# Patient Record
Sex: Female | Born: 1950 | Race: White | Hispanic: No | Marital: Married | Smoking: Never smoker
Health system: Southern US, Community
[De-identification: ages and names within clinical notes are randomized; demographics above are authoritative.]

## PROBLEM LIST (undated history)

## (undated) DIAGNOSIS — E785 Hyperlipidemia, unspecified: Secondary | ICD-10-CM

## (undated) DIAGNOSIS — H269 Unspecified cataract: Secondary | ICD-10-CM

## (undated) DIAGNOSIS — I10 Essential (primary) hypertension: Secondary | ICD-10-CM

## (undated) DIAGNOSIS — M1990 Unspecified osteoarthritis, unspecified site: Secondary | ICD-10-CM

## (undated) DIAGNOSIS — R3915 Urgency of urination: Secondary | ICD-10-CM

## (undated) DIAGNOSIS — E0500 Thyrotoxicosis with diffuse goiter without thyrotoxic crisis or storm: Secondary | ICD-10-CM

## (undated) DIAGNOSIS — K5792 Diverticulitis of intestine, part unspecified, without perforation or abscess without bleeding: Secondary | ICD-10-CM

## (undated) DIAGNOSIS — J302 Other seasonal allergic rhinitis: Secondary | ICD-10-CM

## (undated) DIAGNOSIS — C801 Malignant (primary) neoplasm, unspecified: Secondary | ICD-10-CM

## (undated) HISTORY — DX: Thyrotoxicosis with diffuse goiter without thyrotoxic crisis or storm: E05.00

## (undated) HISTORY — DX: Unspecified osteoarthritis, unspecified site: M19.90

## (undated) HISTORY — DX: Unspecified cataract: H26.9

## (undated) HISTORY — DX: Hyperlipidemia, unspecified: E78.5

## (undated) HISTORY — DX: Essential (primary) hypertension: I10

## (undated) HISTORY — DX: Urgency of urination: R39.15

## (undated) HISTORY — DX: Other seasonal allergic rhinitis: J30.2

## (undated) HISTORY — PX: CATARACT EXTRACTION, BILATERAL: SHX1313

---
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
## 2000-06-20 ENCOUNTER — Encounter: Admission: RE | Admit: 2000-06-20 | Discharge: 2000-06-20 | Payer: Self-pay | Admitting: Obstetrics and Gynecology

## 2000-06-20 ENCOUNTER — Encounter: Payer: Self-pay | Admitting: Obstetrics and Gynecology

## 2001-07-05 ENCOUNTER — Encounter: Admission: RE | Admit: 2001-07-05 | Discharge: 2001-07-05 | Payer: Self-pay | Admitting: Obstetrics and Gynecology

## 2001-07-05 ENCOUNTER — Encounter: Payer: Self-pay | Admitting: Obstetrics and Gynecology

## 2002-07-09 ENCOUNTER — Encounter: Admission: RE | Admit: 2002-07-09 | Discharge: 2002-07-09 | Payer: Self-pay | Admitting: Obstetrics and Gynecology

## 2002-07-09 ENCOUNTER — Encounter: Payer: Self-pay | Admitting: Obstetrics and Gynecology

## 2003-07-22 ENCOUNTER — Encounter: Admission: RE | Admit: 2003-07-22 | Discharge: 2003-07-22 | Payer: Self-pay | Admitting: Obstetrics and Gynecology

## 2003-07-22 ENCOUNTER — Encounter: Payer: Self-pay | Admitting: Obstetrics and Gynecology

## 2003-09-23 ENCOUNTER — Ambulatory Visit (HOSPITAL_COMMUNITY): Admission: RE | Admit: 2003-09-23 | Discharge: 2003-09-23 | Payer: Self-pay | Admitting: Gastroenterology

## 2009-12-04 HISTORY — PX: CERVICAL BIOPSY  W/ LOOP ELECTRODE EXCISION: SUR135

## 2015-12-05 HISTORY — PX: COLONOSCOPY: SHX174

## 2015-12-05 HISTORY — PX: POLYPECTOMY: SHX149

## 2018-09-09 DIAGNOSIS — Z8619 Personal history of other infectious and parasitic diseases: Secondary | ICD-10-CM | POA: Insufficient documentation

## 2019-11-05 ENCOUNTER — Encounter: Payer: Self-pay | Admitting: Family Medicine

## 2019-11-05 ENCOUNTER — Other Ambulatory Visit: Payer: Self-pay

## 2019-11-05 ENCOUNTER — Ambulatory Visit (INDEPENDENT_AMBULATORY_CARE_PROVIDER_SITE_OTHER): Payer: Medicare Other | Admitting: Family Medicine

## 2019-11-05 VITALS — BP 130/74 | HR 74 | Temp 98.1°F | Ht 69.0 in | Wt 159.1 lb

## 2019-11-05 DIAGNOSIS — R319 Hematuria, unspecified: Secondary | ICD-10-CM | POA: Diagnosis not present

## 2019-11-05 DIAGNOSIS — E785 Hyperlipidemia, unspecified: Secondary | ICD-10-CM

## 2019-11-05 DIAGNOSIS — Z8639 Personal history of other endocrine, nutritional and metabolic disease: Secondary | ICD-10-CM

## 2019-11-05 DIAGNOSIS — R300 Dysuria: Secondary | ICD-10-CM

## 2019-11-05 DIAGNOSIS — I10 Essential (primary) hypertension: Secondary | ICD-10-CM

## 2019-11-05 DIAGNOSIS — R109 Unspecified abdominal pain: Secondary | ICD-10-CM

## 2019-11-05 DIAGNOSIS — R3915 Urgency of urination: Secondary | ICD-10-CM

## 2019-11-05 LAB — POCT URINALYSIS DIPSTICK
Bilirubin, UA: NEGATIVE
Blood, UA: POSITIVE
Glucose, UA: NEGATIVE
Ketones, UA: NEGATIVE
Nitrite, UA: NEGATIVE
Protein, UA: NEGATIVE
Spec Grav, UA: 1.01 (ref 1.010–1.025)
Urobilinogen, UA: 0.2 E.U./dL
pH, UA: 6.5 (ref 5.0–8.0)

## 2019-11-05 MED ORDER — MYRBETRIQ 50 MG PO TB24: 50.0000 mg | ORAL_TABLET | Freq: Every day | ORAL | 11 refills | Status: DC

## 2019-11-05 NOTE — Progress Notes (Signed)
Subjective:     Patient ID: Caitlin Bowers, female   DOB: [birth year], 68 y.o.   MRN: [id number]  HPI   Ms. Macaraeg is seen with acute issue of left flank/lower lumbar pain.  This started sometime over the weekend.  There was no injury.  She has a achy pain left flank which is worse with lying and changing positions and has been waking her some at night.  7 out of 10 in severity at its worse.  She denies any past history of back difficulties.  Is not aware of any recent injury.  No radiculitis symptoms.  Pain starts in the left flank does not radiate anteriorly.  She denies any urinary symptoms.  No cough.  No fevers or chills.  No lower extremity weakness.  No gross hematuria.  No burning with urination.  No hx of kidney stones.   She took some Tylenol with mild relief.  Has not tried any heat or ice.  She just moved here recently from [facility].  They also had another home in [place].  She had planned to establish care here in March when she is due for her yearly physical.  She has history of hypertension, urinary urgency, hyperlipidemia, and past history of Graves' disease.  Her Graves' disease was treated medically several years ago.  Past Medical History:  Diagnosis Date  . Graves disease    Past Surgical History:  Procedure Laterality Date  . CESAREAN SECTION     1979  . TONSILLECTOMY  1957    reports that she has never smoked. She has never used smokeless tobacco. She reports current alcohol use of about 3.0 standard drinks of alcohol per week. She reports that she does not use drugs. family history is not on file. Allergies  Allergen Reactions  . Penicillins Hives  . Sulfa Antibiotics Hives     Review of Systems  Constitutional: Negative for appetite change, chills, fever and unexpected weight change.  Respiratory: Negative for cough.   Cardiovascular: Negative for chest pain.  Gastrointestinal: Negative for abdominal pain.  Genitourinary: Positive for flank pain.  Negative for difficulty urinating, dysuria and hematuria.  Musculoskeletal: Positive for back pain.  Neurological: Negative for weakness and numbness.       Objective:   Physical Exam Vitals signs reviewed.  Constitutional:      Appearance: Normal appearance.  Cardiovascular:     Rate and Rhythm: Normal rate and regular rhythm.  Pulmonary:     Effort: Pulmonary effort is normal.     Breath sounds: Normal breath sounds.  Abdominal:     General: Bowel sounds are normal.     Palpations: Abdomen is soft. There is no mass.     Tenderness: There is no abdominal tenderness. There is no guarding or rebound.  Musculoskeletal:     Right lower leg: No edema.     Left lower leg: No edema.     Comments: She has some mild tenderness left lower lumbar region.  Pain is exacerbated with lying to supine change in position  Neurological:     General: No focal deficit present.     Mental Status: She is alert.     Motor: No weakness.        Assessment:     #1 Acute left lumbar back pain.  Worse with position change.  Suspect musculoskeletal origin.  #2 urine dip does show small leukocytes and positive blood of uncertain significance.  Doubt infection or kidney stone clinically.  Plan:     -Check urine dipstick (as above) -urine sent for culture and microscopy -consider OTC Advil of Aleve for back pain -try topical heat -consider cautious use of low dose muscle relaxer if symptoms persist. -she plans to establish with her new primary in a few months.   -refilled Myrbetriq until she sees new primary.  Eulas Post MD [facility] at [facility]

## 2019-11-05 NOTE — Patient Instructions (Signed)
Acute Back Pain, Adult Acute back pain is sudden and usually short-lived. It is often caused by an injury to the muscles and tissues in the back. The injury may result from:  A muscle or ligament getting overstretched or torn (strained). Ligaments are tissues that connect bones to each other. Lifting something improperly can cause a back strain.  Wear and tear (degeneration) of the spinal disks. Spinal disks are circular tissue that provides cushioning between the bones of the spine (vertebrae).  Twisting motions, such as while playing sports or doing yard work.  A hit to the back.  Arthritis. You may have a physical exam, lab tests, and imaging tests to find the cause of your pain. Acute back pain usually goes away with rest and home care. Follow these instructions at home: Managing pain, stiffness, and swelling  Take over-the-counter and prescription medicines only as told by your health care provider.  Your health care provider may recommend applying ice during the first 24-48 hours after your pain starts. To do this: ? Put ice in a plastic bag. ? Place a towel between your skin and the bag. ? Leave the ice on for 20 minutes, 2-3 times a day.  If directed, apply heat to the affected area as often as told by your health care provider. Use the heat source that your health care provider recommends, such as a moist heat pack or a heating pad. ? Place a towel between your skin and the heat source. ? Leave the heat on for 20-30 minutes. ? Remove the heat if your skin turns bright red. This is especially important if you are unable to feel pain, heat, or cold. You have a greater risk of getting burned. Activity   Do not stay in bed. Staying in bed for more than 1-2 days can delay your recovery.  Sit up and stand up straight. Avoid leaning forward when you sit, or hunching over when you stand. ? If you work at a desk, sit close to it so you do not need to lean over. Keep your chin tucked  in. Keep your neck drawn back, and keep your elbows bent at a right angle. Your arms should look like the letter "L." ? Sit high and close to the steering wheel when you drive. Add lower back (lumbar) support to your car seat, if needed.  Take short walks on even surfaces as soon as you are able. Try to increase the length of time you walk each day.  Do not sit, drive, or stand in one place for more than 30 minutes at a time. Sitting or standing for long periods of time can put stress on your back.  Do not drive or use heavy machinery while taking prescription pain medicine.  Use proper lifting techniques. When you bend and lift, use positions that put less stress on your back: ? Bend your knees. ? Keep the load close to your body. ? Avoid twisting.  Exercise regularly as told by your health care provider. Exercising helps your back heal faster and helps prevent back injuries by keeping muscles strong and flexible.  Work with a physical therapist to make a safe exercise program, as recommended by your health care provider. Do any exercises as told by your physical therapist. Lifestyle  Maintain a healthy weight. Extra weight puts stress on your back and makes it difficult to have good posture.  Avoid activities or situations that make you feel anxious or stressed. Stress and anxiety increase muscle   tension and can make back pain worse. Learn ways to manage anxiety and stress, such as through exercise. General instructions  Sleep on a firm mattress in a comfortable position. Try lying on your side with your knees slightly bent. If you lie on your back, put a pillow under your knees.  Follow your treatment plan as told by your health care provider. This may include: ? Cognitive or behavioral therapy. ? Acupuncture or massage therapy. ? Meditation or yoga. Contact a health care provider if:  You have pain that is not relieved with rest or medicine.  You have increasing pain going down  into your legs or buttocks.  Your pain does not improve after 2 weeks.  You have pain at night.  You lose weight without trying.  You have a fever or chills. Get help right away if:  You develop new bowel or bladder control problems.  You have unusual weakness or numbness in your arms or legs.  You develop nausea or vomiting.  You develop abdominal pain.  You feel faint. Summary  Acute back pain is sudden and usually short-lived.  Use proper lifting techniques. When you bend and lift, use positions that put less stress on your back.  Take over-the-counter and prescription medicines and apply heat or ice as directed by your health care provider. This information is not intended to replace advice given to you by your health care provider. Make sure you discuss any questions you have with your health care provider. Document Released: 11/20/2005 Document Revised: 03/11/2019 Document Reviewed: 07/04/2017 Elsevier Patient Education  [employer].  Try some Advil or Aleve and heat  We are sending urine for microscopy and culture  Let me know if you have any fever, gross hematuria, or burning with urination.

## 2019-11-06 LAB — URINALYSIS, ROUTINE W REFLEX MICROSCOPIC
Bilirubin Urine: NEGATIVE
Ketones, ur: NEGATIVE
Nitrite: NEGATIVE
Specific Gravity, Urine: 1.01 (ref 1.000–1.030)
Total Protein, Urine: NEGATIVE
Urine Glucose: NEGATIVE
Urobilinogen, UA: 0.2 (ref 0.0–1.0)
pH: 7 (ref 5.0–8.0)

## 2019-11-07 LAB — URINE CULTURE
MICRO NUMBER:: 1155661
SPECIMEN QUALITY:: ADEQUATE

## 2019-11-11 ENCOUNTER — Other Ambulatory Visit: Payer: Self-pay

## 2019-11-11 MED ORDER — METHOCARBAMOL 500 MG PO TABS: 500.0000 mg | ORAL_TABLET | Freq: Four times a day (QID) | ORAL | 0 refills | Status: DC | PRN

## 2019-11-18 ENCOUNTER — Other Ambulatory Visit: Payer: Self-pay

## 2019-11-18 ENCOUNTER — Telehealth (INDEPENDENT_AMBULATORY_CARE_PROVIDER_SITE_OTHER): Payer: Medicare Other | Admitting: Family Medicine

## 2019-11-18 ENCOUNTER — Telehealth: Payer: Self-pay

## 2019-11-18 ENCOUNTER — Encounter: Payer: Self-pay | Admitting: Family Medicine

## 2019-11-18 DIAGNOSIS — R109 Unspecified abdominal pain: Secondary | ICD-10-CM

## 2019-11-18 NOTE — Telephone Encounter (Signed)
Spoke to Caitlin Bowers and she stated she has a lot of back pain. Nausea, abdominal pain and nasal congestion. Sx started 2 weeks ago. Just noticed that pt do not have a PCP listed. Pt stated she was suppose to have had a CPE and establish care in March but pt stated she got sick before then. Pt stated she saw Dr.Burchette but never seen Dr.Koberlien. Pt advised that I will call back to see if appt. Is appropriate.

## 2019-11-18 NOTE — Telephone Encounter (Signed)
Spoke to Glass blower/designer and advised that pt can do virtual with Dr.Kim. Pt notified of update.

## 2019-11-18 NOTE — Telephone Encounter (Signed)
Copied from [place] [id number]. Topic: Appointment Scheduling - Prior Auth Required for Appointment >> Nov 18, 2019  9:00 AM Robina Ade, Helene Kelp D wrote: No appointment has been scheduled. Patient is requesting work-in appointment for back pain. Per scheduling protocol, this appointment requires a prior authorization prior to scheduling.  Route to department's PEC pool.

## 2019-11-18 NOTE — Telephone Encounter (Signed)
Pt has been scheduled.  °

## 2019-11-18 NOTE — Telephone Encounter (Signed)
Copied from [place] [phone]. Topic: Appointment Scheduling - Prior Auth Required for Appointment >> Nov 18, 2019  9:00 AM Robina Ade, Helene Kelp D wrote: No appointment has been scheduled. Patient is requesting work-in appointment for back pain. Per scheduling protocol, this appointment requires a prior authorization prior to scheduling.  Route to department's PEC pool.

## 2019-11-18 NOTE — Telephone Encounter (Signed)
Copied from [place] [id number]. Topic: Appointment Scheduling - Prior Auth Required for Appointment >> Nov 18, 2019  9:00 AM Robina Ade, Helene Kelp D wrote: No appointment has been scheduled. Patient is requesting work-in appointment for back pain. Per scheduling protocol, this appointment requires a prior authorization prior to scheduling.  Route to department's PEC pool.

## 2019-11-18 NOTE — Progress Notes (Signed)
Virtual Visit via Video Note  I connected with Caitlin Bowers  on 11/18/19 at 12:00 PM EST by a video enabled telemedicine application and verified that I am speaking with the correct person using two identifiers.  Location patient: home Location provider:work or home office Persons participating in the virtual visit: patient, provider  I discussed the limitations of evaluation and management by telemedicine and the availability of in person appointments. The patient expressed understanding and agreed to proceed.   HPI:  Acute visit for L flank pain: -started 2.5 weeks ago -she has a PCP appt coming up -this is intermittent, severe at times - so bad at times that she can't eat, sometimes pain is so bad it is accompanied by nausea -worse with sitting, better with standing -denies pain with urination, gross hematuria, fevers, bowel changes, diarrhea, vomiting, constipation, history of kidney stones, resp symptoms, cough -was seen by another provider and has tried nsaids and muscle relaxer which she reports have not touched the pain -she is worried she needs to be seen again and get labs and maybe imaging  ROS: See pertinent positives and negatives per HPI.  Past Medical History:  Diagnosis Date  . Graves disease   . Hyperlipidemia   . Hypertension   . Urgency of urination     Past Surgical History:  Procedure Laterality Date  . CESAREAN SECTION     1979  . TONSILLECTOMY  1957    Family History  Adopted: Yes  Family history unknown: Yes    SOCIAL HX: see hpi   Current Outpatient Medications:  .  atenolol (TENORMIN) 25 MG tablet, , Disp: , Rfl:  .  atorvastatin (LIPITOR) 40 MG tablet, Take 40 mg by mouth at bedtime., Disp: , Rfl:  .  Calcium Carb-Cholecalciferol (CALCIUM 1000 + D PO), Take 2 tablets by mouth daily., Disp: , Rfl:  .  estradiol (ESTRACE) 0.1 MG/GM vaginal cream, Place 1 Applicatorful vaginally 3 (three) times a week., Disp: , Rfl:  .  GLUCOSAMINE-CHONDROITIN  PO, Take 2 capsules by mouth daily. 750-100//125-1.65, Disp: , Rfl:  .  methocarbamol (ROBAXIN) 500 MG tablet, Take 1 tablet (500 mg total) by mouth every 6 (six) hours as needed for muscle spasms (and back pain)., Disp: 30 tablet, Rfl: 0 .  Methylcellulose, Laxative, (CITRUCEL) 500 MG TABS, Take 2,000 mg by mouth daily., Disp: , Rfl:  .  MYRBETRIQ 50 MG TB24 tablet, Take 1 tablet (50 mg total) by mouth daily., Disp: 30 tablet, Rfl: 11 .  triamterene-hydrochlorothiazide (MAXZIDE-25) 37.5-25 MG tablet, Take 1 tablet by mouth daily., Disp: , Rfl:   EXAM:  VITALS per patient if applicable:denies fever  GENERAL: alert, oriented, appears well and in no acute distress  HEENT: atraumatic, conjunttiva clear, no obvious abnormalities on inspection of external nose and ears  NECK: normal movements of the head and neck  LUNGS: on inspection no signs of respiratory distress, breathing rate appears normal, no obvious gross SOB, gasping or wheezing  CV: no obvious cyanosis  ABD: points to focal area L lateral flank as area of concern and denies pain with self palpitations here  MS: moves all visible extremities without noticeable abnormality  PSYCH/NEURO: pleasant and cooperative, no obvious depression or anxiety, speech and thought processing grossly intact  ASSESSMENT AND PLAN:  Discussed the following assessment and plan:  Flank pain  -we discussed possible serious and likely etiologies, options for evaluation and workup, limitations of telemedicine visit vs in person visit, treatment, treatment risks and precautions. I feel  that this patient needs an in person visit for an exam, labs and possible CT abd/pelvis for evaluation for  possible urological vs other intra-abd pathology given ongoing and sometimes severe focal pain. She prefers that. I reached out to our practice admin to facilitate and in office visit.    I discussed the assessment and treatment plan with the patient. The patient was  provided an opportunity to ask questions and all were answered. The patient agreed with the plan and demonstrated an understanding of the instructions.   The patient was advised to call back or seek an in-person evaluation if the symptoms worsen or if the condition fails to improve as anticipated.   Lucretia Kern, DO

## 2019-11-19 ENCOUNTER — Other Ambulatory Visit: Payer: Self-pay

## 2019-11-19 ENCOUNTER — Ambulatory Visit (INDEPENDENT_AMBULATORY_CARE_PROVIDER_SITE_OTHER): Payer: Medicare Other | Admitting: Family Medicine

## 2019-11-19 ENCOUNTER — Encounter: Payer: Self-pay | Admitting: Family Medicine

## 2019-11-19 VITALS — BP 122/78 | HR 77 | Temp 97.7°F | Ht 69.0 in | Wt 154.6 lb

## 2019-11-19 DIAGNOSIS — R109 Unspecified abdominal pain: Secondary | ICD-10-CM | POA: Diagnosis not present

## 2019-11-19 DIAGNOSIS — R634 Abnormal weight loss: Secondary | ICD-10-CM

## 2019-11-19 LAB — COMPREHENSIVE METABOLIC PANEL
ALT: 17 U/L (ref 0–35)
AST: 18 U/L (ref 0–37)
Albumin: 4.6 g/dL (ref 3.5–5.2)
Alkaline Phosphatase: 106 U/L (ref 39–117)
BUN: 27 mg/dL — ABNORMAL HIGH (ref 6–23)
CO2: 28 mEq/L (ref 19–32)
Calcium: 10.3 mg/dL (ref 8.4–10.5)
Chloride: 95 mEq/L — ABNORMAL LOW (ref 96–112)
Creatinine, Ser: 1.36 mg/dL — ABNORMAL HIGH (ref 0.40–1.20)
GFR: 38.39 mL/min — ABNORMAL LOW (ref >60.00)
Glucose, Bld: 110 mg/dL — ABNORMAL HIGH (ref 70–99)
Potassium: 3.7 mEq/L (ref 3.5–5.1)
Sodium: 132 mEq/L — ABNORMAL LOW (ref 135–145)
Total Bilirubin: 1 mg/dL (ref 0.2–1.2)
Total Protein: 7.1 g/dL (ref 6.0–8.3)

## 2019-11-19 LAB — CBC WITH DIFFERENTIAL/PLATELET
Basophils Absolute: 0 10*3/uL (ref 0.0–0.1)
Basophils Relative: 0.6 % (ref 0.0–3.0)
Eosinophils Absolute: 0.1 10*3/uL (ref 0.0–0.7)
Eosinophils Relative: 1.7 % (ref 0.0–5.0)
HCT: 38.3 % (ref 36.0–46.0)
Hemoglobin: 13.1 g/dL (ref 12.0–15.0)
Lymphocytes Relative: 33.9 % (ref 12.0–46.0)
Lymphs Abs: 2.4 10*3/uL (ref 0.7–4.0)
MCHC: 34.2 g/dL (ref 30.0–36.0)
MCV: 93.2 fl (ref 78.0–100.0)
Monocytes Absolute: 0.6 10*3/uL (ref 0.1–1.0)
Monocytes Relative: 8.1 % (ref 3.0–12.0)
Neutro Abs: 4 10*3/uL (ref 1.4–7.7)
Neutrophils Relative %: 55.7 % (ref 43.0–77.0)
Platelets: 247 10*3/uL (ref 150.0–400.0)
RBC: 4.11 Mil/uL (ref 3.87–5.11)
RDW: 12.7 % (ref 11.5–15.5)
WBC: 7.1 10*3/uL (ref 4.0–10.5)

## 2019-11-19 LAB — SEDIMENTATION RATE: Sed Rate: 11 mm/h (ref 0–30)

## 2019-11-19 NOTE — Patient Instructions (Signed)
We will set up CT abdomen and pelvis to further assess.

## 2019-11-19 NOTE — Progress Notes (Signed)
Subjective:     Patient ID: Caitlin Bowers, female   DOB: 05/09/1951, 68 y.o.   MRN: [id number]  HPI Caitlin Bowers is seen with persistent left flank pain.  Refer to note from 11/05/2019 for details.  She has had about 2-1/2 weeks now of achy discomfort left flank which is worse with position change.  We suspect musculoskeletal.  We perform urinalysis and urine microscopy did not show any significant red cells.  She had strep agalactiae from urine culture with low colony count and suspected contaminant.  She has no burning with urination at this time.  Her pain is essentially unchanged.  She had no fever.  No rashes.  No radiculitis symptoms.  Muscle relaxer did not help.  Heat helps slightly.  No weight loss over the past year but she does state her appetite is somewhat diminished-and she has lost 5 pounds since last visit.  Last colonoscopy 2017 with recommended 5-year follow-up.  No history of kidney stones.  No gross hematuria.  She had virtual follow-up yesterday and suggestion for further evaluation.  Past Medical History:  Diagnosis Date  . Graves disease   . Hyperlipidemia   . Hypertension   . Urgency of urination    Past Surgical History:  Procedure Laterality Date  . CESAREAN SECTION     1979  . TONSILLECTOMY  1957    reports that she has never smoked. She has never used smokeless tobacco. She reports current alcohol use of about 3.0 standard drinks of alcohol per week. She reports that she does not use drugs. She was adopted. Family history is unknown by patient. Allergies  Allergen Reactions  . Penicillins Hives  . Sulfa Antibiotics Hives   Wt Readings from Last 3 Encounters:  11/19/19 154 lb 9.6 oz (70.1 kg)  11/05/19 159 lb 1.6 oz (72.2 kg)     Review of Systems  Constitutional: Positive for appetite change. Negative for chills and fever.  Respiratory: Negative for cough and shortness of breath.   Cardiovascular: Negative for chest pain.  Gastrointestinal:  Positive for abdominal pain. Negative for abdominal distention, blood in stool, diarrhea, nausea and vomiting.  Genitourinary: Positive for flank pain. Negative for dysuria, hematuria, vaginal bleeding and vaginal pain.  Musculoskeletal: Positive for back pain.  Neurological: Negative for weakness and numbness.       Objective:   Physical Exam Vitals reviewed.  Constitutional:      Appearance: Normal appearance.  Cardiovascular:     Rate and Rhythm: Normal rate and regular rhythm.  Pulmonary:     Effort: Pulmonary effort is normal.     Breath sounds: Normal breath sounds.  Abdominal:     General: There is no distension.     Palpations: Abdomen is soft. There is no mass.     Tenderness: There is no guarding or rebound.     Hernia: No hernia is present.  Musculoskeletal:     Comments: She does have some left flank tenderness.  No rash.  No visible swelling.  Skin:    Findings: No rash.  Neurological:     Mental Status: She is alert.        Assessment:     Patient presents with almost 3-week history of persistent left flank pain.  This is a new pain that she has not had previously.  Recent urinalysis mostly unremarkable.  She has not gotten relief with conservative factors such as over-the-counter medications or muscle relaxer    Plan:     -  Recommend further evaluation with CBC, comprehensive metabolic panel, sed rate -Set up CT abdomen pelvis to further assess -Her colonoscopy is up-to-date as above -If all the above normal consider trial of physical therapy  Eulas Post MD [facility] at [facility]

## 2019-11-24 ENCOUNTER — Telehealth: Payer: Self-pay

## 2019-11-24 NOTE — Telephone Encounter (Signed)
Copied from [place] [id number]. Topic: General - Other >> Nov 24, 2019  9:22 AM Greggory Keen D wrote: Reason for CRM: pt called saying she has an appt Dec 30 for a CT of her back left flank.  She wants to know if there is anyway she can get this appt moved up to an earlier date  CB#  [phone]

## 2019-11-24 NOTE — Telephone Encounter (Signed)
Spoke to pt and advised that we have no control over scheduling in their office. Pt wanted to know if we could send the CT referral to another office that can see her sooner. Spoke to referral coordinator and she stated that due to pt insurance she can only go to [facility]. Pt verbalized understanding.

## 2019-11-26 ENCOUNTER — Telehealth: Payer: Self-pay

## 2019-11-26 NOTE — Telephone Encounter (Signed)
Please see message. °

## 2019-11-26 NOTE — Telephone Encounter (Signed)
If having constipation consider over-the-counter MiraLAX (if she has not already tried this).  If she is having any recurrent vomiting, bloody stools, fever, or severe progressive abdominal pain needs to go to the ER and not wait for CT scan next week.  Would prefer to try to avoid opioids for pain especially in view of her constipation.

## 2019-11-26 NOTE — Telephone Encounter (Signed)
Called patient and gave her the message from Dr. Elease Hashimoto. Patient verbalized an understanding. Patient stated her CT scan is next Wednesday.

## 2019-11-26 NOTE — Telephone Encounter (Signed)
Spoke to pt and she stated she saw Dr.Burchette for hip pain recently and was scheduled for CT. Pt stated she will get CT done in a week. Pt claimed that she is in pain and think she may be constipated as well. Pt today c/o left hip pain now that radiates to belly button.    Please advise if pt needs an appt.

## 2019-12-03 ENCOUNTER — Ambulatory Visit
Admission: RE | Admit: 2019-12-03 | Discharge: 2019-12-03 | Disposition: A | Discharge status: Home / Self Care | Payer: Medicare Other | Source: Ambulatory Visit | Attending: Family Medicine | Admitting: Family Medicine

## 2019-12-03 DIAGNOSIS — R109 Unspecified abdominal pain: Secondary | ICD-10-CM

## 2019-12-03 MED ORDER — IOPAMIDOL (ISOVUE-300) INJECTION 61%
100.0000 mL | Freq: Once | INTRAVENOUS | Status: AC | PRN
  Administered 2019-12-03: 100 mL via INTRAVENOUS

## 2019-12-04 MED ORDER — GABAPENTIN 100 MG PO CAPS: 100.0000 mg | ORAL_CAPSULE | Freq: Three times a day (TID) | ORAL | 3 refills | Status: DC

## 2019-12-04 NOTE — Addendum Note (Signed)
Addended by: Eulas Post on: 12/04/2019 11:10 AM   Modules accepted: Orders

## 2019-12-07 ENCOUNTER — Encounter: Payer: Self-pay | Admitting: Family Medicine

## 2019-12-07 DIAGNOSIS — M545 Low back pain, unspecified: Secondary | ICD-10-CM

## 2019-12-10 ENCOUNTER — Ambulatory Visit: Payer: Self-pay | Admitting: *Deleted

## 2019-12-10 MED ORDER — PREDNISONE 10 MG PO TABS: ORAL_TABLET | ORAL | 0 refills | Status: DC

## 2019-12-10 NOTE — Addendum Note (Signed)
Addended by: Eulas Post on: 12/10/2019 06:37 PM   Modules accepted: Orders

## 2019-12-10 NOTE — Telephone Encounter (Signed)
She called in c/o the gabapentin making her very dizzy.  She has titrated the dose up like Dr. Elease Hashimoto instructed.   She is taking 3 tablets 3 times a day for the last 4 days. When she got up to go to the bathroom last night is when she noticed the dizziness.   She needs assistance to ambulate because she is so dizzy.  She is wanting to know if Dr. Elease Hashimoto wants her to decrease the dose or stop it or what?  She was agreeable to me sending a high priority note and having someone call her back.  I sent my triage notes high priority to Dr. Erick Blinks office.  I attempted to call however did not get an answer.     Reason for Disposition . [1] Caller has URGENT medication question about med that PCP or specialist prescribed AND [2] triager unable to answer question    Gabapentin making her very dizzy  Answer Assessment - Initial Assessment Questions 1.   NAME of MEDICATION: "What medicine are you calling about?"     Gabapentin is making me dizzy.    I started it a week ago.   The dizziness started last night. 2.   QUESTION: "What is your question?"     The gabapentin is making me dizzy. 3.   PRESCRIBING HCP: "Who prescribed it?" Reason: if prescribed by specialist, call should be referred to that group.     Dr. Elease Hashimoto 4. SYMPTOMS: "Do you have any symptoms?"     I can't walk without help due to the dizziness.   5. SEVERITY: If symptoms are present, ask "Are they mild, moderate or severe?"     I noticed it when I got up to go to the bathroom in the middle of the night last night. 6.  PREGNANCY:  "Is there any chance that you are pregnant?" "When was your last menstrual period?"     N/A  Protocols used: MEDICATION QUESTION CALL-A-AH

## 2019-12-10 NOTE — Telephone Encounter (Addendum)
Please see message. Please advise.  Spoke with patient.  She had dizziness with higher dose gabapentin.  She reduce this to 200 mg 3 times a day and if necessary back to 100 mg 3 times a day gradually as instructed.  Will start prednisone taper to see if this helps with her pain.  She does have MRI set up but this is not till the 23rd of this month.  She knows to follow-up immediately for any urine or stool incontinence or any progressive weakness or other concerns

## 2019-12-16 ENCOUNTER — Other Ambulatory Visit: Payer: Self-pay | Admitting: Family Medicine

## 2019-12-16 ENCOUNTER — Other Ambulatory Visit: Payer: Self-pay

## 2019-12-16 ENCOUNTER — Ambulatory Visit
Admission: RE | Admit: 2019-12-16 | Discharge: 2019-12-16 | Disposition: A | Discharge status: Home / Self Care | Payer: Medicare Other | Source: Ambulatory Visit | Attending: Family Medicine | Admitting: Family Medicine

## 2019-12-16 DIAGNOSIS — M545 Low back pain, unspecified: Secondary | ICD-10-CM

## 2019-12-16 DIAGNOSIS — M5126 Other intervertebral disc displacement, lumbar region: Secondary | ICD-10-CM

## 2019-12-23 ENCOUNTER — Ambulatory Visit: Payer: Medicare Other | Attending: Internal Medicine

## 2019-12-23 DIAGNOSIS — Z23 Encounter for immunization: Secondary | ICD-10-CM

## 2019-12-23 NOTE — Progress Notes (Signed)
   Covid-19 Vaccination Clinic  Name:  Caitlin Bowers    MRN: [id number] DOB: November 28, 1951  12/23/2019  [name] was observed post Covid-19 immunization for 30 minutes based on pre-vaccination screening without incidence. She was provided with Vaccine Information Sheet and instruction to access the V-Safe system.   [name] was instructed to call 911 with any severe reactions post vaccine: Marland Kitchen Difficulty breathing  . Swelling of your face and throat  . A fast heartbeat  . A bad rash all over your body  . Dizziness and weakness    Immunizations Administered    Name Date Dose VIS Date Route   Pfizer COVID-19 Vaccine 12/23/2019 12:49 PM 0.3 mL 11/14/2019 Intramuscular   Manufacturer: [place]   Lot: [id number]   [place]: [id number]

## 2019-12-27 ENCOUNTER — Other Ambulatory Visit: Payer: Medicare Other

## 2020-01-12 ENCOUNTER — Ambulatory Visit: Payer: Medicare Other | Attending: Internal Medicine

## 2020-01-12 DIAGNOSIS — Z23 Encounter for immunization: Secondary | ICD-10-CM | POA: Insufficient documentation

## 2020-01-12 NOTE — Progress Notes (Signed)
   Covid-19 Vaccination Clinic  Name:  Venetta Perkett    MRN: [id number] DOB: October 31, 1951  01/12/2020  Ms. Mitcham was observed post Covid-19 immunization for 30 minutes based on pre-vaccination screening without incidence. She was provided with Vaccine Information Sheet and instruction to access the V-Safe system.   Ms. Spell was instructed to call 911 with any severe reactions post vaccine: Marland Kitchen Difficulty breathing  . Swelling of your face and throat  . A fast heartbeat  . A bad rash all over your body  . Dizziness and weakness    Immunizations Administered    Name Date Dose VIS Date Route   Pfizer COVID-19 Vaccine 01/12/2020 12:48 PM 0.3 mL 11/14/2019 Intramuscular   Manufacturer: [place]   Lot: [id number]   [place]: [id number]

## 2020-02-02 HISTORY — PX: CATARACT EXTRACTION, BILATERAL: SHX1313

## 2020-02-13 ENCOUNTER — Other Ambulatory Visit: Payer: Self-pay

## 2020-02-15 NOTE — Progress Notes (Signed)
Caitlin Bowers DOB: [birth year] Encounter date: 02/16/2020  This isa 69 y.o. female who presents to establish care. Chief Complaint  Patient presents with  . Establish Care    History of present illness:  No specific concerns today. Just getting over bulging disc - has had 2 spinal injections and spent whole month of Dec,Jan,Feb flat on back. Last week is first time she has been up since Thanksgiving. Pain is ok right now. Following with [facility] - has seen a couple of providers there. Althia Forts. If injections don't work she might need surgery. She hasn't been in therapy during this time. She will be in [place] all of next month so has interest in doing therapy there.   HTN: Not checking regularly at home. Takes atenolol because in 2015 she had Graves flare and it affected heart rate.   Graves Disease: has been stable with this. Was seeing endocrinology.   HL: lipitor 40mg  daily. No pains.   Moved from [place]. Had urologist there. Myrbetriq is working well for her to control symptoms. Has done well with this for 2 years $400/mo but worth it. Was waking up 7 times/night; now able to sleep through night.   Was seeing dermatologist. Was following for regular skin checks. Some precancerous but no cancer.   Needs mammogram. Last mammogram was 10/2018. birads 1  Has had bone density:  07/2017: Frax 10 year major: 8.3% and 10 year hip frx 1%. Osteopenia. Improved from previous.   Colonoscopy last completed 07/2016 and is due for repeat in 5 years. Multiple polyps.   Last pap smear was last year and it was normal. Has had abnormal in past; doesn't remember what it was but had in office ?cone biopsy and was normal after. 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 Last pap negative, some atrophy. hpv negative this time.  BUN 19 creat 1.11 on bmp from 02/2019  Has had shingles, Tdap, pneumonia.  Tdap done 02/14/17   Past Medical History:  Diagnosis Date  . Graves disease   .  Hyperlipidemia   . Hypertension   . Urgency of urination    Past Surgical History:  Procedure Laterality Date  . CESAREAN SECTION     1979  . TONSILLECTOMY  1957   Allergies  Allergen Reactions  . Penicillins Hives  . Sulfa Antibiotics Hives   Current Meds  Medication Sig  . atenolol (TENORMIN) 25 MG tablet Take 1 tablet (25 mg total) by mouth daily.  Marland Kitchen atorvastatin (LIPITOR) 40 MG tablet Take 1 tablet (40 mg total) by mouth at bedtime.  . Calcium Carb-Cholecalciferol (CALCIUM 1000 + D PO) Take 2 tablets by mouth daily.  Marland Kitchen estradiol (ESTRACE) 0.1 MG/GM vaginal cream Place 1 Applicatorful vaginally 3 (three) times a week.  Marland Kitchen GLUCOSAMINE-CHONDROITIN PO Take 2 capsules by mouth daily. 750-100//125-1.65  . Methylcellulose, Laxative, (CITRUCEL) 500 MG TABS Take 2,000 mg by mouth daily.  Marland Kitchen MYRBETRIQ 50 MG TB24 tablet Take 1 tablet (50 mg total) by mouth daily.  Marland Kitchen triamterene-hydrochlorothiazide (MAXZIDE-25) 37.5-25 MG tablet Take 1 tablet by mouth daily.  . [DISCONTINUED] atenolol (TENORMIN) 25 MG tablet   . [DISCONTINUED] atorvastatin (LIPITOR) 40 MG tablet Take 40 mg by mouth at bedtime.  . [DISCONTINUED] estradiol (ESTRACE) 0.1 MG/GM vaginal cream Place 1 Applicatorful vaginally 3 (three) times a week.  . [DISCONTINUED] triamterene-hydrochlorothiazide (MAXZIDE-25) 37.5-25 MG tablet Take 1 tablet by mouth daily.   Social History   Tobacco Use  . Smoking status: Never Smoker  . Smokeless tobacco: Never  Used  Substance Use Topics  . Alcohol use: Yes    Alcohol/week: 3.0 standard drinks    Types: 3 Shots of liquor per week    Comment: per week   Family History  Adopted: Yes  Family history unknown: Yes     Review of Systems  Constitutional: Negative for chills, fatigue and fever.  Respiratory: Negative for cough, chest tightness, shortness of breath and wheezing.   Cardiovascular: Negative for chest pain, palpitations and leg swelling.  Musculoskeletal: Positive for back  pain (improved today).  Neurological: Weakness: feels like last few months have caused weakness.    Objective:  BP 100/68 (BP Location: Left Arm, Patient Position: Sitting, Cuff Size: Normal)   Pulse 79   Temp 97.6 F (36.4 C) (Temporal)   Ht 5\' 9"  (1.753 m)   Wt 161 lb 11.2 oz (73.3 kg)   BMI 23.88 kg/m   Weight: 161 lb 11.2 oz (73.3 kg)   BP Readings from Last 3 Encounters:  02/16/20 100/68  11/19/19 122/78  11/05/19 130/74   Wt Readings from Last 3 Encounters:  02/16/20 161 lb 11.2 oz (73.3 kg)  11/19/19 154 lb 9.6 oz (70.1 kg)  11/05/19 159 lb 1.6 oz (72.2 kg)    Physical Exam Constitutional:      General: She is not in acute distress.    Appearance: She is well-developed.  Cardiovascular:     Rate and Rhythm: Normal rate and regular rhythm.     Heart sounds: Normal heart sounds. No murmur. No friction rub.  Pulmonary:     Effort: Pulmonary effort is normal. No respiratory distress.     Breath sounds: Normal breath sounds. No wheezing or rales.  Musculoskeletal:     Right lower leg: No edema.     Left lower leg: No edema.  Neurological:     Mental Status: She is alert and oriented to person, place, and time.  Psychiatric:        Behavior: Behavior normal.     Assessment/Plan: 1. Thoracic disc herniation Following with specialist. I have encouraged her to call and ask if ok to start PT since she is feeling better.  I have put in referral for this.  She would like to complete in [place] where she will be staying for the month of April. - Ambulatory referral to Physical Therapy  2. Hypertension, unspecified type Well-controlled.  Continue current medications.  We will recheck blood work secondary to some electrolyte abnormalities on previous blood work. - Comprehensive metabolic panel; Future - Comprehensive metabolic panel - atenolol (TENORMIN) 25 MG tablet; Take 1 tablet (25 mg total) by mouth daily.  Dispense: 90 tablet; Refill: 1 -  triamterene-hydrochlorothiazide (MAXZIDE-25) 37.5-25 MG tablet; Take 1 tablet by mouth daily.  Dispense: 90 tablet; Refill: 1  3. Hyperlipidemia, unspecified hyperlipidemia type Continue with Lipitor. - Lipid panel; Future - Lipid panel - atorvastatin (LIPITOR) 40 MG tablet; Take 1 tablet (40 mg total) by mouth at bedtime.  Dispense: 90 tablet; Refill: 1  4. History of Graves' disease - TSH; Future - T4, free; Future - T3, free; Future - T3, free - T4, free - TSH  5. Encounter for screening mammogram for malignant neoplasm of breast - MM DIGITAL SCREENING BILATERAL; Future  6. Vitamin D deficiency - VITAMIN D 25 Hydroxy (Vit-D Deficiency, Fractures); Future - VITAMIN D 25 Hydroxy (Vit-D Deficiency, Fractures)  7. Encounter for hepatitis C screening test for low risk patient - Hepatitis C antibody; Future - Hepatitis C antibody  8. Urinary urgency Myrbetriq is very expensive, and she does not recall trying other medications.  I have sent in Ditropan for her to try.  We may need to work to adjust dose pending symptom level, so we will keep in touch regarding this. - oxybutynin (DITROPAN-XL) 10 MG 24 hr tablet; Take 1 tablet (10 mg total) by mouth at bedtime.  Dispense: 90 tablet; Refill: 1  9. Estrogen deficiency  - estradiol (ESTRACE) 0.1 MG/GM vaginal cream; Place 1 Applicatorful vaginally 3 (three) times a week.  Dispense: 42.5 g; Refill: 1  Return for pending bloodwork.  Micheline Rough, MD

## 2020-02-16 ENCOUNTER — Encounter: Payer: Self-pay | Admitting: Family Medicine

## 2020-02-16 ENCOUNTER — Ambulatory Visit (INDEPENDENT_AMBULATORY_CARE_PROVIDER_SITE_OTHER): Payer: Medicare Other | Admitting: Family Medicine

## 2020-02-16 ENCOUNTER — Other Ambulatory Visit: Payer: Self-pay

## 2020-02-16 VITALS — BP 100/68 | HR 79 | Temp 97.6°F | Ht 69.0 in | Wt 161.7 lb

## 2020-02-16 DIAGNOSIS — E559 Vitamin D deficiency, unspecified: Secondary | ICD-10-CM | POA: Diagnosis not present

## 2020-02-16 DIAGNOSIS — Z8639 Personal history of other endocrine, nutritional and metabolic disease: Secondary | ICD-10-CM

## 2020-02-16 DIAGNOSIS — M5124 Other intervertebral disc displacement, thoracic region: Secondary | ICD-10-CM

## 2020-02-16 DIAGNOSIS — Z1159 Encounter for screening for other viral diseases: Secondary | ICD-10-CM

## 2020-02-16 DIAGNOSIS — E785 Hyperlipidemia, unspecified: Secondary | ICD-10-CM | POA: Diagnosis not present

## 2020-02-16 DIAGNOSIS — E2839 Other primary ovarian failure: Secondary | ICD-10-CM

## 2020-02-16 DIAGNOSIS — R3915 Urgency of urination: Secondary | ICD-10-CM

## 2020-02-16 DIAGNOSIS — I10 Essential (primary) hypertension: Secondary | ICD-10-CM

## 2020-02-16 DIAGNOSIS — Z1231 Encounter for screening mammogram for malignant neoplasm of breast: Secondary | ICD-10-CM

## 2020-02-16 LAB — TSH: TSH: 1.06 u[IU]/mL (ref 0.35–4.50)

## 2020-02-16 LAB — COMPREHENSIVE METABOLIC PANEL
ALT: 20 U/L (ref 0–35)
AST: 19 U/L (ref 0–37)
Albumin: 4.1 g/dL (ref 3.5–5.2)
Alkaline Phosphatase: 98 U/L (ref 39–117)
BUN: 21 mg/dL (ref 6–23)
CO2: 28 mEq/L (ref 19–32)
Calcium: 9.8 mg/dL (ref 8.4–10.5)
Chloride: 99 mEq/L (ref 96–112)
Creatinine, Ser: 1.16 mg/dL (ref 0.40–1.20)
GFR: 46.1 mL/min — ABNORMAL LOW (ref >60.00)
Glucose, Bld: 97 mg/dL (ref 70–99)
Potassium: 4.4 mEq/L (ref 3.5–5.1)
Sodium: 135 mEq/L (ref 135–145)
Total Bilirubin: 0.9 mg/dL (ref 0.2–1.2)
Total Protein: 6.6 g/dL (ref 6.0–8.3)

## 2020-02-16 LAB — LIPID PANEL
Cholesterol: 193 mg/dL (ref 0–200)
HDL: 51.2 mg/dL (ref >39.00)
NonHDL: 141.52
Total CHOL/HDL Ratio: 4
Triglycerides: 212 mg/dL — ABNORMAL HIGH (ref 0.0–149.0)
VLDL: 42.4 mg/dL — ABNORMAL HIGH (ref 0.0–40.0)

## 2020-02-16 LAB — T4, FREE: Free T4: 0.87 ng/dL (ref 0.60–1.60)

## 2020-02-16 LAB — LDL CHOLESTEROL, DIRECT: Direct LDL: 108 mg/dL

## 2020-02-16 LAB — VITAMIN D 25 HYDROXY (VIT D DEFICIENCY, FRACTURES): VITD: 42.96 ng/mL (ref 30.00–100.00)

## 2020-02-16 LAB — T3, FREE: T3, Free: 3.1 pg/mL (ref 2.3–4.2)

## 2020-02-16 MED ORDER — ATORVASTATIN CALCIUM 40 MG PO TABS: 40.0000 mg | ORAL_TABLET | Freq: Every day | ORAL | 1 refills | Status: DC

## 2020-02-16 MED ORDER — ATENOLOL 25 MG PO TABS: 25.0000 mg | ORAL_TABLET | Freq: Every day | ORAL | 1 refills | Status: DC

## 2020-02-16 MED ORDER — ESTRADIOL 0.1 MG/GM VA CREA: 1.0000 | TOPICAL_CREAM | VAGINAL | 1 refills | Status: DC

## 2020-02-16 MED ORDER — OXYBUTYNIN CHLORIDE ER 10 MG PO TB24: 10.0000 mg | ORAL_TABLET | Freq: Every day | ORAL | 1 refills | Status: DC

## 2020-02-16 MED ORDER — TRIAMTERENE-HCTZ 37.5-25 MG PO TABS: 1.0000 | ORAL_TABLET | Freq: Every day | ORAL | 1 refills | Status: DC

## 2020-02-17 LAB — HEPATITIS C ANTIBODY
Hepatitis C Ab: NONREACTIVE
SIGNAL TO CUT-OFF: 0.01 (ref <1.00)

## 2020-02-23 ENCOUNTER — Encounter: Payer: Self-pay | Admitting: Family Medicine

## 2020-02-25 ENCOUNTER — Telehealth: Payer: Self-pay | Admitting: Family Medicine

## 2020-02-25 DIAGNOSIS — M5124 Other intervertebral disc displacement, thoracic region: Secondary | ICD-10-CM

## 2020-02-25 NOTE — Telephone Encounter (Signed)
Hina from [facility] stated that the pt wants to be seen by them however in order to do so her PCP will have to send a referral. She is scheduled for April 12th   [facility] Phone: [phone]  FAX: [phone]  [place]   [place]

## 2020-02-25 NOTE — Telephone Encounter (Signed)
Order re-entered with info as below.

## 2020-02-25 NOTE — Telephone Encounter (Signed)
I have already placed referral for this. Seems like we are having difficulty with processing this? Please OK the PT and let me know if more is needed so that she can complete this.

## 2020-02-27 NOTE — Telephone Encounter (Signed)
I  Called [facility] 3 times always get a voice message   [place]. [address] [phone]  I called the pt  To verify the  number  Pt  said she has been scheduled for  For 03/16/2020

## 2020-05-04 ENCOUNTER — Telehealth: Payer: Self-pay | Admitting: Family Medicine

## 2020-05-04 NOTE — Telephone Encounter (Signed)
Left a message for the pt to return my call.  

## 2020-05-04 NOTE — Telephone Encounter (Signed)
[place] for referral (although sounds like she wants to look them up so we can't put in referral until she decides who she wants to see). Can give her some of our most common referral names:  [facility] ([place]), [facility] ([place], [place]), [facility] ([place]).

## 2020-05-04 NOTE — Telephone Encounter (Signed)
Pt is returning your call. Thanks

## 2020-05-04 NOTE — Telephone Encounter (Signed)
I called the pt and gave her names and phone numbers for the offices below and the [facility].

## 2020-05-04 NOTE — Telephone Encounter (Signed)
Patient is requesting a referral for a dermatologist.  Patient is requesting a call back with phone numbers for several dermatologists that she can call herself.

## 2020-10-16 ENCOUNTER — Other Ambulatory Visit: Payer: Self-pay | Admitting: Family Medicine

## 2020-10-16 DIAGNOSIS — E2839 Other primary ovarian failure: Secondary | ICD-10-CM

## 2020-10-16 DIAGNOSIS — E785 Hyperlipidemia, unspecified: Secondary | ICD-10-CM

## 2020-10-16 DIAGNOSIS — I10 Essential (primary) hypertension: Secondary | ICD-10-CM

## 2020-10-18 NOTE — Telephone Encounter (Signed)
Ok for refill of myrbetriq if she is using this, but shouldn't be on oxybutynin and the myrbetriq. Please confirm that med list is up to date.

## 2020-10-20 NOTE — Telephone Encounter (Signed)
I verified the patients medication list. She is only taking  atenolol (TENORMIN) 25 MG tablet  atorvastatin (LIPITOR) 40 MG tablet  MYRBETRIQ 50 MG TB24 tablet  triamterene-hydrochlorothiazide (MAXZIDE-25) 37.5-25 MG tablet  She is no longer taking this medication and needs it removed from her medication list oxybutynin (DITROPAN-XL) 10 MG 24 hr tablet   She is also needing a new Rx sent in for MYRBETRIQ 50 MG TB24 tablet  To start in December

## 2020-10-20 NOTE — Telephone Encounter (Signed)
Left message on machine for patient to return our call 

## 2020-10-21 MED ORDER — ESTRADIOL 0.1 MG/GM VA CREA: 1.0000 | TOPICAL_CREAM | VAGINAL | 1 refills | Status: DC

## 2020-10-21 NOTE — Telephone Encounter (Signed)
Refills sent and med list updated

## 2020-11-15 ENCOUNTER — Other Ambulatory Visit: Payer: Self-pay | Admitting: Family Medicine

## 2020-12-09 ENCOUNTER — Encounter: Payer: Self-pay | Admitting: Family Medicine

## 2020-12-09 ENCOUNTER — Other Ambulatory Visit: Payer: Self-pay | Admitting: Family Medicine

## 2020-12-09 DIAGNOSIS — Z1231 Encounter for screening mammogram for malignant neoplasm of breast: Secondary | ICD-10-CM

## 2020-12-15 ENCOUNTER — Ambulatory Visit (INDEPENDENT_AMBULATORY_CARE_PROVIDER_SITE_OTHER): Payer: Medicare Other

## 2020-12-15 DIAGNOSIS — Z0000 Encounter for general adult medical examination without abnormal findings: Secondary | ICD-10-CM

## 2020-12-15 NOTE — Patient Instructions (Addendum)
Caitlin Bowers , Thank you for taking time to come for your Medicare Wellness Visit. I appreciate your ongoing commitment to your health goals. Please review the following plan we discussed and let me know if I can assist you in the future.   Screening recommendations/referrals: Colonoscopy: Up to date, next due 07/28/2021 Mammogram: Please keep upcoming scheduled appointment in March  Bone Density: Currently due, orders placed this visit  Recommended yearly ophthalmology/optometry visit for glaucoma screening and checkup Recommended yearly dental visit for hygiene and checkup  Vaccinations: Influenza vaccine: up to date, next due fall 2022  Pneumococcal vaccine: Completed series  Tdap vaccine: up to date, next due 02/15/2027 Shingles vaccine: completed series     Advanced directives: Advance directive discussed with you today. Even though you declined this today please call our office should you change your mind and we can give you the proper paperwork for you to fill out.   Conditions/risks identified: None   Next appointment: 02/18/2021 @ 8:00 am with Dr. Ethlyn Gallery   Preventive Care 65 Years and Older, Female Preventive care refers to lifestyle choices and visits with your health care provider that can promote health and wellness. What does preventive care include?  A yearly physical exam. This is also called an annual well check.  Dental exams once or twice a year.  Routine eye exams. Ask your health care provider how often you should have your eyes checked.  Personal lifestyle choices, including:  Daily care of your teeth and gums.  Regular physical activity.  Eating a healthy diet.  Avoiding tobacco and drug use.  Limiting alcohol use.  Practicing safe sex.  Taking low-dose aspirin every day.  Taking vitamin and mineral supplements as recommended by your health care provider. What happens during an annual well check? The services and screenings done by your  health care provider during your annual well check will depend on your age, overall health, lifestyle risk factors, and family history of disease. Counseling  Your health care provider may ask you questions about your:  Alcohol use.  Tobacco use.  Drug use.  Emotional well-being.  Home and relationship well-being.  Sexual activity.  Eating habits.  History of falls.  Memory and ability to understand (cognition).  Work and work Statistician.  Reproductive health. Screening  You may have the following tests or measurements:  Height, weight, and BMI.  Blood pressure.  Lipid and cholesterol levels. These may be checked every 5 years, or more frequently if you are over 47 years old.  Skin check.  Lung cancer screening. You may have this screening every year starting at age 44 if you have a 30-pack-year history of smoking and currently smoke or have quit within the past 15 years.  Fecal occult blood test (FOBT) of the stool. You may have this test every year starting at age 44.  Flexible sigmoidoscopy or colonoscopy. You may have a sigmoidoscopy every 5 years or a colonoscopy every 10 years starting at age 65.  Hepatitis C blood test.  Hepatitis B blood test.  Sexually transmitted disease (STD) testing.  Diabetes screening. This is done by checking your blood sugar (glucose) after you have not eaten for a while (fasting). You may have this done every 1-3 years.  Bone density scan. This is done to screen for osteoporosis. You may have this done starting at age 49.  Mammogram. This may be done every 1-2 years. Talk to your health care provider about how often you should have regular mammograms.  Talk with your health care provider about your test results, treatment options, and if necessary, the need for more tests. Vaccines  Your health care provider may recommend certain vaccines, such as:  Influenza vaccine. This is recommended every year.  Tetanus, diphtheria, and  acellular pertussis (Tdap, Td) vaccine. You may need a Td booster every 10 years.  Zoster vaccine. You may need this after age 73.  Pneumococcal 13-valent conjugate (PCV13) vaccine. One dose is recommended after age 100.  Pneumococcal polysaccharide (PPSV23) vaccine. One dose is recommended after age 66. Talk to your health care provider about which screenings and vaccines you need and how often you need them. This information is not intended to replace advice given to you by your health care provider. Make sure you discuss any questions you have with your health care provider. Document Released: 12/17/2015 Document Revised: 08/09/2016 Document Reviewed: 09/21/2015 Elsevier Interactive Patient Education  2017 [place] Prevention in the Home Falls can cause injuries. They can happen to people of all ages. There are many things you can do to make your home safe and to help prevent falls. What can I do on the outside of my home?  Regularly fix the edges of walkways and driveways and fix any cracks.  Remove anything that might make you trip as you walk through a door, such as a raised step or threshold.  Trim any bushes or trees on the path to your home.  Use bright outdoor lighting.  Clear any walking paths of anything that might make someone trip, such as rocks or tools.  Regularly check to see if handrails are loose or broken. Make sure that both sides of any steps have handrails.  Any raised decks and porches should have guardrails on the edges.  Have any leaves, snow, or ice cleared regularly.  Use sand or salt on walking paths during winter.  Clean up any spills in your garage right away. This includes oil or grease spills. What can I do in the bathroom?  Use night lights.  Install grab bars by the toilet and in the tub and shower. Do not use towel bars as grab bars.  Use non-skid mats or decals in the tub or shower.  If you need to sit down in the shower, use  a plastic, non-slip stool.  Keep the floor dry. Clean up any water that spills on the floor as soon as it happens.  Remove soap buildup in the tub or shower regularly.  Attach bath mats securely with double-sided non-slip rug tape.  Do not have throw rugs and other things on the floor that can make you trip. What can I do in the bedroom?  Use night lights.  Make sure that you have a light by your bed that is easy to reach.  Do not use any sheets or blankets that are too big for your bed. They should not hang down onto the floor.  Have a firm chair that has side arms. You can use this for support while you get dressed.  Do not have throw rugs and other things on the floor that can make you trip. What can I do in the kitchen?  Clean up any spills right away.  Avoid walking on wet floors.  Keep items that you use a lot in easy-to-reach places.  If you need to reach something above you, use a strong step stool that has a grab bar.  Keep electrical cords out of the way.  Do not use floor polish or wax that makes floors slippery. If you must use wax, use non-skid floor wax.  Do not have throw rugs and other things on the floor that can make you trip. What can I do with my stairs?  Do not leave any items on the stairs.  Make sure that there are handrails on both sides of the stairs and use them. Fix handrails that are broken or loose. Make sure that handrails are as long as the stairways.  Check any carpeting to make sure that it is firmly attached to the stairs. Fix any carpet that is loose or worn.  Avoid having throw rugs at the top or bottom of the stairs. If you do have throw rugs, attach them to the floor with carpet tape.  Make sure that you have a light switch at the top of the stairs and the bottom of the stairs. If you do not have them, ask someone to add them for you. What else can I do to help prevent falls?  Wear shoes that:  Do not have high heels.  Have  rubber bottoms.  Are comfortable and fit you well.  Are closed at the toe. Do not wear sandals.  If you use a stepladder:  Make sure that it is fully opened. Do not climb a closed stepladder.  Make sure that both sides of the stepladder are locked into place.  Ask someone to hold it for you, if possible.  Clearly mark and make sure that you can see:  Any grab bars or handrails.  First and last steps.  Where the edge of each step is.  Use tools that help you move around (mobility aids) if they are needed. These include:  Canes.  Walkers.  Scooters.  Crutches.  Turn on the lights when you go into a dark area. Replace any light bulbs as soon as they burn out.  Set up your furniture so you have a clear path. Avoid moving your furniture around.  If any of your floors are uneven, fix them.  If there are any pets around you, be aware of where they are.  Review your medicines with your doctor. Some medicines can make you feel dizzy. This can increase your chance of falling. Ask your doctor what other things that you can do to help prevent falls. This information is not intended to replace advice given to you by your health care provider. Make sure you discuss any questions you have with your health care provider. Document Released: 09/16/2009 Document Revised: 04/27/2016 Document Reviewed: 12/25/2014 Elsevier Interactive Patient Education  2017 [employer].

## 2020-12-15 NOTE — Progress Notes (Signed)
Subjective:   Caitlin Bowers is a 70 y.o. female who presents for an Initial Medicare Annual Wellness Visit.  Attempted to connect to virtual video visit multiple times and was unable to establish a connection. Visit changed to a telephone visit   I connected with Caitlin Bowers today by telephone and verified that I am speaking with the correct person using two identifiers. Location patient: home Location provider: work Persons participating in the virtual visit: patient, provider.   I discussed the limitations, risks, security and privacy concerns of performing an evaluation and management service by telephone and the availability of in person appointments. I also discussed with the patient that there may be a patient responsible charge related to this service. The patient expressed understanding and verbally consented to this telephonic visit.    Interactive audio and video telecommunications were attempted between this provider and patient, however failed, due to patient having technical difficulties OR patient did not have access to video capability.  We continued and completed visit with audio only.      Review of Systems    N/A  Cardiac Risk Factors include: advanced age (>64men, >69 women);hypertension;dyslipidemia     Objective:    There were no vitals filed for this visit. There is no height or weight on file to calculate BMI.  Advanced Directives 12/15/2020  Does Patient Have a Medical Advance Directive? No  Would patient like information on creating a medical advance directive? No - Patient declined    Current Medications (verified) Outpatient Encounter Medications as of 12/15/2020  Medication Sig  . atenolol (TENORMIN) 25 MG tablet TAKE 1 TABLET BY MOUTH EVERY DAY  . atorvastatin (LIPITOR) 40 MG tablet TAKE 1 TABLET BY MOUTH EVERYDAY AT BEDTIME  . Calcium Carb-Cholecalciferol (CALCIUM 1000 + D PO) Take 2 tablets by mouth daily.  Marland Kitchen estradiol (ESTRACE) 0.1  MG/GM vaginal cream Place 1 Applicatorful vaginally 3 (three) times a week.  Marland Kitchen GLUCOSAMINE-CHONDROITIN PO Take 2 capsules by mouth daily. 750-100//125-1.65  . Methylcellulose, Laxative, (CITRUCEL) 500 MG TABS Take 2,000 mg by mouth daily.  Marland Kitchen MYRBETRIQ 50 MG TB24 tablet TAKE 1 TABLET BY MOUTH EVERY DAY  . triamterene-hydrochlorothiazide (MAXZIDE-25) 37.5-25 MG tablet TAKE 1 TABLET BY MOUTH EVERY DAY   No facility-administered encounter medications on file as of 12/15/2020.    Allergies (verified) Penicillins and Sulfa antibiotics   History: Past Medical History:  Diagnosis Date  . Graves disease   . Hyperlipidemia   . Hypertension   . Urgency of urination    Past Surgical History:  Procedure Laterality Date  . CATARACT EXTRACTION, BILATERAL Bilateral   . CESAREAN SECTION     1979  . TONSILLECTOMY  1957   Family History  Adopted: Yes  Family history unknown: Yes   Social History   Socioeconomic History  . Marital status: Married    Spouse name: Not on file  . Number of children: Not on file  . Years of education: Not on file  . Highest education level: Not on file  Occupational History  . Not on file  Tobacco Use  . Smoking status: Never Smoker  . Smokeless tobacco: Never Used  Vaping Use  . Vaping Use: Never used  Substance and Sexual Activity  . Alcohol use: Yes    Alcohol/week: 3.0 standard drinks    Types: 3 Shots of liquor per week    Comment: per week  . Drug use: Never  . Sexual activity: Yes  Other Topics Concern  .  Not on file  Social History Narrative  . Not on file   Social Determinants of Health   Financial Resource Strain: Low Risk   . Difficulty of Paying Living Expenses: Not hard at all  Food Insecurity: No Food Insecurity  . Worried About Programme researcher, broadcasting/film/video in the Last Year: Never true  . Ran Out of Food in the Last Year: Never true  Transportation Needs: No Transportation Needs  . Lack of Transportation (Medical): No  . Lack of  Transportation (Non-Medical): No  Physical Activity: Sufficiently Active  . Days of Exercise per Week: 5 days  . Minutes of Exercise per Session: 40 min  Stress: No Stress Concern Present  . Feeling of Stress : Not at all  Social Connections: Moderately Isolated  . Frequency of Communication with Friends and Family: More than three times a week  . Frequency of Social Gatherings with Friends and Family: Once a week  . Attends Religious Services: Never  . Active Member of Clubs or Organizations: No  . Attends Banker Meetings: Never  . Marital Status: Married    Tobacco Counseling Counseling given: Not Answered   Clinical Intake:  Pre-visit preparation completed: Yes  Pain : No/denies pain     Nutritional Risks: None Diabetes: No  How often do you need to have someone help you when you read instructions, pamphlets, or other written materials from your doctor or pharmacy?: 1 - Never  Diabetic?No          Activities of Daily Living In your present state of health, do you have any difficulty performing the following activities: 12/15/2020  Hearing? N  Vision? N  Difficulty concentrating or making decisions? N  Walking or climbing stairs? N  Dressing or bathing? N  Doing errands, shopping? N  Preparing Food and eating ? N  Using the Toilet? N  In the past six months, have you accidently leaked urine? N  Do you have problems with loss of bowel control? N  Managing your Medications? N  Managing your Finances? N  Housekeeping or managing your Housekeeping? N  Some recent data might be hidden    Patient Care Team: Wynn Banker, MD as PCP - General (Family Medicine)  Indicate any recent Medical Services you may have received from other than [facility] providers in the past year (date may be approximate).     Assessment:   This is a routine wellness examination for Caitlin Bowers.  Hearing/Vision screen  Hearing Screening   125Hz  250Hz  500Hz  1000Hz  2000Hz   3000Hz  4000Hz  6000Hz  8000Hz   Right ear:           Left ear:           Vision Screening Comments: Patient states gets eyes examined once per year   Dietary issues and exercise activities discussed: Current Exercise Habits: Home exercise routine, Type of exercise: walking, Time (Minutes): 35, Frequency (Times/Week): 6, Weekly Exercise (Minutes/Week): 210, Intensity: Mild  Goals    . Exercise 150 min/wk Moderate Activity      Depression Screen PHQ 2/9 Scores 12/15/2020  PHQ - 2 Score 0  PHQ- 9 Score 0    Fall Risk Fall Risk  12/15/2020  Falls in the past year? 0  Number falls in past yr: 0  Injury with Fall? 0  Risk for fall due to : No Fall Risks  Follow up Falls evaluation completed;Falls prevention discussed    FALL RISK PREVENTION PERTAINING TO THE HOME:  Any stairs in or  around the home? Yes  If so, are there any without handrails? No  Home free of loose throw rugs in walkways, pet beds, electrical cords, etc? Yes  Adequate lighting in your home to reduce risk of falls? Yes   ASSISTIVE DEVICES UTILIZED TO PREVENT FALLS:  Life alert? No  Use of a cane, walker or w/c? No  Grab bars in the bathroom? No  Shower chair or bench in shower? No  Elevated toilet seat or a handicapped toilet? No    Cognitive Function:   Normal cognitive status assessed by direct observation by this Nurse Health Advisor. No abnormalities found.        Immunizations Immunization History  Administered Date(s) Administered  . Influenza, High Dose Seasonal PF 08/17/2019  . Influenza-Unspecified 10/23/2006, 10/08/2009, 09/13/2010, 08/30/2011, 09/03/2014, 08/07/2015, 03/04/2016, 08/04/2016, 09/03/2016, 07/30/2017, 08/10/2018, 08/11/2019, 08/06/2020  . PFIZER SARS-COV-2 Vaccination 12/23/2019, 01/12/2020, 08/11/2020  . Pneumococcal Conjugate-13 10/01/2014  . Pneumococcal Polysaccharide-23 02/04/2016  . Tdap 02/14/2017  . Zoster 08/31/2011  . Zoster Recombinat (Shingrix) 02/13/2018, 06/23/2018     TDAP status: Up to date  Flu Vaccine status: Up to date  Pneumococcal vaccine status: Up to date  Covid-19 vaccine status: Completed vaccines  Qualifies for Shingles Vaccine? Yes   Zostavax completed Yes   Shingrix Completed?: Yes  Screening Tests Health Maintenance  Topic Date Due  . MAMMOGRAM  01/04/2021  . COLONOSCOPY (Pts 45-107yrs Insurance coverage will need to be confirmed)  07/28/2021  . TETANUS/TDAP  02/15/2027  . INFLUENZA VACCINE  Completed  . DEXA SCAN  Completed  . COVID-19 Vaccine  Completed  . Hepatitis C Screening  Completed  . PNA vac Low Risk Adult  Completed    Health Maintenance  There are no preventive care reminders to display for this patient.  Colorectal cancer screening: Type of screening: Colonoscopy. Completed 07/28/2016. Repeat every 5 years  Mammogram status: Ordered 12/09/2020. Pt provided with contact info and advised to call to schedule appt.   Bone Density status: Ordered 12/15/2020. Pt provided with contact info and advised to call to schedule appt.  Lung Cancer Screening: (Low Dose CT Chest recommended if Age 8-80 years, 30 pack-year currently smoking OR have quit w/in 15years.) does not qualify.   Lung Cancer Screening Referral: N/A   Additional Screening:  Hepatitis C Screening: does qualify; Completed 02/16/2020  Vision Screening: Recommended annual ophthalmology exams for early detection of glaucoma and other disorders of the eye. Is the patient up to date with their annual eye exam?  Yes  Who is the provider or what is the name of the office in which the patient attends annual eye exams? [facility]  If pt is not established with a provider, would they like to be referred to a provider to establish care? No .   Dental Screening: Recommended annual dental exams for proper oral hygiene  Community Resource Referral / Chronic Care Management: CRR required this visit?  No   CCM required this visit?  No       Plan:     I have personally reviewed and noted the following in the patient's chart:   . Medical and social history . Use of alcohol, tobacco or illicit drugs  . Current medications and supplements . Functional ability and status . Nutritional status . Physical activity . Advanced directives . List of other physicians . Hospitalizations, surgeries, and ER visits in previous 12 months . Vitals . Screenings to include cognitive, depression, and falls . Referrals and appointments  In addition, I have reviewed and discussed with patient certain preventive protocols, quality metrics, and best practice recommendations. A written personalized care plan for preventive services as well as general preventive health recommendations were provided to patient.     Ofilia Neas, LPN   [id number]   Nurse Notes: None

## 2020-12-16 ENCOUNTER — Telehealth (INDEPENDENT_AMBULATORY_CARE_PROVIDER_SITE_OTHER): Payer: Medicare Other | Admitting: Internal Medicine

## 2020-12-16 ENCOUNTER — Encounter: Payer: Self-pay | Admitting: Internal Medicine

## 2020-12-16 DIAGNOSIS — J22 Unspecified acute lower respiratory infection: Secondary | ICD-10-CM

## 2020-12-16 MED ORDER — DOXYCYCLINE HYCLATE 100 MG PO TABS: 100.0000 mg | ORAL_TABLET | Freq: Two times a day (BID) | ORAL | 0 refills | Status: DC

## 2020-12-16 MED ORDER — BENZONATATE 100 MG PO CAPS
100.0000 mg | ORAL_CAPSULE | Freq: Three times a day (TID) | ORAL | 0 refills | Status: DC | PRN
Start: 2020-12-16 — End: 2020-12-23

## 2020-12-16 NOTE — Progress Notes (Signed)
Virtual Visit via Video Note  I connected with@ on 12/16/20 at 10:30 AM EST by a video enabled telemedicine application and verified that I am speaking with the correct person using two identifiers. Location patient: home Location provider: home office Persons participating in the virtual visit: patient, provider  [place] national recommendations  regarding COVID 19 pandemic   video visit is advised over in office visit for this patient.  Patient aware  of the limitations of evaluation and management by telemedicine and  availability of in person appointments. and agreed to proceed.   HPI: Caitlin Bowers presents for video visit  PCP appt NA  Onset last week.growth cough and after about 3 days developed fever chills 100.4 for the last 3 to 4 days.  Using OTC cough Robitussin no shortness of breath has had some phlegm that is clearing up no hemoptysis chest pain pleurisy symptoms.  She had a COVID test that was done and came back negative yesterday has been immunized booster in September.  Husband is not ill with the same thing Has no history of underlying illness known exposures to specific illness such as this. She was able to Bowers last night so may be getting a little better today.  Asked for advice.     ROS: See pertinent positives and negatives per HPI.  Past Medical History:  Diagnosis Date  . Graves disease   . Hyperlipidemia   . Hypertension   . Urgency of urination     Past Surgical History:  Procedure Laterality Date  . CATARACT EXTRACTION, BILATERAL Bilateral   . CESAREAN SECTION     1979  . TONSILLECTOMY  1957    Family History  Adopted: Yes  Family history unknown: Yes    Social History   Tobacco Use  . Smoking status: Never Smoker  . Smokeless tobacco: Never Used  Vaping Use  . Vaping Use: Never used  Substance Use Topics  . Alcohol use: Yes    Alcohol/week: 3.0 standard drinks    Types: 3 Shots of liquor per week    Comment: per week  .  Drug use: Never      Current Outpatient Medications:  .  atenolol (TENORMIN) 25 MG tablet, TAKE 1 TABLET BY MOUTH EVERY DAY, Disp: 90 tablet, Rfl: 0 .  atorvastatin (LIPITOR) 40 MG tablet, TAKE 1 TABLET BY MOUTH EVERYDAY AT BEDTIME, Disp: 90 tablet, Rfl: 0 .  benzonatate (TESSALON PERLES) 100 MG capsule, Take 1-2 capsules (100-200 mg total) by mouth 3 (three) times daily as needed for cough., Disp: 24 capsule, Rfl: 0 .  Calcium Carb-Cholecalciferol (CALCIUM 1000 + D PO), Take 2 tablets by mouth daily., Disp: , Rfl:  .  doxycycline (VIBRA-TABS) 100 MG tablet, Take 1 tablet (100 mg total) by mouth 2 (two) times daily., Disp: 14 tablet, Rfl: 0 .  estradiol (ESTRACE) 0.1 MG/GM vaginal cream, Place 1 Applicatorful vaginally 3 (three) times a week., Disp: 42.5 g, Rfl: 1 .  GLUCOSAMINE-CHONDROITIN PO, Take 2 capsules by mouth daily. 750-100//125-1.65, Disp: , Rfl:  .  Methylcellulose, Laxative, (CITRUCEL) 500 MG TABS, Take 2,000 mg by mouth daily., Disp: , Rfl:  .  MYRBETRIQ 50 MG TB24 tablet, TAKE 1 TABLET BY MOUTH EVERY DAY, Disp: 30 tablet, Rfl: 3 .  triamterene-hydrochlorothiazide (MAXZIDE-25) 37.5-25 MG tablet, TAKE 1 TABLET BY MOUTH EVERY DAY, Disp: 90 tablet, Rfl: 0  EXAM: BP Readings from Last 3 Encounters:  02/16/20 100/68  11/19/19 122/78  11/05/19 130/74    VITALS per  patient if applicable:  GENERAL: alert, oriented, appears well and in no acute distress nontoxic but somewhat congested and occasional deep bronchial cough.  Fits  HEENT: atraumatic, conjunttiva clear, no obvious abnormalities on inspection of external nose and ears  NECK: normal movements of the head and neck  LUNGS: on inspection no signs of respiratory distress, breathing rate appears normal, no obvious gross SOB, gasping or wheezing  CV: no obvious cyanosis  MS: moves all visible extremities without noticeable abnormality  PSYCH/NEURO: pleasant and cooperative, no obvious depression or anxiety, speech and  thought processing grossly intact Lab Results  Component Value Date   WBC 7.1 11/19/2019   HGB 13.1 11/19/2019   HCT 38.3 11/19/2019   PLT 247.0 11/19/2019   GLUCOSE 97 02/16/2020   CHOL 193 02/16/2020   TRIG 212.0 (H) 02/16/2020   HDL 51.20 02/16/2020   LDLDIRECT 108.0 02/16/2020   ALT 20 02/16/2020   AST 19 02/16/2020   NA 135 02/16/2020   K 4.4 02/16/2020   CL 99 02/16/2020   CREATININE 1.16 02/16/2020   BUN 21 02/16/2020   CO2 28 02/16/2020   TSH 1.06 02/16/2020    ASSESSMENT AND PLAN:  Discussed the following assessment and plan:    ICD-10-CM   1. Acute respiratory infection  J22    covid neg  poss flu other  prolonged fever    Acute respiratory illness presumed viral with bronchitis-like symptoms but persistent fever over the last 4 days.  Flu is a possibility expect improvement over the next 48 hours if not since coming into weekend and status of medical resources prescription for doxycycline sent in to hold on if not turning the corner in 648 hours or if worse contact for more advice or emergent care. Otherwise Mucinex DM and Tessalon Perles sent in to pharmacy. Counseled.    Shanon Ace, MD   Expectant management and discussion of plan and treatment with opportunity to ask questions and all were answered. The patient agreed with the plan and demonstrated an understanding of the instructions.   Advised to call back or seek an in-person evaluation if worsening  or having  further concerns . Return if symptoms worsen or fail to improve as expected.

## 2020-12-22 ENCOUNTER — Telehealth: Payer: Self-pay | Admitting: Family Medicine

## 2020-12-22 NOTE — Telephone Encounter (Signed)
Patient informed of the message below.  Patient denies a fever, states the cough is non-productive, denies shortness of breath and she feels the same.  Message sent to Dr Regis Bill.

## 2020-12-22 NOTE — Telephone Encounter (Signed)
Patient is calling and stated that she had a video visit with Dr. Regis Bill for cough and is still not feeling any better with the medication. Patient wanted to see if something else be called into the pharmacy, please advise. CB [phone]

## 2020-12-22 NOTE — Telephone Encounter (Signed)
Need update what her sx are such as  Fever  Cough   Dyspnea etc.   If she has fever or is worse may need to be seen in resp clinic  On Friday   Or tonight  If available  Otherwise   Need more info  ? Cough med or other  May need another video visit  ,

## 2020-12-23 MED ORDER — BENZONATATE 100 MG PO CAPS
100.0000 mg | ORAL_CAPSULE | Freq: Three times a day (TID) | ORAL | 0 refills | Status: DC | PRN
Start: 2020-12-23 — End: 2021-02-18

## 2020-12-23 NOTE — Telephone Encounter (Signed)
Patient informed of the message below.  Patient stated she still has a cough and is feeling better.  Per patients request, a refill was sent to the pharmacy and she agreed to call back next week if needed.

## 2020-12-23 NOTE — Telephone Encounter (Signed)
Left a message for the pt to return my call.  

## 2020-12-23 NOTE — Telephone Encounter (Signed)
So if fever is gone and no sob  Would continue  Supportive therapy (ok if you took antibiotic also ) Ok to refill the tessalon perles  If she wishes   If not improved after the weekend  Plan in person visit  Next week. With PCP  Or me  ( since Covid negative )

## 2020-12-28 ENCOUNTER — Encounter: Payer: Self-pay | Admitting: Family Medicine

## 2020-12-28 ENCOUNTER — Telehealth (INDEPENDENT_AMBULATORY_CARE_PROVIDER_SITE_OTHER): Payer: Medicare Other | Admitting: Family Medicine

## 2020-12-28 DIAGNOSIS — J019 Acute sinusitis, unspecified: Secondary | ICD-10-CM | POA: Diagnosis not present

## 2020-12-28 MED ORDER — LEVOFLOXACIN 750 MG PO TABS: 750.0000 mg | ORAL_TABLET | Freq: Every day | ORAL | 0 refills | Status: AC

## 2020-12-28 NOTE — Progress Notes (Signed)
Virtual Visit via Video Note  I connected with Caitlin Bowers   on 12/28/20 at  9:00 AM EST by a video enabled telemedicine application and verified that I am speaking with the correct person using two identifiers.  Location patient: home Location provider:work office Persons participating in the virtual visit: patient, provider  I discussed the limitations of evaluation and management by telemedicine and the availability of in person appointments. The patient expressed understanding and agreed to proceed.   Caitlin Bowers DOB: 11-May-1951 Encounter date: 12/28/2020  This is a 70 y.o. female who presents to establish care. Chief Complaint  Patient presents with  . Cough    Recurrent non-productive cough x3 weeks, feels head congestion is worse  . Fatigue    History of present illness: Previous with cough couldn't get anything up and was choking on it. This has improved a lot. Head congestion has not gotten better, but she is concerned because she is flying on Monday and going to be around 11 yr old brother in law. Has been 3 weeks this week; sometimes has moments where she feels better but then worse again. No fevers in last week. Not taking any medication now that is helping; is taking mucinex. Has tried decongestant without benefit. Just feels like she is in the bottom of the barrel. Bloody nasal discharge.   Not wheezing or short of breath.   No ear pain, but does have some pressure.    Past Medical History:  Diagnosis Date  . Graves disease   . Hyperlipidemia   . Hypertension   . Urgency of urination    Past Surgical History:  Procedure Laterality Date  . CATARACT EXTRACTION, BILATERAL Bilateral   . CESAREAN SECTION     1979  . TONSILLECTOMY  1957   Allergies  Allergen Reactions  . Penicillins Hives  . Sulfa Antibiotics Hives   Current Meds  Medication Sig  . atenolol (TENORMIN) 25 MG tablet TAKE 1 TABLET BY MOUTH EVERY DAY  . atorvastatin  (LIPITOR) 40 MG tablet TAKE 1 TABLET BY MOUTH EVERYDAY AT BEDTIME  . benzonatate (TESSALON PERLES) 100 MG capsule Take 1-2 capsules (100-200 mg total) by mouth 3 (three) times daily as needed for cough.  . Calcium Carb-Cholecalciferol (CALCIUM 1000 + D PO) Take 2 tablets by mouth daily.  Marland Kitchen estradiol (ESTRACE) 0.1 MG/GM vaginal cream Place 1 Applicatorful vaginally 3 (three) times a week.  Marland Kitchen GLUCOSAMINE-CHONDROITIN PO Take 2 capsules by mouth daily. 750-100//125-1.65  . levofloxacin (LEVAQUIN) 750 MG tablet Take 1 tablet (750 mg total) by mouth daily for 5 days.  . Methylcellulose, Laxative, (CITRUCEL) 500 MG TABS Take 2,000 mg by mouth daily.  Marland Kitchen MYRBETRIQ 50 MG TB24 tablet TAKE 1 TABLET BY MOUTH EVERY DAY  . triamterene-hydrochlorothiazide (MAXZIDE-25) 37.5-25 MG tablet TAKE 1 TABLET BY MOUTH EVERY DAY  . [DISCONTINUED] doxycycline (VIBRA-TABS) 100 MG tablet Take 1 tablet (100 mg total) by mouth 2 (two) times daily.   Social History   Tobacco Use  . Smoking status: Never Smoker  . Smokeless tobacco: Never Used  Substance Use Topics  . Alcohol use: Yes    Alcohol/week: 3.0 standard drinks    Types: 3 Shots of liquor per week    Comment: per week   Family History  Adopted: Yes  Family history unknown: Yes     Review of Systems  Constitutional: Positive for fatigue. Negative for chills and fever.  HENT: Positive for congestion, hearing loss (clogged ears), nosebleeds, postnasal drip,  sinus pressure, sinus pain and sore throat. Negative for ear pain.   Respiratory: Positive for cough. Negative for chest tightness, shortness of breath and wheezing.   Cardiovascular: Negative for chest pain.  Neurological: Positive for headaches.    Objective:  There were no vitals taken for this visit.      BP Readings from Last 3 Encounters:  02/16/20 100/68  11/19/19 122/78  11/05/19 130/74   Wt Readings from Last 3 Encounters:  02/16/20 161 lb 11.2 oz (73.3 kg)  11/19/19 154 lb 9.6 oz  (70.1 kg)  11/05/19 159 lb 1.6 oz (72.2 kg)    EXAM:  GENERAL: alert, oriented, appears sick, but in no acute distress.   HEENT: atraumatic, conjunctiva clear, no obvious abnormalities on inspection of external nose and ears  NECK: normal movements of the head and neck  LUNGS: on inspection no signs of respiratory distress, breathing rate appears normal, no obvious gross SOB, gasping or wheezing. She is coughing intermittently during visit.  CV: no obvious cyanosis  MS: moves all visible extremities without noticeable abnormality  PSYCH/NEURO: pleasant and cooperative, no obvious depression or anxiety, speech and thought processing grossly intact  SKIN: no abnormalities noted.  Assessment/Plan  1. Acute non-recurrent sinusitis, unspecified location She has had sx for over 3 weeks and worsening of sinus congestion, pain. She was given doxycycline initially without improvement (this was 2 weeks ago). I am going to treat with levaquin (given her PCN and sulfa allergy) x 5 days. Advised afrin prior to flight. Continue with mucinex, antihistamine. Let me know if not better on Friday and we can consider short steroid burst prior to plane ride. Prefer not to use if improving.   Follow up prn    I discussed the assessment and treatment plan with the patient. The patient was provided an opportunity to ask questions and all were answered. The patient agreed with the plan and demonstrated an understanding of the instructions.   The patient was advised to call back or seek an in-person evaluation if the symptoms worsen or if the condition fails to improve as anticipated.  I provided 20 minutes of non-face-to-face time during this encounter.   Micheline Rough, MD

## 2021-01-03 ENCOUNTER — Other Ambulatory Visit: Payer: Self-pay | Admitting: Family Medicine

## 2021-01-03 DIAGNOSIS — I10 Essential (primary) hypertension: Secondary | ICD-10-CM

## 2021-01-03 DIAGNOSIS — E785 Hyperlipidemia, unspecified: Secondary | ICD-10-CM

## 2021-02-01 ENCOUNTER — Ambulatory Visit: Payer: Medicare Other

## 2021-02-01 ENCOUNTER — Ambulatory Visit
Admission: RE | Admit: 2021-02-01 | Discharge: 2021-02-01 | Disposition: A | Discharge status: Home / Self Care | Payer: Medicare Other | Source: Ambulatory Visit | Attending: Family Medicine | Admitting: Family Medicine

## 2021-02-01 ENCOUNTER — Other Ambulatory Visit: Payer: Self-pay

## 2021-02-01 DIAGNOSIS — Z1231 Encounter for screening mammogram for malignant neoplasm of breast: Secondary | ICD-10-CM

## 2021-02-17 ENCOUNTER — Other Ambulatory Visit: Payer: Self-pay

## 2021-02-18 ENCOUNTER — Ambulatory Visit (INDEPENDENT_AMBULATORY_CARE_PROVIDER_SITE_OTHER): Payer: Medicare Other | Admitting: Family Medicine

## 2021-02-18 ENCOUNTER — Encounter: Payer: Self-pay | Admitting: Family Medicine

## 2021-02-18 VITALS — BP 112/74 | HR 65 | Temp 97.6°F | Ht 69.0 in | Wt 161.3 lb

## 2021-02-18 DIAGNOSIS — Z8639 Personal history of other endocrine, nutritional and metabolic disease: Secondary | ICD-10-CM | POA: Diagnosis not present

## 2021-02-18 DIAGNOSIS — E785 Hyperlipidemia, unspecified: Secondary | ICD-10-CM | POA: Diagnosis not present

## 2021-02-18 DIAGNOSIS — K635 Polyp of colon: Secondary | ICD-10-CM

## 2021-02-18 DIAGNOSIS — I10 Essential (primary) hypertension: Secondary | ICD-10-CM | POA: Diagnosis not present

## 2021-02-18 DIAGNOSIS — N952 Postmenopausal atrophic vaginitis: Secondary | ICD-10-CM

## 2021-02-18 DIAGNOSIS — R3915 Urgency of urination: Secondary | ICD-10-CM | POA: Diagnosis not present

## 2021-02-18 LAB — CBC WITH DIFFERENTIAL/PLATELET
Basophils Absolute: 0.1 10*3/uL (ref 0.0–0.1)
Basophils Relative: 1 % (ref 0.0–3.0)
Eosinophils Absolute: 0.2 10*3/uL (ref 0.0–0.7)
Eosinophils Relative: 3.1 % (ref 0.0–5.0)
HCT: 36.7 % (ref 36.0–46.0)
Hemoglobin: 12.7 g/dL (ref 12.0–15.0)
Lymphocytes Relative: 36.8 % (ref 12.0–46.0)
Lymphs Abs: 1.9 10*3/uL (ref 0.7–4.0)
MCHC: 34.5 g/dL (ref 30.0–36.0)
MCV: 93 fl (ref 78.0–100.0)
Monocytes Absolute: 0.5 10*3/uL (ref 0.1–1.0)
Monocytes Relative: 9.9 % (ref 3.0–12.0)
Neutro Abs: 2.5 10*3/uL (ref 1.4–7.7)
Neutrophils Relative %: 49.2 % (ref 43.0–77.0)
Platelets: 231 10*3/uL (ref 150.0–400.0)
RBC: 3.95 Mil/uL (ref 3.87–5.11)
RDW: 13.3 % (ref 11.5–15.5)
WBC: 5.2 10*3/uL (ref 4.0–10.5)

## 2021-02-18 LAB — COMPREHENSIVE METABOLIC PANEL
ALT: 17 U/L (ref 0–35)
AST: 18 U/L (ref 0–37)
Albumin: 4.3 g/dL (ref 3.5–5.2)
Alkaline Phosphatase: 97 U/L (ref 39–117)
BUN: 21 mg/dL (ref 6–23)
CO2: 26 mEq/L (ref 19–32)
Calcium: 10.1 mg/dL (ref 8.4–10.5)
Chloride: 102 mEq/L (ref 96–112)
Creatinine, Ser: 1.1 mg/dL (ref 0.40–1.20)
GFR: 50.5 mL/min — ABNORMAL LOW (ref >60.00)
Glucose, Bld: 94 mg/dL (ref 70–99)
Potassium: 4 mEq/L (ref 3.5–5.1)
Sodium: 138 mEq/L (ref 135–145)
Total Bilirubin: 0.9 mg/dL (ref 0.2–1.2)
Total Protein: 6.8 g/dL (ref 6.0–8.3)

## 2021-02-18 LAB — LIPID PANEL
Cholesterol: 162 mg/dL (ref 0–200)
HDL: 49.3 mg/dL (ref >39.00)
LDL Cholesterol: 83 mg/dL (ref 0–99)
NonHDL: 112.64
Total CHOL/HDL Ratio: 3
Triglycerides: 150 mg/dL — ABNORMAL HIGH (ref 0.0–149.0)
VLDL: 30 mg/dL (ref 0.0–40.0)

## 2021-02-18 LAB — T4, FREE: Free T4: 0.91 ng/dL (ref 0.60–1.60)

## 2021-02-18 LAB — TSH: TSH: 2.98 u[IU]/mL (ref 0.35–4.50)

## 2021-02-18 LAB — T3, FREE: T3, Free: 3.3 pg/mL (ref 2.3–4.2)

## 2021-02-18 MED ORDER — OXYBUTYNIN CHLORIDE ER 10 MG PO TB24: 10.0000 mg | ORAL_TABLET | Freq: Every day | ORAL | 1 refills | Status: DC

## 2021-02-18 NOTE — Progress Notes (Signed)
Caitlin Bowers DOB: July 13, 1951 Encounter date: 02/18/2021  This is a 70 y.o. female who presents with Chief Complaint  Patient presents with  . Annual Exam    History of present illness: She did have a virtual visit with me on 1/25 for cough. Has recovered from this.  She is weaning self off of myrbetric. Taking it every other day just due to cost of medication.   On atenolol for heart rate control which is affected secondary to Graves' disease.  Hypertension: Maxide 25 - not checking blood pressure at home.   Hyperlipidemia: Lipitor 40 mg; no problems with this medication.  Estrogen deficiency: On estradiol 0.13 times a week; just puts a little on finger; not full applicator. Does help her significantly. No concerns with this; no opain/dryness.   Urinary urgency: Myrbetriq 50 mg daily but costing $500/month; we are going to try oxybutynin; she didn't try before.   Last mammogram was 02/03/21 and was normal Has had bone density:  07/2017: Frax 10 year major: 8.3% and 10 year hip frx 1%. Osteopenia. Improved from previous.  Colonoscopy last completed 07/2016 and is due for repeat in 5 years. Multiple polyps.  Last pap was 2020 and was normal   Allergies  Allergen Reactions  . Penicillins Hives  . Sulfa Antibiotics Hives   Current Meds  Medication Sig  . atenolol (TENORMIN) 25 MG tablet TAKE 1 TABLET BY MOUTH EVERY DAY  . atorvastatin (LIPITOR) 40 MG tablet TAKE 1 TABLET BY MOUTH EVERYDAY AT BEDTIME  . Calcium Carb-Cholecalciferol (CALCIUM 1000 + D PO) Take 2 tablets by mouth daily.  Marland Kitchen estradiol (ESTRACE) 0.1 MG/GM vaginal cream Place 1 Applicatorful vaginally 3 (three) times a week.  Marland Kitchen GLUCOSAMINE-CHONDROITIN PO Take 2 capsules by mouth daily. 750-100//125-1.65  . Methylcellulose, Laxative, (CITRUCEL) 500 MG TABS Take 2,000 mg by mouth daily.  Marland Kitchen oxybutynin (DITROPAN XL) 10 MG 24 hr tablet Take 1 tablet (10 mg total) by mouth at bedtime.  .  triamterene-hydrochlorothiazide (MAXZIDE-25) 37.5-25 MG tablet TAKE 1 TABLET BY MOUTH EVERY DAY  . [DISCONTINUED] MYRBETRIQ 50 MG TB24 tablet TAKE 1 TABLET BY MOUTH EVERY DAY    Review of Systems  Constitutional: Negative for activity change, appetite change, chills, fatigue, fever and unexpected weight change.  HENT: Negative for congestion, ear pain, hearing loss, sinus pressure, sinus pain, sore throat and trouble swallowing.   Eyes: Negative for pain and visual disturbance.  Respiratory: Negative for cough, chest tightness, shortness of breath and wheezing.   Cardiovascular: Negative for chest pain, palpitations and leg swelling.  Gastrointestinal: Negative for abdominal pain, blood in stool, constipation, diarrhea, nausea and vomiting.  Genitourinary: Negative for difficulty urinating and menstrual problem.       Was getting up every 45 minutes prior to myrbetriq. Had full urology eval and was negative - including scopes, testing.  Musculoskeletal: Positive for arthralgias (right shoulder pain, intermittent). Negative for back pain.  Skin: Negative for rash.  Neurological: Negative for dizziness, weakness, numbness and headaches.  Hematological: Negative for adenopathy. Does not bruise/bleed easily.  Psychiatric/Behavioral: Negative for sleep disturbance and suicidal ideas. The patient is not nervous/anxious.     Objective:  BP 112/74 (BP Location: Left Arm, Patient Position: Sitting, Cuff Size: Normal)   Pulse 65   Temp 97.6 F (36.4 C) (Oral)   Ht 5\' 9"  (1.753 m)   Wt 161 lb 4.8 oz (73.2 kg)   SpO2 96%   BMI 23.82 kg/m   Weight: 161 lb 4.8 oz (73.2  kg)   BP Readings from Last 3 Encounters:  02/18/21 112/74  02/16/20 100/68  11/19/19 122/78   Wt Readings from Last 3 Encounters:  02/18/21 161 lb 4.8 oz (73.2 kg)  02/16/20 161 lb 11.2 oz (73.3 kg)  11/19/19 154 lb 9.6 oz (70.1 kg)    Physical Exam Constitutional:      General: She is not in acute distress.     Appearance: She is well-developed.  HENT:     Head: Normocephalic and atraumatic.     Right Ear: External ear normal.     Left Ear: External ear normal.     Mouth/Throat:     Pharynx: No oropharyngeal exudate.  Eyes:     Conjunctiva/sclera: Conjunctivae normal.     Pupils: Pupils are equal, round, and reactive to light.  Neck:     Thyroid: No thyromegaly.  Cardiovascular:     Rate and Rhythm: Normal rate and regular rhythm.     Heart sounds: Normal heart sounds. No murmur heard. No friction rub. No gallop.   Pulmonary:     Effort: Pulmonary effort is normal.     Breath sounds: Normal breath sounds.  Abdominal:     General: Bowel sounds are normal. There is no distension.     Palpations: Abdomen is soft. There is no mass.     Tenderness: There is no abdominal tenderness. There is no guarding.     Hernia: No hernia is present.  Musculoskeletal:        General: No tenderness or deformity. Normal range of motion.     Cervical back: Normal range of motion and neck supple.     Comments: Some pain with internal rotation; otherwise normal strength and mobility right shoulder. No external tenderness.  Lymphadenopathy:     Cervical: No cervical adenopathy.  Skin:    General: Skin is warm and dry.     Findings: No rash.  Neurological:     Mental Status: She is alert and oriented to person, place, and time.     Deep Tendon Reflexes: Reflexes normal.     Reflex Scores:      Tricep reflexes are 2+ on the right side and 2+ on the left side.      Bicep reflexes are 2+ on the right side and 2+ on the left side.      Brachioradialis reflexes are 2+ on the right side and 2+ on the left side.      Patellar reflexes are 2+ on the right side and 2+ on the left side. Psychiatric:        Speech: Speech normal.        Behavior: Behavior normal.        Thought Content: Thought content normal.     Assessment/Plan  1. Hypertension, unspecified type Well controlled; continue with maxzide 25,  atenolol.  - CBC with Differential/Platelet; Future - Comprehensive metabolic panel; Future  2. Urinary urgency myrbetriq was extremely helpful; but cost is prohibitive. We are going to try oxybutynin and start with 10mg  dose.  - Urinalysis with Culture Reflex  3. Hyperlipidemia, unspecified hyperlipidemia type Continue with lipitor 40mg  daily. - Lipid panel; Future  4. History of Graves' disease - TSH; Future - T4, free; Future - T3, free; Future  5. Polyp of colon, unspecified part of colon, unspecified type - Ambulatory referral to Gastroenterology  6. Vaginal atrophy; postmenopausal Does great with low dose estrace cream. We will complete vaginal exam at next physical since she had  normal pap 2 years ago.   Return in about 6 months (around 08/21/2021) for Chronic condition visit.     Micheline Rough, MD

## 2021-02-18 NOTE — Addendum Note (Signed)
Addended by: Janann Colonel on: 02/18/2021 09:06 AM   Modules accepted: Orders

## 2021-02-20 LAB — URINALYSIS W MICROSCOPIC + REFLEX CULTURE
Bacteria, UA: NONE SEEN /HPF
Bilirubin Urine: NEGATIVE
Glucose, UA: NEGATIVE
Hgb urine dipstick: NEGATIVE
Hyaline Cast: NONE SEEN /LPF
Ketones, ur: NEGATIVE
Nitrites, Initial: NEGATIVE
Protein, ur: NEGATIVE
Specific Gravity, Urine: 1.012 (ref 1.001–1.03)
pH: 8 (ref 5.0–8.0)

## 2021-02-20 LAB — URINE CULTURE
MICRO NUMBER:: 11667934
SPECIMEN QUALITY:: ADEQUATE

## 2021-02-20 LAB — CULTURE INDICATED

## 2021-04-18 ENCOUNTER — Other Ambulatory Visit: Payer: Self-pay | Admitting: Family Medicine

## 2021-04-18 DIAGNOSIS — I10 Essential (primary) hypertension: Secondary | ICD-10-CM

## 2021-04-18 DIAGNOSIS — E785 Hyperlipidemia, unspecified: Secondary | ICD-10-CM

## 2021-05-09 ENCOUNTER — Telehealth: Payer: Self-pay | Admitting: Gastroenterology

## 2021-05-09 NOTE — Telephone Encounter (Signed)
Hi Dr. Bryan Lemma, we have received a referral from patient's PCP for a repeat colon. Patient had colon in 2017. Records are in Epic, Care everywhere under [facility]. Please advise on scheduling. Thank you.

## 2021-05-11 NOTE — Telephone Encounter (Signed)
Chart reviewed and notable for the following:  - Colonoscopy approximately 2012 notable for polyps - Colonoscopy (07/2016 at [facility]): Sigmoid diverticulosis, 2 polyps removed from ascending colon (path: Tubular adenoma x1, benign x1), 10 mm polyp at 25 cm (path: benign mucosal neurofibroma).  Recommend repeat 5 years  Based on the last colonoscopy, plan for repeat colonoscopy now for ongoing surveillance.  Okay to schedule direct with me.

## 2021-05-12 NOTE — Telephone Encounter (Signed)
Spoke w. Pt. She needs colon in October. We will contact pt when October calendar is available.

## 2021-05-22 ENCOUNTER — Emergency Department (HOSPITAL_BASED_OUTPATIENT_CLINIC_OR_DEPARTMENT_OTHER)
Admission: EM | Admit: 2021-05-22 | Discharge: 2021-05-22 | Disposition: A | Discharge status: Home / Self Care | Payer: Medicare Other | Attending: Emergency Medicine | Admitting: Emergency Medicine

## 2021-05-22 ENCOUNTER — Other Ambulatory Visit: Payer: Self-pay

## 2021-05-22 ENCOUNTER — Encounter (HOSPITAL_BASED_OUTPATIENT_CLINIC_OR_DEPARTMENT_OTHER): Payer: Self-pay

## 2021-05-22 DIAGNOSIS — R040 Epistaxis: Secondary | ICD-10-CM | POA: Insufficient documentation

## 2021-05-22 DIAGNOSIS — Z79899 Other long term (current) drug therapy: Secondary | ICD-10-CM | POA: Diagnosis not present

## 2021-05-22 DIAGNOSIS — T4995XA Adverse effect of unspecified topical agent, initial encounter: Secondary | ICD-10-CM | POA: Diagnosis not present

## 2021-05-22 DIAGNOSIS — T50905A Adverse effect of unspecified drugs, medicaments and biological substances, initial encounter: Secondary | ICD-10-CM

## 2021-05-22 DIAGNOSIS — Z85828 Personal history of other malignant neoplasm of skin: Secondary | ICD-10-CM | POA: Diagnosis not present

## 2021-05-22 DIAGNOSIS — I10 Essential (primary) hypertension: Secondary | ICD-10-CM | POA: Diagnosis not present

## 2021-05-22 NOTE — ED Triage Notes (Signed)
Pt states has had nose bleed that started 30 mins ago - states had same episode  yesterday but she was able to  stop it - bleeding controlled at this time  PMX - skin cancer - on meds - imiquimod cream

## 2021-05-22 NOTE — Discharge Instructions (Addendum)
Stop the imiquimod given concern for local reaction and complications and discuss with your dermatologist.

## 2021-05-24 NOTE — ED Provider Notes (Signed)
[facility] DEPT Provider Note   CSN: [id number] Arrival date & time: 05/22/21  1552     History Chief Complaint  Patient presents with   Epistaxis    Caitlin Bowers is a 70 y.o. female.  HPI        70 year old female with history of hypertension, hyperlipidemia, recent imiquimod use for basal cell carcinoma on the nose who presents with concern for epistaxis.  She reports that she had an episode yesterday which resolved, however it returned today.  Reports significant bleeding at the time that it happens.  Reports that she does put pressure over the area.  Denies being on anticoagulation.  Reports she is concerned that the treatment for her basal cell on her nose is resulted in the nosebleeds.  Reports some soreness right at the area of treatment, but denies any other significant pain to the nose.  The area is red and has been constant.  Denies any chills or fever.  No nausea or vomiting. Past Medical History:  Diagnosis Date   Graves disease    Hyperlipidemia    Hypertension    Urgency of urination     Patient Active Problem List   Diagnosis Date Noted   Urinary urgency 11/05/2019   Hyperlipidemia 11/05/2019   Hypertension 11/05/2019   History of Graves' disease 11/05/2019    Past Surgical History:  Procedure Laterality Date   CATARACT EXTRACTION, BILATERAL Bilateral    CESAREAN SECTION     1979   TONSILLECTOMY  1957     OB History   No obstetric history on file.     Family History  Adopted: Yes  Family history unknown: Yes    Social History   Tobacco Use   Smoking status: Never   Smokeless tobacco: Never  Vaping Use   Vaping Use: Never used  Substance Use Topics   Alcohol use: Yes    Alcohol/week: 3.0 standard drinks    Types: 3 Shots of liquor per week    Comment: per week   Drug use: Never    Home Medications Prior to Admission medications   Medication Sig Start Date End Date Taking? Authorizing Provider   atenolol (TENORMIN) 25 MG tablet TAKE 1 TABLET BY MOUTH EVERY DAY 04/18/21  Yes Koberlein, Junell C, MD  atorvastatin (LIPITOR) 40 MG tablet TAKE 1 TABLET BY MOUTH EVERYDAY AT BEDTIME 04/18/21  Yes Koberlein, Junell C, MD  Calcium Carb-Cholecalciferol (CALCIUM 1000 + D PO) Take 2 tablets by mouth daily.   Yes [provider]  estradiol (ESTRACE) 0.1 MG/GM vaginal cream Place 1 Applicatorful vaginally 3 (three) times a week. 10/22/20  Yes Koberlein, Junell C, MD  GLUCOSAMINE-CHONDROITIN PO Take 2 capsules by mouth daily. 750-100//125-1.65   Yes [provider]  imiquimod (ALDARA) 5 % cream Apply topically 3 (three) times a week.   Yes [provider]  Methylcellulose, Laxative, (CITRUCEL) 500 MG TABS Take 2,000 mg by mouth daily.   Yes [provider]  Mirabegron (MYRBETRIQ PO) Take by mouth.   Yes [provider]  triamterene-hydrochlorothiazide (MAXZIDE-25) 37.5-25 MG tablet TAKE 1 TABLET BY MOUTH EVERY DAY 04/18/21  Yes Koberlein, Junell C, MD  oxybutynin (DITROPAN XL) 10 MG 24 hr tablet Take 1 tablet (10 mg total) by mouth at bedtime. 02/18/21   Caren Macadam, MD    Allergies    Penicillins and Sulfa antibiotics  Review of Systems   Review of Systems  Constitutional:  Negative for fever.  HENT:  Positive for nosebleeds.   Respiratory:  Negative for cough.   Gastrointestinal:  Negative for vomiting.  Skin:  Positive for rash and wound.  Neurological:  Negative for headaches.   Physical Exam Updated Vital Signs BP (!) 187/100 (BP Location: Right Arm)   Pulse 74   Temp 98.5 F (36.9 C) (Oral)   Resp 20   Ht 5\' 10"  (1.778 m)   Wt 63.5 kg   SpO2 99%   BMI 20.09 kg/m   Physical Exam Vitals and nursing note reviewed.  Constitutional:      General: She is not in acute distress.    Appearance: Normal appearance. She is not ill-appearing, toxic-appearing or diaphoretic.  HENT:     Head: Normocephalic.     Nose:     Comments: Left  side of nose with area darked central area and surrounding erythema, overall nontender Evidence of nosebleed propr without active bleeding , turbinates edematous Eyes:     Conjunctiva/sclera: Conjunctivae normal.  Cardiovascular:     Rate and Rhythm: Normal rate and regular rhythm.     Pulses: Normal pulses.  Pulmonary:     Effort: Pulmonary effort is normal. No respiratory distress.  Musculoskeletal:        General: No deformity or signs of injury.     Cervical back: No rigidity.  Skin:    General: Skin is warm and dry.     Coloration: Skin is not jaundiced or pale.  Neurological:     General: No focal deficit present.     Mental Status: She is alert and oriented to person, place, and time.    ED Results / Procedures / Treatments   Labs (all labs ordered are listed, but only abnormal results are displayed) Labs Reviewed - No data to display  EKG None  Radiology No results found.  Procedures Procedures   Medications Ordered in ED Medications - No data to display  ED Course  I have reviewed the triage vital signs and the nursing notes.  Pertinent labs & imaging results that were available during my care of the patient were reviewed by me and considered in my medical decision making (see chart for details).    MDM Rules/Calculators/A&P                           70 year old female with history of hypertension, hyperlipidemia, recent imiquimod use for basal cell carcinoma on the nose who presents with concern for epistaxis. Bleeding controlled prior to arrival.  Discussed would not recommend further packing given no active bleeding and discussed nosebleed care, using constant pressure, gave nose clip, recommend afrin.  Recommend ENT follow up given nosebleed and discussed stopping imiquimod given local reaction and nose bleeds and discussing with Dermatology. Not having pain or fever and doubt cellulitis at this time. No other easy bleeding or bruising. Patient discharged  in stable condition with understanding of reasons to return.    Final Clinical Impression(s) / ED Diagnoses Final diagnoses:  Left-sided epistaxis  Adverse effect of drug, initial encounter    Rx / DC Orders ED Discharge Orders     None        Gareth Morgan, MD 05/24/21 1256

## 2021-06-10 ENCOUNTER — Encounter: Payer: Self-pay | Admitting: Family Medicine

## 2021-06-13 MED ORDER — MIRABEGRON ER 50 MG PO TB24: 50.0000 mg | ORAL_TABLET | Freq: Every day | ORAL | 1 refills | Status: DC

## 2021-06-17 ENCOUNTER — Encounter: Payer: Self-pay | Admitting: Family Medicine

## 2021-07-06 NOTE — Telephone Encounter (Signed)
Called patient and lvm to call back to schedule procedure.

## 2021-08-15 ENCOUNTER — Encounter: Payer: Self-pay | Admitting: Family Medicine

## 2021-08-22 ENCOUNTER — Ambulatory Visit (INDEPENDENT_AMBULATORY_CARE_PROVIDER_SITE_OTHER): Payer: Medicare Other | Admitting: Family Medicine

## 2021-08-22 ENCOUNTER — Encounter: Payer: Self-pay | Admitting: Family Medicine

## 2021-08-22 ENCOUNTER — Ambulatory Visit: Payer: Medicare Other | Admitting: Family Medicine

## 2021-08-22 ENCOUNTER — Other Ambulatory Visit: Payer: Self-pay

## 2021-08-22 VITALS — BP 130/80 | HR 71 | Temp 98.1°F | Ht 70.0 in | Wt 162.9 lb

## 2021-08-22 DIAGNOSIS — R3915 Urgency of urination: Secondary | ICD-10-CM | POA: Diagnosis not present

## 2021-08-22 DIAGNOSIS — I10 Essential (primary) hypertension: Secondary | ICD-10-CM

## 2021-08-22 DIAGNOSIS — E785 Hyperlipidemia, unspecified: Secondary | ICD-10-CM | POA: Diagnosis not present

## 2021-08-22 DIAGNOSIS — Z8639 Personal history of other endocrine, nutritional and metabolic disease: Secondary | ICD-10-CM

## 2021-08-22 DIAGNOSIS — E2839 Other primary ovarian failure: Secondary | ICD-10-CM

## 2021-08-22 DIAGNOSIS — M25511 Pain in right shoulder: Secondary | ICD-10-CM

## 2021-08-22 LAB — COMPREHENSIVE METABOLIC PANEL
ALT: 19 U/L (ref 0–35)
AST: 21 U/L (ref 0–37)
Albumin: 4.4 g/dL (ref 3.5–5.2)
Alkaline Phosphatase: 92 U/L (ref 39–117)
BUN: 16 mg/dL (ref 6–23)
CO2: 27 mEq/L (ref 19–32)
Calcium: 9.7 mg/dL (ref 8.4–10.5)
Chloride: 99 mEq/L (ref 96–112)
Creatinine, Ser: 1.04 mg/dL (ref 0.40–1.20)
GFR: 53.82 mL/min — ABNORMAL LOW (ref >60.00)
Glucose, Bld: 86 mg/dL (ref 70–99)
Potassium: 4 mEq/L (ref 3.5–5.1)
Sodium: 135 mEq/L (ref 135–145)
Total Bilirubin: 0.7 mg/dL (ref 0.2–1.2)
Total Protein: 7.5 g/dL (ref 6.0–8.3)

## 2021-08-22 LAB — LIPID PANEL
Cholesterol: 181 mg/dL (ref 0–200)
HDL: 61.6 mg/dL (ref >39.00)
LDL Cholesterol: 91 mg/dL (ref 0–99)
NonHDL: 119.19
Total CHOL/HDL Ratio: 3
Triglycerides: 143 mg/dL (ref 0.0–149.0)
VLDL: 28.6 mg/dL (ref 0.0–40.0)

## 2021-08-22 LAB — CBC WITH DIFFERENTIAL/PLATELET
Basophils Absolute: 0.1 10*3/uL (ref 0.0–0.1)
Basophils Relative: 0.8 % (ref 0.0–3.0)
Eosinophils Absolute: 0.2 10*3/uL (ref 0.0–0.7)
Eosinophils Relative: 3.2 % (ref 0.0–5.0)
HCT: 36.8 % (ref 36.0–46.0)
Hemoglobin: 12.3 g/dL (ref 12.0–15.0)
Lymphocytes Relative: 27.2 % (ref 12.0–46.0)
Lymphs Abs: 2 10*3/uL (ref 0.7–4.0)
MCHC: 33.3 g/dL (ref 30.0–36.0)
MCV: 94.4 fl (ref 78.0–100.0)
Monocytes Absolute: 0.6 10*3/uL (ref 0.1–1.0)
Monocytes Relative: 8.1 % (ref 3.0–12.0)
Neutro Abs: 4.4 10*3/uL (ref 1.4–7.7)
Neutrophils Relative %: 60.7 % (ref 43.0–77.0)
Platelets: 228 10*3/uL (ref 150.0–400.0)
RBC: 3.89 Mil/uL (ref 3.87–5.11)
RDW: 13.5 % (ref 11.5–15.5)
WBC: 7.2 10*3/uL (ref 4.0–10.5)

## 2021-08-22 MED ORDER — ESTRADIOL 0.1 MG/GM VA CREA: 1.0000 | TOPICAL_CREAM | VAGINAL | 5 refills | Status: DC

## 2021-08-22 NOTE — Progress Notes (Signed)
Caitlin Bowers DOB: 10-29-1951 Encounter date: 08/22/2021  This is a 70 y.o. female who presents with Chief Complaint  Patient presents with   Follow-up     History of present illness: Patient was in the emergency room on 6/19 for nosebleed. Started after med from dermatology; stopped once she stopped using med.   Did have covid over summer.   Last visit with me was 02/18/2021 for a physical. On atenolol for heart rate control which is affected secondary to Graves' disease.   Hypertension: Maxide 25 - not checking blood pressure at home. has been ok at other doc offices (outside of ER visit)   Hyperlipidemia: Lipitor 40 mg; no problems with this medication. tolerates this well.    Estrogen deficiency: On estradiol 0.13 times a week; just puts a little on finger; not full applicator. Does help her significantly. No concerns with this; no opain/dryness.    Urinary urgency: Myrbetriq was very expensive for her, but she did not get symptom relief with oxybutynin we tried it. Cost her about $300/3 months after she pays deductible.    Last mammogram was 02/03/21 and was normal Has had bone density:  07/2017: Frax 10 year major: 8.3% and 10 year hip frx 1%. Osteopenia. Improved from previous.  Colonoscopy last completed 07/2016 and is due for repeat -appointment set up for October. Last pap was 2020 and was normal  Allergies  Allergen Reactions   Oxybutynin     Dry mouth; no taste; not effective for incontinence   Penicillins Hives   Sulfa Antibiotics Hives   Current Meds  Medication Sig   atenolol (TENORMIN) 25 MG tablet TAKE 1 TABLET BY MOUTH EVERY DAY   atorvastatin (LIPITOR) 40 MG tablet TAKE 1 TABLET BY MOUTH EVERYDAY AT BEDTIME   Calcium Carb-Cholecalciferol (CALCIUM 1000 + D PO) Take 2 tablets by mouth daily.   Chlorpheniramine Maleate (ALLERGY PO) Take by mouth at bedtime.   GLUCOSAMINE-CHONDROITIN PO Take 2 capsules by mouth daily. 750-100//125-1.65    Methylcellulose, Laxative, (CITRUCEL) 500 MG TABS Take 2,000 mg by mouth daily.   mirabegron ER (MYRBETRIQ) 50 MG TB24 tablet Take 1 tablet (50 mg total) by mouth daily.   Probiotic Product (PROBIOTIC PO) Take by mouth daily.   triamterene-hydrochlorothiazide (MAXZIDE-25) 37.5-25 MG tablet TAKE 1 TABLET BY MOUTH EVERY DAY   [DISCONTINUED] estradiol (ESTRACE) 0.1 MG/GM vaginal cream Place 1 Applicatorful vaginally 3 (three) times a week.    Review of Systems  Constitutional:  Negative for chills, fatigue and fever.  Respiratory:  Negative for cough, chest tightness, shortness of breath and wheezing.   Cardiovascular:  Negative for chest pain, palpitations and leg swelling.   Objective:  BP 130/80 (BP Location: Left Arm, Patient Position: Sitting, Cuff Size: Normal)   Pulse 71   Temp 98.1 F (36.7 C) (Oral)   Ht 5\' 10"  (1.778 m)   Wt 162 lb 14.4 oz (73.9 kg)   SpO2 99%   BMI 23.37 kg/m   Weight: 162 lb 14.4 oz (73.9 kg)   BP Readings from Last 3 Encounters:  08/22/21 130/80  05/22/21 (!) 187/100  02/18/21 112/74   Wt Readings from Last 3 Encounters:  08/22/21 162 lb 14.4 oz (73.9 kg)  05/22/21 140 lb (63.5 kg)  02/18/21 161 lb 4.8 oz (73.2 kg)    Physical Exam Constitutional:      General: She is not in acute distress.    Appearance: She is well-developed.  Cardiovascular:     Rate and Rhythm:  Normal rate and regular rhythm.     Heart sounds: Normal heart sounds. No murmur heard.   No friction rub.  Pulmonary:     Effort: Pulmonary effort is normal. No respiratory distress.     Breath sounds: Normal breath sounds. No wheezing or rales.  Musculoskeletal:     Right lower leg: No edema.     Left lower leg: No edema.  Neurological:     Mental Status: She is alert and oriented to person, place, and time.  Psychiatric:        Behavior: Behavior normal.    Assessment/Plan  1. Hypertension, unspecified type Blood pressure has been well controlled with current  medication - continue atenolol 25 mg daily, Maxide-25. - CBC with Differential/Platelet; Future - Comprehensive metabolic panel; Future - Comprehensive metabolic panel - CBC with Differential/Platelet  2. Hyperlipidemia, unspecified hyperlipidemia type Has been well controlled.  Continue Lipitor 40 mg daily. - Lipid panel; Future - Lipid panel  3. History of Graves' disease In remission; heart rate has been controlled.  4. Urinary urgency Myrbetriq works best for her. Worth cost for her at this point.  5. Right shoulder pain, unspecified chronicity Bothering her intermittently.  Exam is stable today.  If worsening of discomfort, she will let me know.  Would suggest starting with x-ray and considering further evaluation pending that.  6. Estrogen deficiency Which is very small amount of Estrace cream a few times a week.  This helped significantly with vaginal dryness. - estradiol (ESTRACE) 0.1 MG/GM vaginal cream; Place 1 Applicatorful vaginally 3 (three) times a week.  Dispense: 42.5 g; Refill: 5   Return in about 6 months (around 02/19/2022) for physical exam.      Micheline Rough, MD

## 2021-08-24 ENCOUNTER — Other Ambulatory Visit: Payer: Self-pay

## 2021-08-24 ENCOUNTER — Ambulatory Visit (AMBULATORY_SURGERY_CENTER): Payer: Medicare Other

## 2021-08-24 VITALS — Ht 69.0 in | Wt 163.0 lb

## 2021-08-24 DIAGNOSIS — Z86010 Personal history of colonic polyps: Secondary | ICD-10-CM

## 2021-08-24 NOTE — Progress Notes (Signed)
No egg or soy allergy known to patient  No issues known to pt with past sedation with any surgeries or procedures Patient denies ever being told they had issues or difficulty with intubation  No FH of Malignant Hyperthermia Pt is not on diet pills Pt is not on  home 02  Pt is not on blood thinners  Pt denies issues with constipation  No A fib or A flutter  EMMI video to pt or via [place] 19 guidelines implemented in PV today with Pt and RN  Pt is fully vaccinated for Covid x 2; NO PA's for preps discussed with pt in PV today  Discussed with pt there will be an out-of-pocket cost for prep and that varies from $0 to 70 +  dollars  Due to the COVID-19 pandemic we are asking patients to follow certain guidelines.  Pt aware of COVID protocols and [facility] guidelines

## 2021-09-07 ENCOUNTER — Encounter: Payer: Self-pay | Admitting: Gastroenterology

## 2021-09-07 ENCOUNTER — Ambulatory Visit (AMBULATORY_SURGERY_CENTER): Payer: Medicare Other | Admitting: Gastroenterology

## 2021-09-07 ENCOUNTER — Other Ambulatory Visit: Payer: Self-pay

## 2021-09-07 VITALS — BP 154/79 | HR 67 | Temp 98.0°F | Resp 13 | Ht 69.0 in | Wt 163.0 lb

## 2021-09-07 DIAGNOSIS — K641 Second degree hemorrhoids: Secondary | ICD-10-CM

## 2021-09-07 DIAGNOSIS — D124 Benign neoplasm of descending colon: Secondary | ICD-10-CM

## 2021-09-07 DIAGNOSIS — Z86010 Personal history of colonic polyps: Secondary | ICD-10-CM | POA: Diagnosis present

## 2021-09-07 DIAGNOSIS — D120 Benign neoplasm of cecum: Secondary | ICD-10-CM | POA: Diagnosis not present

## 2021-09-07 DIAGNOSIS — K5730 Diverticulosis of large intestine without perforation or abscess without bleeding: Secondary | ICD-10-CM

## 2021-09-07 DIAGNOSIS — D123 Benign neoplasm of transverse colon: Secondary | ICD-10-CM | POA: Diagnosis not present

## 2021-09-07 MED ORDER — SODIUM CHLORIDE 0.9 % IV SOLN: 500.0000 mL | Freq: Once | INTRAVENOUS | Status: DC

## 2021-09-07 NOTE — Patient Instructions (Signed)
Please read handouts provided. Continue present medications. Await pathology results. Consider a fiber supplement.   YOU HAD AN ENDOSCOPIC PROCEDURE TODAY AT [facility]:   Refer to the procedure report that was given to you for any specific questions about what was found during the examination.  If the procedure report does not answer your questions, please call your gastroenterologist to clarify.  If you requested that your care partner not be given the details of your procedure findings, then the procedure report has been included in a sealed envelope for you to review at your convenience later.  YOU SHOULD EXPECT: Some feelings of bloating in the abdomen. Passage of more gas than usual.  Walking can help get rid of the air that was put into your GI tract during the procedure and reduce the bloating. If you had a lower endoscopy (such as a colonoscopy or flexible sigmoidoscopy) you may notice spotting of blood in your stool or on the toilet paper. If you underwent a bowel prep for your procedure, you may not have a normal bowel movement for a few days.  Please Note:  You might notice some irritation and congestion in your nose or some drainage.  This is from the oxygen used during your procedure.  There is no need for concern and it should clear up in a day or so.  SYMPTOMS TO REPORT IMMEDIATELY:  Following lower endoscopy (colonoscopy or flexible sigmoidoscopy):  Excessive amounts of blood in the stool  Significant tenderness or worsening of abdominal pains  Swelling of the abdomen that is new, acute  Fever of 100F or higher   For urgent or emergent issues, a gastroenterologist can be reached at any hour by calling [phone]. Do not use MyChart messaging for urgent concerns.    DIET:  We do recommend a small meal at first, but then you may proceed to your regular diet.  Drink plenty of fluids but you should avoid alcoholic beverages for 24 hours.  ACTIVITY:  You  should plan to take it easy for the rest of today and you should NOT DRIVE or use heavy machinery until tomorrow (because of the sedation medicines used during the test).    FOLLOW UP: Our staff will call the number listed on your records 48-72 hours following your procedure to check on you and address any questions or concerns that you may have regarding the information given to you following your procedure. If we do not reach you, we will leave a message.  We will attempt to reach you two times.  During this call, we will ask if you have developed any symptoms of COVID 19. If you develop any symptoms (ie: fever, flu-like symptoms, shortness of breath, cough etc.) before then, please call [phone].  If you test positive for Covid 19 in the 2 weeks post procedure, please call and report this information to [place].    If any biopsies were taken you will be contacted by phone or by letter within the next 1-3 weeks.  Please call us at [phone] if you have not heard about the biopsies in 3 weeks.    SIGNATURES/CONFIDENTIALITY: You and/or your care partner have signed paperwork which will be entered into your electronic medical record.  These signatures attest to the fact that that the information above on your After Visit Summary has been reviewed and is understood.  Full responsibility of the confidentiality of this discharge information lies with you and/or your care-partner.

## 2021-09-07 NOTE — Progress Notes (Signed)
VS completed by .   Pt's states no medical or surgical changes since previsit or office visit.  

## 2021-09-07 NOTE — Progress Notes (Signed)
Called to room to assist during endoscopic procedure.  Patient ID and intended procedure confirmed with present staff. Received instructions for my participation in the procedure from the performing physician.  

## 2021-09-07 NOTE — Op Note (Signed)
[place] Patient Name: Caitlin Bowers Procedure Date: 09/07/2021 10:44 AM MRN: [id number] Endoscopist: Gerrit Heck , MD Age: 70 Referring MD:  Date of Birth: Sep 24, 1951 Gender: Female Account #: [id number] Procedure:                Colonoscopy Indications:              Surveillance: Personal history of adenomatous                            polyps on last colonoscopy 5 years ago                           ?" Colonoscopy approximately 2012 notable for polyps                           ?" Colonoscopy (07/2016 at [facility]): Sigmoid                            diverticulosis, 2 polyps removed from ascending                            colon (path: Tubular adenoma x1, benign x1), 10 mm                            polyp at 25 cm (path: benign mucosal neurofibroma).                            Recommend repeat 5 years Medicines:                Monitored Anesthesia Care Procedure:                Pre-Anesthesia Assessment:                           - Prior to the procedure, a History and Physical                            was performed, and patient medications and                            allergies were reviewed. The patient's tolerance of                            previous anesthesia was also reviewed. The risks                            and benefits of the procedure and the sedation                            options and risks were discussed with the patient.                            All questions were answered, and informed consent  was obtained. Prior Anticoagulants: The patient has                            taken no previous anticoagulant or antiplatelet                            agents. ASA Grade Assessment: II - A patient with                            mild systemic disease. After reviewing the risks                            and benefits, the patient was deemed in                            satisfactory condition to undergo the procedure.                            After obtaining informed consent, the colonoscope                            was passed under direct vision. Throughout the                            procedure, the patient's blood pressure, pulse, and                            oxygen saturations were monitored continuously. The                            Olympus CF-HQ190L [id number]) Colonoscope was                            introduced through the anus and advanced to the the                            terminal ileum. The colonoscopy was performed                            without difficulty. The patient tolerated the                            procedure well. The quality of the bowel                            preparation was good. The terminal ileum, ileocecal                            valve, appendiceal orifice, and rectum were                            photographed. Scope In: 11:00:46 AM Scope Out: 11:21:35 AM Scope Withdrawal Time: 0 hours 10 minutes 59 seconds  Total Procedure Duration: 0 hours 20 minutes 49  seconds  Findings:                 Hemorrhoids were found on perianal exam.                           Three sessile polyps were found in the descending                            colon, transverse colon, and cecum. The polyps were                            3 to 5 mm in size. These polyps were removed with a                            cold snare. Resection and retrieval were complete.                            Estimated blood loss was minimal.                           Multiple small and large-mouthed diverticula were                            found in the sigmoid colon.                           The sigmoid colon was moderately tortuous.                            Advancing the scope required applying abdominal                            pressure.                           Non-bleeding internal hemorrhoids were found during                            retroflexion. The hemorrhoids were small and  Grade                            II (internal hemorrhoids that prolapse but reduce                            spontaneously).                           The terminal ileum appeared normal. Complications:            No immediate complications. Estimated Blood Loss:     Estimated blood loss was minimal. Impression:               - Hemorrhoids found on perianal exam.                           - Three 3 to 5 mm polyps in the descending  colon,                            in the transverse colon and in the cecum, removed                            with a cold snare. Resected and retrieved.                           - Diverticulosis in the sigmoid colon.                           - Tortuous colon.                           - Non-bleeding internal hemorrhoids.                           - The examined portion of the ileum was normal. Recommendation:           - Patient has a contact number available for                            emergencies. The signs and symptoms of potential                            delayed complications were discussed with the                            patient. Return to normal activities tomorrow.                            Written discharge instructions were provided to the                            patient.                           - Resume previous diet.                           - Continue present medications.                           - Await pathology results.                           - Repeat colonoscopy for surveillance based on                            pathology results.                           - Use fiber, for example Citrucel, Fibercon, Konsyl                            or Metamucil.                           -  Internal hemorrhoids were noted on this study and                            may be amenable to hemorrhoid band ligation. If you                            are interested in further treatment of these                            hemorrhoids with  band ligation, please contact my                            clinic to set up an appointment for evaluation and                            treatment.                           - Return to GI clinic PRN. Gerrit Heck, MD 09/07/2021 11:25:41 AM

## 2021-09-07 NOTE — Progress Notes (Signed)
GASTROENTEROLOGY PROCEDURE H&P NOTE   Primary Care Physician: Caren Macadam, MD    Reason for Procedure:  Colon Cancer screening/Colon Polyp surveillance  Plan:    Colonoscopy  Patient is appropriate for endoscopic procedure(s) in the ambulatory ([place]) setting.  The nature of the procedure, as well as the risks, benefits, and alternatives were carefully and thoroughly reviewed with the patient. Ample time for discussion and questions allowed. The patient understood, was satisfied, and agreed to proceed.     HPI: Caitlin Bowers is a 70 y.o. female who presents for colonoscopy for ongoing Colon polyp surveillance and Colon Cancer screening.  No active GI symptoms.  No known family history of colon cancer or related malignancy.  Patient is otherwise without complaints or active issues today.  Endoscopic History: - Colonoscopy approximately 2012 notable for polyps - Colonoscopy (07/2016 at [facility]): Sigmoid diverticulosis, 2 polyps removed from ascending colon (path: Tubular adenoma x1, benign x1), 10 mm polyp at 25 cm (path: benign mucosal neurofibroma).  Recommend repeat 5 years  Past Medical History:  Diagnosis Date   Arthritis    RIGHT shoulder, bilateral hands   Cataract    bilateral sx   Graves disease    in remission as of 08/24/2021   Hyperlipidemia    on meds   Hypertension    on meds   Seasonal allergies    Urgency of urination     Past Surgical History:  Procedure Laterality Date   CATARACT EXTRACTION, BILATERAL Bilateral 02/2020   CESAREAN SECTION  1979   COLONOSCOPY  2017   UTSW, tics, 2 polyps, TA x 1, 5 yr recall   POLYPECTOMY  2017   TA x 1   TONSILLECTOMY  1957    Prior to Admission medications   Medication Sig Start Date End Date Taking? Authorizing Provider  atenolol (TENORMIN) 25 MG tablet TAKE 1 TABLET BY MOUTH EVERY DAY Patient taking differently: 12.5 mg. 04/18/21  Yes Koberlein, Junell C, MD  atorvastatin (LIPITOR) 40 MG tablet  TAKE 1 TABLET BY MOUTH EVERYDAY AT BEDTIME Patient taking differently: Take 40 mg by mouth daily. 04/18/21  Yes Koberlein, Steele Berg, MD  Calcium Carb-Cholecalciferol (CALCIUM 1000 + D PO) Take 2 tablets by mouth daily.   Yes [provider]  estradiol (ESTRACE) 0.1 MG/GM vaginal cream Place 1 Applicatorful vaginally 3 (three) times a week. 08/22/21  Yes Koberlein, Junell C, MD  fluticasone (FLONASE) 50 MCG/ACT nasal spray Place into the nose. 11/23/11  Yes [provider]  GLUCOSAMINE-CHONDROITIN PO Take 2 capsules by mouth daily. 750-100//125-1.65   Yes [provider]  Methylcellulose, Laxative, (CITRUCEL) 500 MG TABS Take 2,000 mg by mouth daily.   Yes [provider]  mirabegron ER (MYRBETRIQ) 50 MG TB24 tablet Take 1 tablet (50 mg total) by mouth daily. 06/13/21  Yes Koberlein, Steele Berg, MD  Probiotic Product (PROBIOTIC PO) Take 1 tablet by mouth daily.   Yes [provider]  triamterene-hydrochlorothiazide (MAXZIDE-25) 37.5-25 MG tablet TAKE 1 TABLET BY MOUTH EVERY DAY 04/18/21  Yes Koberlein, Junell C, MD  [facility] SODIUM CHLORIDE 5 % ophthalmic solution daily as needed. 05/19/21   [provider]    Current Outpatient Medications  Medication Sig Dispense Refill   atenolol (TENORMIN) 25 MG tablet TAKE 1 TABLET BY MOUTH EVERY DAY (Patient taking differently: 12.5 mg.) 90 tablet 1   atorvastatin (LIPITOR) 40 MG tablet TAKE 1 TABLET BY MOUTH EVERYDAY AT BEDTIME (Patient taking differently: Take 40 mg by mouth  daily.) 90 tablet 1   Calcium Carb-Cholecalciferol (CALCIUM 1000 + D PO) Take 2 tablets by mouth daily.     estradiol (ESTRACE) 0.1 MG/GM vaginal cream Place 1 Applicatorful vaginally 3 (three) times a week. 42.5 g 5   fluticasone (FLONASE) 50 MCG/ACT nasal spray Place into the nose.     GLUCOSAMINE-CHONDROITIN PO Take 2 capsules by mouth daily. 750-100//125-1.65     Methylcellulose, Laxative, (CITRUCEL) 500 MG TABS Take 2,000 mg by mouth  daily.     mirabegron ER (MYRBETRIQ) 50 MG TB24 tablet Take 1 tablet (50 mg total) by mouth daily. 90 tablet 1   Probiotic Product (PROBIOTIC PO) Take 1 tablet by mouth daily.     triamterene-hydrochlorothiazide (MAXZIDE-25) 37.5-25 MG tablet TAKE 1 TABLET BY MOUTH EVERY DAY 90 tablet 1   [facility] SODIUM CHLORIDE 5 % ophthalmic solution daily as needed.     Current Facility-Administered Medications  Medication Dose Route Frequency Provider Last Rate Last Admin   0.9 %  sodium chloride infusion  500 mL Intravenous Once Cameryn Schum V, DO        Allergies as of 09/07/2021 - Review Complete 09/07/2021  Allergen Reaction Noted   Oxybutynin  06/17/2021   Penicillins Hives 11/05/2019   Sulfa antibiotics Hives 11/05/2019    Family History  Adopted: Yes  Family history unknown: Yes    Social History   Socioeconomic History   Marital status: Married    Spouse name: Not on file   Number of children: Not on file   Years of education: Not on file   Highest education level: Not on file  Occupational History   Not on file  Tobacco Use   Smoking status: Never   Smokeless tobacco: Never  Vaping Use   Vaping Use: Never used  Substance and Sexual Activity   Alcohol use: Yes    Alcohol/week: 7.0 standard drinks    Types: 7 Shots of liquor per week    Comment: per week   Drug use: Never   Sexual activity: Yes  Other Topics Concern   Not on file  Social History Narrative   Not on file   Social Determinants of Health   Financial Resource Strain: Low Risk    Difficulty of Paying Living Expenses: Not hard at all  Food Insecurity: No Food Insecurity   Worried About Charity fundraiser in the Last Year: Never true   [place] in the Last Year: Never true  Transportation Needs: No Transportation Needs   Lack of Transportation (Medical): No   Lack of Transportation (Non-Medical): No  Physical Activity: Sufficiently Active   Days of Exercise per Week: 5 days   Minutes of  Exercise per Session: 40 min  Stress: No Stress Concern Present   Feeling of Stress : Not at all  Social Connections: Moderately Isolated   Frequency of Communication with Friends and Family: More than three times a week   Frequency of Social Gatherings with Friends and Family: Once a week   Attends Religious Services: Never   Marine scientist or Organizations: No   Attends Music therapist: Never   Marital Status: Married  Human resources officer Violence: Not At Risk   Fear of Current or Ex-Partner: No   Emotionally Abused: No   Physically Abused: No   Sexually Abused: No    Physical Exam: Vital signs in last 24 hours: @BP  (!) 163/77   Pulse 75   Temp 98 F (36.7 C) (  Temporal)   Ht 5\' 9"  (1.753 m)   Wt 163 lb (73.9 kg)   SpO2 98%   BMI 24.07 kg/m  GEN: NAD EYE: Sclerae anicteric ENT: MMM CV: Non-tachycardic Pulm: CTA b/l GI: Soft, NT/ND NEURO:  Alert & Oriented x 3   Gerrit Heck, DO [facility]   09/07/2021 10:52 AM

## 2021-09-07 NOTE — Progress Notes (Signed)
To PACU, VSS. Report to [email or web address] 

## 2021-09-09 ENCOUNTER — Telehealth: Payer: Self-pay | Admitting: *Deleted

## 2021-09-09 NOTE — Telephone Encounter (Signed)
  Follow up Call-  Call back number 09/07/2021  Post procedure Call Back phone  # [phone]  Permission to leave phone message Yes     Patient questions:  Do you have a fever, pain , or abdominal swelling? No. Pain Score  0 *  Have you tolerated food without any problems? Yes.    Have you been able to return to your normal activities? Yes.    Do you have any questions about your discharge instructions: Diet   No. Medications  No. Follow up visit  No.  Do you have questions or concerns about your Care? No.  Actions: * If pain score is 4 or above: No action needed, pain <4.Have you developed a fever since your procedure? no  2.   Have you had an respiratory symptoms (SOB or cough) since your procedure? no  3.   Have you tested positive for COVID 19 since your procedure no  4.   Have you had any family members/close contacts diagnosed with the COVID 19 since your procedure?  no   If yes to any of these questions please route to Joylene John, RN and Joella Prince, RN

## 2021-09-20 ENCOUNTER — Encounter: Payer: Self-pay | Admitting: Gastroenterology

## 2021-10-14 ENCOUNTER — Encounter: Payer: Self-pay | Admitting: Family Medicine

## 2021-11-05 ENCOUNTER — Other Ambulatory Visit: Payer: Self-pay | Admitting: Family Medicine

## 2021-11-05 DIAGNOSIS — E785 Hyperlipidemia, unspecified: Secondary | ICD-10-CM

## 2021-11-05 DIAGNOSIS — I10 Essential (primary) hypertension: Secondary | ICD-10-CM

## 2021-11-22 ENCOUNTER — Other Ambulatory Visit: Payer: Self-pay | Admitting: Family Medicine

## 2021-12-16 ENCOUNTER — Ambulatory Visit (INDEPENDENT_AMBULATORY_CARE_PROVIDER_SITE_OTHER): Payer: Medicare Other

## 2021-12-16 VITALS — Ht 70.0 in | Wt 163.0 lb

## 2021-12-16 DIAGNOSIS — Z0000 Encounter for general adult medical examination without abnormal findings: Secondary | ICD-10-CM

## 2021-12-16 NOTE — Patient Instructions (Addendum)
Caitlin Bowers , Thank you for taking time to come for your Medicare Wellness Visit. I appreciate your ongoing commitment to your health goals. Please review the following plan we discussed and let me know if I can assist you in the future.   These are the goals we discussed:  Goals      Exercise 150 min/wk Moderate Activity        This is a list of the screening recommended for you and due dates:  Health Maintenance  Topic Date Due   Mammogram  02/02/2023   Tetanus Vaccine  02/15/2027   Colon Cancer Screening  09/07/2028   Pneumonia Vaccine  Completed   Flu Shot  Completed   DEXA scan (bone density measurement)  Completed   COVID-19 Vaccine  Completed   Hepatitis C Screening: USPSTF Recommendation to screen - Ages 77-79 yo.  Completed   Zoster (Shingles) Vaccine  Completed   HPV Vaccine  Aged Out   Advanced directives: Yes  Conditions/risks identified: None  Next appointment: Follow up in one year for your annual wellness visit    Preventive Care 65 Years and Older, Female Preventive care refers to lifestyle choices and visits with your health care provider that can promote health and wellness. What does preventive care include? A yearly physical exam. This is also called an annual well check. Dental exams once or twice a year. Routine eye exams. Ask your health care provider how often you should have your eyes checked. Personal lifestyle choices, including: Daily care of your teeth and gums. Regular physical activity. Eating a healthy diet. Avoiding tobacco and drug use. Limiting alcohol use. Practicing safe sex. Taking low-dose aspirin every day. Taking vitamin and mineral supplements as recommended by your health care provider. What happens during an annual well check? The services and screenings done by your health care provider during your annual well check will depend on your age, overall health, lifestyle risk factors, and family history of disease. Counseling   Your health care provider may ask you questions about your: Alcohol use. Tobacco use. Drug use. Emotional well-being. Home and relationship well-being. Sexual activity. Eating habits. History of falls. Memory and ability to understand (cognition). Work and work Statistician. Reproductive health. Screening  You may have the following tests or measurements: Height, weight, and BMI. Blood pressure. Lipid and cholesterol levels. These may be checked every 5 years, or more frequently if you are over 37 years old. Skin check. Lung cancer screening. You may have this screening every year starting at age 50 if you have a 30-pack-year history of smoking and currently smoke or have quit within the past 15 years. Fecal occult blood test (FOBT) of the stool. You may have this test every year starting at age 55. Flexible sigmoidoscopy or colonoscopy. You may have a sigmoidoscopy every 5 years or a colonoscopy every 10 years starting at age 1. Hepatitis C blood test. Hepatitis B blood test. Sexually transmitted disease (STD) testing. Diabetes screening. This is done by checking your blood sugar (glucose) after you have not eaten for a while (fasting). You may have this done every 1-3 years. Bone density scan. This is done to screen for osteoporosis. You may have this done starting at age 41. Mammogram. This may be done every 1-2 years. Talk to your health care provider about how often you should have regular mammograms. Talk with your health care provider about your test results, treatment options, and if necessary, the need for more tests. Vaccines  Your  health care provider may recommend certain vaccines, such as: Influenza vaccine. This is recommended every year. Tetanus, diphtheria, and acellular pertussis (Tdap, Td) vaccine. You may need a Td booster every 10 years. Zoster vaccine. You may need this after age 62. Pneumococcal 13-valent conjugate (PCV13) vaccine. One dose is recommended  after age 43. Pneumococcal polysaccharide (PPSV23) vaccine. One dose is recommended after age 63. Talk to your health care provider about which screenings and vaccines you need and how often you need them. This information is not intended to replace advice given to you by your health care provider. Make sure you discuss any questions you have with your health care provider. Document Released: 12/17/2015 Document Revised: 08/09/2016 Document Reviewed: 09/21/2015 Elsevier Interactive Patient Education  2017 [place] Prevention in the Home Falls can cause injuries. They can happen to people of all ages. There are many things you can do to make your home safe and to help prevent falls. What can I do on the outside of my home? Regularly fix the edges of walkways and driveways and fix any cracks. Remove anything that might make you trip as you walk through a door, such as a raised step or threshold. Trim any bushes or trees on the path to your home. Use bright outdoor lighting. Clear any walking paths of anything that might make someone trip, such as rocks or tools. Regularly check to see if handrails are loose or broken. Make sure that both sides of any steps have handrails. Any raised decks and porches should have guardrails on the edges. Have any leaves, snow, or ice cleared regularly. Use sand or salt on walking paths during winter. Clean up any spills in your garage right away. This includes oil or grease spills. What can I do in the bathroom? Use night lights. Install grab bars by the toilet and in the tub and shower. Do not use towel bars as grab bars. Use non-skid mats or decals in the tub or shower. If you need to sit down in the shower, use a plastic, non-slip stool. Keep the floor dry. Clean up any water that spills on the floor as soon as it happens. Remove soap buildup in the tub or shower regularly. Attach bath mats securely with double-sided non-slip rug tape. Do not  have throw rugs and other things on the floor that can make you trip. What can I do in the bedroom? Use night lights. Make sure that you have a light by your bed that is easy to reach. Do not use any sheets or blankets that are too big for your bed. They should not hang down onto the floor. Have a firm chair that has side arms. You can use this for support while you get dressed. Do not have throw rugs and other things on the floor that can make you trip. What can I do in the kitchen? Clean up any spills right away. Avoid walking on wet floors. Keep items that you use a lot in easy-to-reach places. If you need to reach something above you, use a strong step stool that has a grab bar. Keep electrical cords out of the way. Do not use floor polish or wax that makes floors slippery. If you must use wax, use non-skid floor wax. Do not have throw rugs and other things on the floor that can make you trip. What can I do with my stairs? Do not leave any items on the stairs. Make sure that there are handrails  on both sides of the stairs and use them. Fix handrails that are broken or loose. Make sure that handrails are as long as the stairways. Check any carpeting to make sure that it is firmly attached to the stairs. Fix any carpet that is loose or worn. Avoid having throw rugs at the top or bottom of the stairs. If you do have throw rugs, attach them to the floor with carpet tape. Make sure that you have a light switch at the top of the stairs and the bottom of the stairs. If you do not have them, ask someone to add them for you. What else can I do to help prevent falls? Wear shoes that: Do not have high heels. Have rubber bottoms. Are comfortable and fit you well. Are closed at the toe. Do not wear sandals. If you use a stepladder: Make sure that it is fully opened. Do not climb a closed stepladder. Make sure that both sides of the stepladder are locked into place. Ask someone to hold it for  you, if possible. Clearly mark and make sure that you can see: Any grab bars or handrails. First and last steps. Where the edge of each step is. Use tools that help you move around (mobility aids) if they are needed. These include: Canes. Walkers. Scooters. Crutches. Turn on the lights when you go into a dark area. Replace any light bulbs as soon as they burn out. Set up your furniture so you have a clear path. Avoid moving your furniture around. If any of your floors are uneven, fix them. If there are any pets around you, be aware of where they are. Review your medicines with your doctor. Some medicines can make you feel dizzy. This can increase your chance of falling. Ask your doctor what other things that you can do to help prevent falls. This information is not intended to replace advice given to you by your health care provider. Make sure you discuss any questions you have with your health care provider. Document Released: 09/16/2009 Document Revised: 04/27/2016 Document Reviewed: 12/25/2014 Elsevier Interactive Patient Education  2017 [employer].

## 2021-12-16 NOTE — Progress Notes (Signed)
Subjective:   Caitlin Bowers is a 71 y.o. female who presents for Medicare Annual (Subsequent) preventive examination.  Review of Systems    No ROS Cardiac Risk Factors include: advanced age (>55men, >45 women);hypertension    Objective:    Today's Vitals   12/16/21 1350  Weight: 163 lb (73.9 kg)  Height: 5\' 10"  (1.778 m)   Body mass index is 23.39 kg/m.  Advanced Directives 12/16/2021 05/22/2021 12/15/2020  Does Patient Have a Medical Advance Directive? Yes No No  Type of Paramedic of [place];Living will - -  Does patient want to make changes to medical advance directive? No - Patient declined - -  Copy of [place] in Chart? No - copy requested - -  Would patient like information on creating a medical advance directive? - No - Patient declined No - Patient declined    Current Medications (verified) Outpatient Encounter Medications as of 12/16/2021  Medication Sig   atenolol (TENORMIN) 25 MG tablet TAKE 1 TABLET BY MOUTH EVERY DAY   atorvastatin (LIPITOR) 40 MG tablet TAKE 1 TABLET BY MOUTH EVERYDAY AT BEDTIME   Calcium Carb-Cholecalciferol (CALCIUM 1000 + D PO) Take 2 tablets by mouth daily.   [facility] SODIUM CHLORIDE 5 % ophthalmic solution daily as needed.   estradiol (ESTRACE) 0.1 MG/GM vaginal cream Place 1 Applicatorful vaginally 3 (three) times a week.   fluticasone (FLONASE) 50 MCG/ACT nasal spray Place into the nose.   GLUCOSAMINE-CHONDROITIN PO Take 2 capsules by mouth daily. 750-100//125-1.65   Methylcellulose, Laxative, (CITRUCEL) 500 MG TABS Take 2,000 mg by mouth daily.   MYRBETRIQ 50 MG TB24 tablet TAKE 1 TABLET BY MOUTH EVERY DAY   Probiotic Product (PROBIOTIC PO) Take 1 tablet by mouth daily.   triamterene-hydrochlorothiazide (MAXZIDE-25) 37.5-25 MG tablet TAKE 1 TABLET BY MOUTH EVERY DAY   No facility-administered encounter medications on file as of 12/16/2021.    Allergies (verified) Oxybutynin,  Penicillins, and Sulfa antibiotics   History: Past Medical History:  Diagnosis Date   Arthritis    RIGHT shoulder, bilateral hands   Cataract    bilateral sx   Graves disease    in remission as of 08/24/2021   Hyperlipidemia    on meds   Hypertension    on meds   Seasonal allergies    Urgency of urination    Past Surgical History:  Procedure Laterality Date   CATARACT EXTRACTION, BILATERAL Bilateral 02/2020   CESAREAN SECTION  1979   COLONOSCOPY  2017   UTSW, tics, 2 polyps, TA x 1, 5 yr recall   POLYPECTOMY  2017   TA x 1   TONSILLECTOMY  1957   Family History  Adopted: Yes  Family history unknown: Yes   Social History   Socioeconomic History   Marital status: Married    Spouse name: Not on file   Number of children: Not on file   Years of education: Not on file   Highest education level: Not on file  Occupational History   Not on file  Tobacco Use   Smoking status: Never   Smokeless tobacco: Never  Vaping Use   Vaping Use: Never used  Substance and Sexual Activity   Alcohol use: Yes    Alcohol/week: 7.0 standard drinks    Types: 7 Shots of liquor per week    Comment: per week   Drug use: Never   Sexual activity: Yes  Other Topics Concern   Not on file  Social  History Narrative   Not on file   Social Determinants of Health   Financial Resource Strain: Low Risk    Difficulty of Paying Living Expenses: Not hard at all  Food Insecurity: No Food Insecurity   Worried About Charity fundraiser in the Last Year: Never true   [place] in the Last Year: Never true  Transportation Needs: No Transportation Needs   Lack of Transportation (Medical): No   Lack of Transportation (Non-Medical): No  Physical Activity: Sufficiently Active   Days of Exercise per Week: 5 days   Minutes of Exercise per Session: 60 min  Stress: No Stress Concern Present   Feeling of Stress : Not at all  Social Connections: Socially Integrated   Frequency of Communication  with Friends and Family: More than three times a week   Frequency of Social Gatherings with Friends and Family: More than three times a week   Attends Religious Services: More than 4 times per year   Active Member of [employer] or Organizations: Yes   Attends Music therapist: More than 4 times per year   Marital Status: Married    Clinical Intake:  Pre-visit preparation completed: Yes Diabetic? No  Interpreter Needed?: NoActivities of Daily Living In your present state of health, do you have any difficulty performing the following activities: 12/16/2021  Hearing? N  Vision? N  Difficulty concentrating or making decisions? N  Walking or climbing stairs? N  Dressing or bathing? N  Doing errands, shopping? N  Preparing Food and eating ? N  Using the Toilet? N  In the past six months, have you accidently leaked urine? N  Do you have problems with loss of bowel control? N  Managing your Medications? N  Managing your Finances? N  Housekeeping or managing your Housekeeping? N  Some recent data might be hidden    Patient Care Team: Caren Macadam, MD as PCP - General (Family Medicine) Ulla Gallo, MD as Consulting Physician (Dermatology)  Indicate any recent Medical Services you may have received from other than [facility] providers in the past year (date may be approximate).     Assessment:   This is a routine wellness examination for [name].  Virtual Visit via Telephone Note  I connected with  Janiaya Ryser Badal on 12/16/21 at  1:45 PM EST by telephone and verified that I am speaking with the correct person using two identifiers.  Location: Patient: Home Provider: Office Persons participating in the virtual visit: patient/Nurse Health Advisor   I discussed the limitations, risks, security and privacy concerns of performing an evaluation and management service by telephone and the availability of in person appointments. The patient expressed understanding  and agreed to proceed.  Interactive audio and video telecommunications were attempted between this nurse and patient, however failed, due to patient having technical difficulties OR patient did not have access to video capability.  We continued and completed visit with audio only.  Some vital signs may be absent or patient reported.   Criselda Peaches, LPN   Hearing/Vision screen Hearing Screening - Comments:: No difficulty hearing Vision Screening - Comments:: Wears reading glasses. Followed by Dr Celene Squibb  Dietary issues and exercise activities discussed: Current Exercise Habits: Home exercise routine, Type of exercise: walking, Time (Minutes): 50, Frequency (Times/Week): 5, Weekly Exercise (Minutes/Week): 250, Intensity: Moderate   Goals Addressed             This Visit's Progress    Exercise 150 min/wk  Moderate Activity         Depression Screen PHQ 2/9 Scores 12/16/2021 02/18/2021 12/15/2020  PHQ - 2 Score 0 0 0  PHQ- 9 Score - 0 0    Fall Risk Fall Risk  12/16/2021 02/18/2021 12/15/2020  Falls in the past year? 0 0 0  Number falls in past yr: 0 0 0  Injury with Fall? 0 - 0  Risk for fall due to : - - No Fall Risks  Follow up - - Falls evaluation completed;Falls prevention discussed    FALL RISK PREVENTION PERTAINING TO THE HOME:  Any stairs in or around the home? Yes  If so, are there any without handrails? No  Home free of loose throw rugs in walkways, pet beds, electrical cords, etc? Yes  Adequate lighting in your home to reduce risk of falls? Yes   ASSISTIVE DEVICES UTILIZED TO PREVENT FALLS:  Life alert? No  Use of a cane, walker or w/c? No  Grab bars in the bathroom? No  Shower chair or bench in shower? No  Elevated toilet seat or a handicapped toilet? No   TIMED UP AND GO:  Was the test performed? No . Audio Visit  Cognitive Function:     Immunizations Immunization History  Administered Date(s) Administered   Influenza, High Dose Seasonal PF  08/17/2019, 08/10/2021   Influenza-Unspecified 10/23/2006, 10/08/2009, 09/13/2010, 08/30/2011, 09/03/2014, 08/07/2015, 03/04/2016, 08/04/2016, 09/03/2016, 07/30/2017, 08/10/2018, 08/11/2019, 08/06/2020, 08/10/2021   PFIZER(Purple Top)SARS-COV-2 Vaccination 12/23/2019, 01/12/2020, 08/11/2020, 03/15/2021   PNEUMOCOCCAL CONJUGATE-20 08/10/2021   Pfizer Covid-19 Vaccine Bivalent Booster 68yrs & up 10/14/2021   Pneumococcal Conjugate-13 10/01/2014   Pneumococcal Polysaccharide-23 02/04/2016   Tdap 02/14/2017   Zoster Recombinat (Shingrix) 02/13/2018, 06/23/2018   Zoster, Live 08/31/2011    Screening Tests Health Maintenance  Topic Date Due   MAMMOGRAM  02/02/2023   TETANUS/TDAP  02/15/2027   COLONOSCOPY (Pts 45-14yrs Insurance coverage will need to be confirmed)  09/07/2028   Pneumonia Vaccine 76+ Years old  Completed   INFLUENZA VACCINE  Completed   DEXA SCAN  Completed   COVID-19 Vaccine  Completed   Hepatitis C Screening  Completed   Zoster Vaccines- Shingrix  Completed   HPV VACCINES  Aged Out    Health Maintenance  There are no preventive care reminders to display for this patient.  Additional Screening:   Vision Screening: Recommended annual ophthalmology exams for early detection of glaucoma and other disorders of the eye. Is the patient up to date with their annual eye exam?  Yes  Who is the provider or what is the name of the office in which the patient attends annual eye exams? Followed by Dr Celene Squibb  Dental Screening: Recommended annual dental exams for proper oral hygiene  Community Resource Referral / Chronic Care Management:  CRR required this visit?  No   CCM required this visit?  No      Plan:     I have personally reviewed and noted the following in the patients chart:   Medical and social history Use of alcohol, tobacco or illicit drugs Patient currently not taking opioids Current medications and supplements including opioid prescriptions.   Functional ability and status Nutritional status Physical activity Advanced directives List of other physicians Hospitalizations, surgeries, and ER visits in previous 12 months Vitals Screenings to include cognitive, depression, and falls Referrals and appointments  In addition, I have reviewed and discussed with patient certain preventive protocols, quality metrics, and best practice recommendations. A written personalized care plan for  preventive services as well as general preventive health recommendations were provided to patient.     Criselda Peaches, LPN   12/14/347

## 2021-12-20 ENCOUNTER — Other Ambulatory Visit: Payer: Self-pay | Admitting: Family Medicine

## 2021-12-20 DIAGNOSIS — Z1231 Encounter for screening mammogram for malignant neoplasm of breast: Secondary | ICD-10-CM

## 2022-02-05 ENCOUNTER — Inpatient Hospital Stay (HOSPITAL_BASED_OUTPATIENT_CLINIC_OR_DEPARTMENT_OTHER)
Admission: EM | Admit: 2022-02-05 | Discharge: 2022-02-13 | DRG: 749 | Disposition: A | Discharge status: Home / Self Care | Payer: Medicare Other | Attending: Surgery | Admitting: Surgery

## 2022-02-05 ENCOUNTER — Encounter (HOSPITAL_BASED_OUTPATIENT_CLINIC_OR_DEPARTMENT_OTHER): Payer: Self-pay | Admitting: Emergency Medicine

## 2022-02-05 ENCOUNTER — Other Ambulatory Visit: Payer: Self-pay

## 2022-02-05 ENCOUNTER — Encounter (HOSPITAL_COMMUNITY): Admission: EM | Disposition: A | Discharge status: Home / Self Care | Payer: Self-pay | Source: Home / Self Care

## 2022-02-05 ENCOUNTER — Emergency Department (HOSPITAL_BASED_OUTPATIENT_CLINIC_OR_DEPARTMENT_OTHER): Payer: Medicare Other

## 2022-02-05 DIAGNOSIS — N179 Acute kidney failure, unspecified: Secondary | ICD-10-CM | POA: Diagnosis not present

## 2022-02-05 DIAGNOSIS — Z9842 Cataract extraction status, left eye: Secondary | ICD-10-CM | POA: Diagnosis not present

## 2022-02-05 DIAGNOSIS — E785 Hyperlipidemia, unspecified: Secondary | ICD-10-CM | POA: Diagnosis present

## 2022-02-05 DIAGNOSIS — E871 Hypo-osmolality and hyponatremia: Secondary | ICD-10-CM | POA: Diagnosis not present

## 2022-02-05 DIAGNOSIS — N739 Female pelvic inflammatory disease, unspecified: Secondary | ICD-10-CM | POA: Diagnosis present

## 2022-02-05 DIAGNOSIS — Z882 Allergy status to sulfonamides status: Secondary | ICD-10-CM | POA: Diagnosis not present

## 2022-02-05 DIAGNOSIS — I10 Essential (primary) hypertension: Secondary | ICD-10-CM | POA: Diagnosis not present

## 2022-02-05 DIAGNOSIS — K429 Umbilical hernia without obstruction or gangrene: Secondary | ICD-10-CM | POA: Diagnosis present

## 2022-02-05 DIAGNOSIS — Z888 Allergy status to other drugs, medicaments and biological substances status: Secondary | ICD-10-CM

## 2022-02-05 DIAGNOSIS — Z9841 Cataract extraction status, right eye: Secondary | ICD-10-CM

## 2022-02-05 DIAGNOSIS — K631 Perforation of intestine (nontraumatic): Principal | ICD-10-CM

## 2022-02-05 DIAGNOSIS — B954 Other streptococcus as the cause of diseases classified elsewhere: Secondary | ICD-10-CM | POA: Diagnosis present

## 2022-02-05 DIAGNOSIS — E0500 Thyrotoxicosis with diffuse goiter without thyrotoxic crisis or storm: Secondary | ICD-10-CM | POA: Diagnosis not present

## 2022-02-05 DIAGNOSIS — K658 Other peritonitis: Secondary | ICD-10-CM | POA: Diagnosis not present

## 2022-02-05 DIAGNOSIS — Z9889 Other specified postprocedural states: Secondary | ICD-10-CM

## 2022-02-05 DIAGNOSIS — K567 Ileus, unspecified: Secondary | ICD-10-CM | POA: Diagnosis not present

## 2022-02-05 DIAGNOSIS — D259 Leiomyoma of uterus, unspecified: Secondary | ICD-10-CM | POA: Diagnosis present

## 2022-02-05 DIAGNOSIS — Z20822 Contact with and (suspected) exposure to covid-19: Secondary | ICD-10-CM | POA: Diagnosis not present

## 2022-02-05 DIAGNOSIS — R1084 Generalized abdominal pain: Secondary | ICD-10-CM

## 2022-02-05 DIAGNOSIS — N390 Urinary tract infection, site not specified: Secondary | ICD-10-CM | POA: Diagnosis not present

## 2022-02-05 DIAGNOSIS — Z9049 Acquired absence of other specified parts of digestive tract: Secondary | ICD-10-CM

## 2022-02-05 DIAGNOSIS — Z4659 Encounter for fitting and adjustment of other gastrointestinal appliance and device: Secondary | ICD-10-CM

## 2022-02-05 DIAGNOSIS — Z79899 Other long term (current) drug therapy: Secondary | ICD-10-CM

## 2022-02-05 DIAGNOSIS — K668 Other specified disorders of peritoneum: Secondary | ICD-10-CM | POA: Diagnosis present

## 2022-02-05 DIAGNOSIS — K9189 Other postprocedural complications and disorders of digestive system: Secondary | ICD-10-CM

## 2022-02-05 DIAGNOSIS — Z880 Allergy status to penicillin: Secondary | ICD-10-CM | POA: Diagnosis not present

## 2022-02-05 HISTORY — PX: LAPAROTOMY: SHX154

## 2022-02-05 LAB — URINALYSIS, ROUTINE W REFLEX MICROSCOPIC
Bilirubin Urine: NEGATIVE
Glucose, UA: NEGATIVE mg/dL
Ketones, ur: NEGATIVE mg/dL
Nitrite: NEGATIVE
Protein, ur: 100 mg/dL — AB
Specific Gravity, Urine: 1.019 (ref 1.005–1.030)
Trans Epithel, UA: 3
pH: 5 (ref 5.0–8.0)

## 2022-02-05 LAB — COMPREHENSIVE METABOLIC PANEL
ALT: 17 U/L (ref 0–44)
AST: 16 U/L (ref 15–41)
Albumin: 4.2 g/dL (ref 3.5–5.0)
Alkaline Phosphatase: 63 U/L (ref 38–126)
Anion gap: 12 (ref 5–15)
BUN: 32 mg/dL — ABNORMAL HIGH (ref 8–23)
CO2: 25 mmol/L (ref 22–32)
Calcium: 9.8 mg/dL (ref 8.9–10.3)
Chloride: 91 mmol/L — ABNORMAL LOW (ref 98–111)
Creatinine, Ser: 2.63 mg/dL — ABNORMAL HIGH (ref 0.44–1.00)
GFR, Estimated: 19 mL/min — ABNORMAL LOW (ref >60)
Glucose, Bld: 145 mg/dL — ABNORMAL HIGH (ref 70–99)
Potassium: 4 mmol/L (ref 3.5–5.1)
Sodium: 128 mmol/L — ABNORMAL LOW (ref 135–145)
Total Bilirubin: 2.3 mg/dL — ABNORMAL HIGH (ref 0.3–1.2)
Total Protein: 7 g/dL (ref 6.5–8.1)

## 2022-02-05 LAB — CBC
HCT: 34.4 % — ABNORMAL LOW (ref 36.0–46.0)
Hemoglobin: 11.7 g/dL — ABNORMAL LOW (ref 12.0–15.0)
MCH: 32 pg (ref 26.0–34.0)
MCHC: 34 g/dL (ref 30.0–36.0)
MCV: 94 fL (ref 80.0–100.0)
Platelets: 190 10*3/uL (ref 150–400)
RBC: 3.66 MIL/uL — ABNORMAL LOW (ref 3.87–5.11)
RDW: 13.2 % (ref 11.5–15.5)
WBC: 20.1 10*3/uL — ABNORMAL HIGH (ref 4.0–10.5)
nRBC: 0 % (ref 0.0–0.2)

## 2022-02-05 LAB — RESP PANEL BY RT-PCR (FLU A&B, COVID) ARPGX2
Influenza A by PCR: NEGATIVE
Influenza B by PCR: NEGATIVE
SARS Coronavirus 2 by RT PCR: NEGATIVE

## 2022-02-05 LAB — LACTIC ACID, PLASMA: Lactic Acid, Venous: 1.5 mmol/L (ref 0.5–1.9)

## 2022-02-05 LAB — LIPASE, BLOOD: Lipase: <10 U/L — ABNORMAL LOW (ref 11–51)

## 2022-02-05 SURGERY — LAPAROTOMY, EXPLORATORY
Anesthesia: General | Site: Abdomen

## 2022-02-05 MED ORDER — LEVOFLOXACIN IN D5W 750 MG/150ML IV SOLN
750.0000 mg | INTRAVENOUS | Status: DC
  Administered 2022-02-05 – 2022-02-09 (×3): 750 mg via INTRAVENOUS
  Filled 2022-02-05 (×3): qty 150

## 2022-02-05 MED ORDER — VANCOMYCIN HCL IN DEXTROSE 1-5 GM/200ML-% IV SOLN
1000.0000 mg | Freq: Once | INTRAVENOUS | Status: AC
  Administered 2022-02-05: 1000 mg via INTRAVENOUS
  Filled 2022-02-05: qty 200

## 2022-02-05 MED ORDER — ONDANSETRON HCL 4 MG/2ML IJ SOLN
4.0000 mg | Freq: Once | INTRAMUSCULAR | Status: AC
  Administered 2022-02-05: 4 mg via INTRAVENOUS
  Filled 2022-02-05: qty 2

## 2022-02-05 MED ORDER — FENTANYL CITRATE PF 50 MCG/ML IJ SOSY
50.0000 ug | PREFILLED_SYRINGE | Freq: Once | INTRAMUSCULAR | Status: AC
  Administered 2022-02-05: 50 ug via INTRAVENOUS
  Filled 2022-02-05: qty 1

## 2022-02-05 MED ORDER — VANCOMYCIN HCL 750 MG/150ML IV SOLN
750.0000 mg | INTRAVENOUS | Status: DC
  Administered 2022-02-06 – 2022-02-07 (×2): 750 mg via INTRAVENOUS
  Filled 2022-02-05 (×4): qty 150

## 2022-02-05 MED ORDER — SODIUM CHLORIDE 0.9 % IV BOLUS
1000.0000 mL | Freq: Once | INTRAVENOUS | Status: AC
Start: 2022-02-05 — End: 2022-02-05
  Administered 2022-02-05: 1000 mL via INTRAVENOUS

## 2022-02-05 SURGICAL SUPPLY — 60 items
ADH SKN CLS APL DERMABOND .7 (GAUZE/BANDAGES/DRESSINGS) ×1
APL PRP STRL LF DISP 70% ISPRP (MISCELLANEOUS) ×1
BLADE CLIPPER SURG (BLADE) IMPLANT
CANISTER SUCT 3000ML PPV (MISCELLANEOUS) ×2 IMPLANT
CHLORAPREP W/TINT 26 (MISCELLANEOUS) ×2 IMPLANT
CNTNR URN SCR LID CUP LEK RST (MISCELLANEOUS) IMPLANT
CONT SPEC 4OZ STRL OR WHT (MISCELLANEOUS) ×2
COVER SURGICAL LIGHT HANDLE (MISCELLANEOUS) ×2 IMPLANT
DERMABOND ADVANCED (GAUZE/BANDAGES/DRESSINGS) ×1
DERMABOND ADVANCED .7 DNX12 (GAUZE/BANDAGES/DRESSINGS) IMPLANT
DRAIN CHANNEL 19F RND (DRAIN) ×1 IMPLANT
DRAIN PENROSE 12X.25 LTX STRL (MISCELLANEOUS) ×1 IMPLANT
DRAPE LAPAROSCOPIC ABDOMINAL (DRAPES) ×2 IMPLANT
DRAPE WARM FLUID 44X44 (DRAPES) ×2 IMPLANT
DRSG OPSITE POSTOP 4X10 (GAUZE/BANDAGES/DRESSINGS) IMPLANT
DRSG OPSITE POSTOP 4X12 (GAUZE/BANDAGES/DRESSINGS) ×1 IMPLANT
DRSG OPSITE POSTOP 4X8 (GAUZE/BANDAGES/DRESSINGS) IMPLANT
DRSG PAD ABDOMINAL 8X10 ST (GAUZE/BANDAGES/DRESSINGS) ×2 IMPLANT
ELECT BLADE 6.5 EXT (BLADE) IMPLANT
ELECT CAUTERY BLADE 6.4 (BLADE) ×2 IMPLANT
ELECT REM PT RETURN 9FT ADLT (ELECTROSURGICAL) ×2
ELECTRODE REM PT RTRN 9FT ADLT (ELECTROSURGICAL) ×1 IMPLANT
EVACUATOR SILICONE 100CC (DRAIN) ×1 IMPLANT
GAUZE SPONGE 4X4 12PLY STRL (GAUZE/BANDAGES/DRESSINGS) ×1 IMPLANT
GLOVE SRG 8 PF TXTR STRL LF DI (GLOVE) ×1 IMPLANT
GLOVE SURG ENC MOIS LTX SZ7.5 (GLOVE) ×3 IMPLANT
GLOVE SURG PR MICRO ENCORE 7 (GLOVE) ×2 IMPLANT
GLOVE SURG UNDER POLY LF SZ7 (GLOVE) ×3 IMPLANT
GLOVE SURG UNDER POLY LF SZ8 (GLOVE) ×4
GOWN STRL REUS W/ TWL LRG LVL3 (GOWN DISPOSABLE) ×1 IMPLANT
GOWN STRL REUS W/ TWL XL LVL3 (GOWN DISPOSABLE) ×1 IMPLANT
GOWN STRL REUS W/TWL LRG LVL3 (GOWN DISPOSABLE) ×4
GOWN STRL REUS W/TWL XL LVL3 (GOWN DISPOSABLE) ×4
HANDLE SUCTION POOLE (INSTRUMENTS) ×1 IMPLANT
KIT BASIN OR (CUSTOM PROCEDURE TRAY) ×2 IMPLANT
KIT SIGMOIDOSCOPE (SET/KITS/TRAYS/PACK) ×1 IMPLANT
KIT TURNOVER KIT B (KITS) ×2 IMPLANT
LIGASURE IMPACT 36 18CM CVD LR (INSTRUMENTS) IMPLANT
NS IRRIG 1000ML POUR BTL (IV SOLUTION) ×4 IMPLANT
PACK GENERAL/GYN (CUSTOM PROCEDURE TRAY) ×2 IMPLANT
PAD ARMBOARD 7.5X6 YLW CONV (MISCELLANEOUS) ×3 IMPLANT
PENCIL SMOKE EVACUATOR (MISCELLANEOUS) ×2 IMPLANT
SPECIMEN JAR LARGE (MISCELLANEOUS) IMPLANT
SPONGE T-LAP 18X18 ~~LOC~~+RFID (SPONGE) ×1 IMPLANT
STAPLER VISISTAT 35W (STAPLE) ×2 IMPLANT
SUCTION POOLE HANDLE (INSTRUMENTS) ×2
SUT ETHILON 2 0 FS 18 (SUTURE) ×2 IMPLANT
SUT MNCRL AB 4-0 PS2 18 (SUTURE) ×1 IMPLANT
SUT PDS AB 1 TP1 54 (SUTURE) ×4 IMPLANT
SUT PDS AB 2-0 CT1 27 (SUTURE) ×2 IMPLANT
SUT SILK 2 0 SH CR/8 (SUTURE) ×2 IMPLANT
SUT SILK 2 0 TIES 10X30 (SUTURE) ×2 IMPLANT
SUT SILK 3 0 SH CR/8 (SUTURE) ×2 IMPLANT
SUT SILK 3 0 TIES 10X30 (SUTURE) ×2 IMPLANT
SUT VIC AB 2-0 SH 18 (SUTURE) ×1 IMPLANT
SUT VIC AB 2-0 SH 27 (SUTURE) ×2
SUT VIC AB 2-0 SH 27X BRD (SUTURE) IMPLANT
TOWEL GREEN STERILE (TOWEL DISPOSABLE) ×2 IMPLANT
TRAY FOLEY MTR SLVR 16FR STAT (SET/KITS/TRAYS/PACK) ×2 IMPLANT
YANKAUER SUCT BULB TIP NO VENT (SUCTIONS) IMPLANT

## 2022-02-05 NOTE — ED Notes (Signed)
Patient transported to CT 

## 2022-02-05 NOTE — ED Provider Notes (Signed)
[facility] EMERGENCY DEPT Provider Note   CSN: [id number] Arrival date & time: 02/05/22  1936     History  Chief Complaint  Patient presents with   Abdominal Pain    Caitlin Bowers is a 71 y.o. female.  The history is provided by the patient, the spouse and medical records. No language interpreter was used.  Abdominal Pain Pain location:  Generalized Pain quality: aching, fullness and pressure   Pain radiates to:  Does not radiate Pain severity:  Severe Onset quality:  Gradual Duration:  2 days Timing:  Constant Progression:  Worsening Chronicity:  New Context: not previous surgeries, not recent illness, not sick contacts, not suspicious food intake and not trauma   Relieved by:  Nothing Worsened by:  Palpation and eating Associated symptoms: anorexia, constipation, diarrhea, fatigue, flatus, nausea and vomiting   Associated symptoms: no chest pain, no chills, no cough, no dysuria, no fever, no melena and no shortness of breath       Home Medications Prior to Admission medications   Medication Sig Start Date End Date Taking? Authorizing Provider  atenolol (TENORMIN) 25 MG tablet TAKE 1 TABLET BY MOUTH EVERY DAY 11/07/21   Koberlein, Andris Flurry C, MD  atorvastatin (LIPITOR) 40 MG tablet TAKE 1 TABLET BY MOUTH EVERYDAY AT BEDTIME 11/07/21   Koberlein, Junell C, MD  Calcium Carb-Cholecalciferol (CALCIUM 1000 + D PO) Take 2 tablets by mouth daily.    [provider]  [facility]S SODIUM CHLORIDE 5 % ophthalmic solution daily as needed. 05/19/21   [provider]  estradiol (ESTRACE) 0.1 MG/GM vaginal cream Place 1 Applicatorful vaginally 3 (three) times a week. 08/22/21   Koberlein, Steele Berg, MD  fluticasone (FLONASE) 50 MCG/ACT nasal spray Place into the nose. 11/23/11   [provider]  GLUCOSAMINE-CHONDROITIN PO Take 2 capsules by mouth daily. 750-100//125-1.65    [provider]  Methylcellulose, Laxative, (CITRUCEL) 500 MG TABS  Take 2,000 mg by mouth daily.    [provider]  MYRBETRIQ 50 MG TB24 tablet TAKE 1 TABLET BY MOUTH EVERY DAY 11/22/21   Caren Macadam, MD  Probiotic Product (PROBIOTIC PO) Take 1 tablet by mouth daily.    [provider]  triamterene-hydrochlorothiazide (MAXZIDE-25) 37.5-25 MG tablet TAKE 1 TABLET BY MOUTH EVERY DAY 11/07/21   Caren Macadam, MD      Allergies    Oxybutynin, Penicillins, and Sulfa antibiotics    Review of Systems   Review of Systems  Constitutional:  Positive for fatigue. Negative for chills and fever.  HENT:  Negative for congestion.   Eyes:  Negative for visual disturbance.  Respiratory:  Negative for cough, chest tightness, shortness of breath and wheezing.   Cardiovascular:  Negative for chest pain, palpitations and leg swelling.  Gastrointestinal:  Positive for abdominal distention, abdominal pain, anorexia, constipation, diarrhea, flatus, nausea and vomiting. Negative for blood in stool and melena.  Genitourinary:  Negative for dysuria.  Musculoskeletal:  Negative for back pain, neck pain and neck stiffness.  Skin:  Negative for wound.  Neurological:  Negative for headaches.  Psychiatric/Behavioral:  Negative for agitation.   All other systems reviewed and are negative.  Physical Exam Updated Vital Signs BP (!) 110/57    Pulse 77    Temp 98.4 F (36.9 C)    Resp (!) 21    Ht '5\' 10"'$  (1.778 m)    Wt 72.6 kg    SpO2 96%    BMI 22.96 kg/m  Physical Exam Vitals  and nursing note reviewed.  Constitutional:      General: She is not in acute distress.    Appearance: She is well-developed. She is ill-appearing. She is not toxic-appearing or diaphoretic.  HENT:     Head: Normocephalic and atraumatic.  Eyes:     General: No scleral icterus.    Extraocular Movements: Extraocular movements intact.     Conjunctiva/sclera: Conjunctivae normal.  Cardiovascular:     Rate and Rhythm: Normal rate and regular rhythm.     Heart sounds: Normal  heart sounds. No murmur heard. Pulmonary:     Effort: Pulmonary effort is normal. No respiratory distress.     Breath sounds: Normal breath sounds. No wheezing, rhonchi or rales.  Chest:     Chest wall: No tenderness.  Abdominal:     General: Bowel sounds are decreased. There is distension.     Palpations: Abdomen is soft.     Tenderness: There is generalized abdominal tenderness. There is guarding. There is no right CVA tenderness, left CVA tenderness or rebound.  Musculoskeletal:        General: No swelling.     Cervical back: Neck supple.  Skin:    General: Skin is warm and dry.     Capillary Refill: Capillary refill takes less than 2 seconds.  Neurological:     General: No focal deficit present.     Mental Status: She is alert.  Psychiatric:        Mood and Affect: Mood normal.    ED Results / Procedures / Treatments   Labs (all labs ordered are listed, but only abnormal results are displayed) Labs Reviewed  LIPASE, BLOOD - Abnormal; Notable for the following components:      Result Value   Lipase <10 (*)    All other components within normal limits  COMPREHENSIVE METABOLIC PANEL - Abnormal; Notable for the following components:   Sodium 128 (*)    Chloride 91 (*)    Glucose, Bld 145 (*)    BUN 32 (*)    Creatinine, Ser 2.63 (*)    Total Bilirubin 2.3 (*)    GFR, Estimated 19 (*)    All other components within normal limits  CBC - Abnormal; Notable for the following components:   WBC 20.1 (*)    RBC 3.66 (*)    Hemoglobin 11.7 (*)    HCT 34.4 (*)    All other components within normal limits  URINALYSIS, ROUTINE W REFLEX MICROSCOPIC - Abnormal; Notable for the following components:   APPearance CLOUDY (*)    Hgb urine dipstick TRACE (*)    Protein, ur 100 (*)    Leukocytes,Ua LARGE (*)    Bacteria, UA FEW (*)    All other components within normal limits  RESP PANEL BY RT-PCR (FLU A&B, COVID) ARPGX2  URINE CULTURE  CULTURE, BLOOD (ROUTINE X 2)  CULTURE, BLOOD  (ROUTINE X 2)  LACTIC ACID, PLASMA  LACTIC ACID, PLASMA    EKG EKG Interpretation  Date/Time:  Sunday February 05 2022 19:51:58 EST Ventricular Rate:  91 PR Interval:  164 QRS Duration: 88 QT Interval:  338 QTC Calculation: 415 R Axis:   75 Text Interpretation: Normal sinus rhythm Nonspecific T wave abnormality Abnormal ECG No previous ECGs available No prior ECG for comparison. No STEMI Confirmed by Antony Blackbird [phone]) on 02/05/2022 7:56:20 PM  Radiology CT ABDOMEN PELVIS WO CONTRAST  Result Date: 02/05/2022 CLINICAL DATA:  Severe abdominal pain with nausea and vomiting. EXAM: CT ABDOMEN  AND PELVIS WITHOUT CONTRAST TECHNIQUE: Multidetector CT imaging of the abdomen and pelvis was performed following the standard protocol without IV contrast. RADIATION DOSE REDUCTION: This exam was performed according to the departmental dose-optimization program which includes automated exposure control, adjustment of the mA and/or kV according to patient size and/or use of iterative reconstruction technique. COMPARISON:  CT abdomen and pelvis 12/03/2019 FINDINGS: Lower chest: No acute abnormality. Hepatobiliary: There is fatty infiltration of the liver. Gallbladder and bile ducts are within normal limits. Previously identified hepatic cysts are better seen on contrast enhanced study. Pancreas: Unremarkable. No pancreatic ductal dilatation or surrounding inflammatory changes. Spleen: Normal in size without focal abnormality. Adrenals/Urinary Tract: Adrenal glands are unremarkable. Kidneys are normal, without renal calculi, focal lesion, or hydronephrosis. Bladder is unremarkable. Stomach/Bowel: There is a small hiatal hernia. Stomach is moderately distended with air-fluid levels. Small bowel loops are diffusely distended with air-fluid levels throughout the mid and upper abdomen. These measure less than 3 cm. Definitive transition point visualized, but there are decompressed ileal loops in the right lower quadrant.  Colon is nondilated. Appendix within normal limits. There is a small amount of free air throughout the abdomen and pelvis, slightly clustered in the right lower quadrant. Site of bowel perforation is unclear. There is significant sigmoid colon diverticulosis with questionable mild diverticulitis in the right pelvis. Vascular/Lymphatic: Aortic atherosclerosis. No enlarged abdominal or pelvic lymph nodes. Reproductive: Calcified uterine fibroids are identified measuring up to 4.1 cm. Ovaries are not well delineated on this study. Other: Small amount of free fluid in the pelvis. Small fat containing umbilical hernia. Musculoskeletal: Degenerative changes affect the spine. IMPRESSION: 1. Small amount of free air throughout the abdomen and pelvis compatible with bowel perforation. Exact site of perforation is unknown, but there is free air clustered in the right lower quadrant surrounding ileal loops. 2. Findings suspicious for early or partial distal small-bowel obstruction. Definitive transition point visualized. 3. Sigmoid colon diverticulosis. Questionable mild sigmoid colon diverticulitis. 4.  Small amount of free fluid in the pelvis. 5.  Fibroid uterus. 6.Aortic Atherosclerosis (ICD10-I70.0). These results were called by telephone at the time of interpretation on 02/05/2022 at 10:26 pm to provider [facility] , who verbally acknowledged these results. Electronically Signed   By: Ronney Asters M.D.   On: 02/05/2022 22:29    Procedures Procedures    CRITICAL CARE Performed by: Gwenyth Allegra Millena Callins Total critical care time: 45 minutes Critical care time was exclusive of separately billable procedures and treating other patients. Critical care was necessary to treat or prevent imminent or life-threatening deterioration. Critical care was time spent personally by me on the following activities: development of treatment plan with patient and/or surrogate as well as nursing, discussions with consultants,  evaluation of patient's response to treatment, examination of patient, obtaining history from patient or surrogate, ordering and performing treatments and interventions, ordering and review of laboratory studies, ordering and review of radiographic studies, pulse oximetry and re-evaluation of patient's condition.   Medications Ordered in ED Medications  levofloxacin (LEVAQUIN) IVPB 750 mg ( Intravenous Automatically Held 02/21/22 2300)  vancomycin (VANCOREADY) IVPB 750 mg/150 mL ( Intravenous Automatically Held 02/14/22 2200)  sodium chloride 0.9 % bolus 1,000 mL (1,000 mLs Intravenous New Bag/Given 02/05/22 2238)  fentaNYL (SUBLIMAZE) injection 50 mcg (50 mcg Intravenous Given 02/05/22 2233)  ondansetron (ZOFRAN) injection 4 mg (4 mg Intravenous Given 02/05/22 2233)  vancomycin (VANCOCIN) IVPB 1000 mg/200 mL premix (1,000 mg Intravenous New Bag/Given 02/05/22 2257)  fentaNYL (SUBLIMAZE) injection 50  mcg (50 mcg Intravenous Given 02/05/22 2329)    ED Course/ Medical Decision Making/ A&P                           Medical Decision Making Amount and/or Complexity of Data Reviewed Labs: ordered. Radiology: ordered.  Risk Prescription drug management. Decision regarding hospitalization.    Andretta Ergle Lutzke is a 71 y.o. female with a past medical history significant for Graves' disease, hypertension, hyperlipidemia who presents with nausea, vomiting, initial diarrhea followed by constipation and decreased flatus, and severe abdominal pain.  According to patient and husband, they were traveling to [facility] and yesterday patient started having nausea, vomiting, diarrhea, and abdominal pain.  She reports that waxed and waned but after initial diarrhea, she has not diarrhea.  She reports has not passed gas today or had any bowel movements.  She reports the pain is severe.  She denies any blood in her emesis or stools and denies trauma.  No history of abdominal surgeries or problems.  She denies  fevers, chills, chest pain, shortness of breath, palpitations, or syncope.  Denies recent medication changes.  Denies any suspicious food or contacts.  She reports the pain worsened this afternoon prompting her to seek evaluation.  On exam, patient has distended tender abdomen.  Bowel sounds are diminished.  Lungs were clear and chest was nontender but patient was ill-appearing.  No focal neurologic deficits.  Patient does not appear erythematous or pale.  Back and flanks nontender.  Clinically I am concerned about bowel obstruction of some kind given the now decreased flatus, no bowel movement, distention, nausea, vomiting, and severe pain.  We will get screening labs, she will be n.p.o., and we will get CT imaging.  Patient was given some pain medicine and nausea medicine to help symptoms.  Symptoms began to improve after medications however CT scan shows evidence of perforated bowel and bowel obstruction.  Labs show acute kidney injury.  I spoke with Dr. Riley Lam sheltie with general surgery who requests she be transferred emergently and immediately to the preoperative area at [facility] for likely procedure to explore perforation.  She will be given antibiotics.  I spoke to pharmacy who recommended Levaquin and vancomycin given her allergies and intolerances.  Patient transferred for surgical management and admission for severe abdominal pain and perforated bowel.         Final Clinical Impression(s) / ED Diagnoses Final diagnoses:  Bowel perforation (HCC)  Generalized abdominal pain     Clinical Impression: 1. Bowel perforation ([place])   2. Generalized abdominal pain     Disposition: Admit  This note was prepared with assistance of Dragon voice recognition software. Occasional wrong-word or sound-a-like substitutions may have occurred due to the inherent limitations of voice recognition software.     Aizlyn Schifano, Gwenyth Allegra, MD 02/06/22 0010

## 2022-02-05 NOTE — Progress Notes (Signed)
Pharmacy Antibiotic Note ? ?Caitlin Bowers is a 71 y.o. female admitted on 02/05/2022 with  abdominal pain .  Pharmacy has been consulted for vancomycin and levaquin dosing. ? ?Patient currently afebrile, wbc elevated at 20. Also note significant AKI with scr of 2.6. Patient with abdominal pain and ill appearing per ED physician. Given severe hives with penicillins will empirically start vancomycin and levaquin. Do not see that patient has ever received cephalosporins.  ? ?Will not use AUC dosing given AKI.  ? ? ?Plan: ?Vancomycin 1g IV now then will plan on '750mg'$  IV every 24 hours. Goal trough 15-20 mcg/mL. ?Levaquin '750mg'$  IV q48 hours - first dose now ?Will need to watch renal function closely for dose adjustments ? ?Height: '5\' 10"'$  (177.8 cm) ?Weight: 72.6 kg (160 lb) ?IBW/kg (Calculated) : 68.5 ? ?Temp (24hrs), Avg:98.4 ?F (36.9 ?C), Min:98.4 ?F (36.9 ?C), Max:98.4 ?F (36.9 ?C) ? ?Recent Labs  ?Lab 02/05/22 ?1957  ?WBC 20.1*  ?CREATININE 2.63*  ?  ?Estimated Creatinine Clearance: 20.9 mL/min (A) (by C-G formula based on SCr of 2.63 mg/dL (H)).   ? ?Allergies  ?Allergen Reactions  ? Oxybutynin   ?  Dry mouth; no taste; not effective for incontinence  ? Penicillins Hives  ? Sulfa Antibiotics Hives  ?Thank you for allowing pharmacy to be a part of this patient?s care. ? ?Erin Hearing PharmD., BCPS ?Clinical Pharmacist ?02/05/2022 11:03 PM ? ?

## 2022-02-05 NOTE — ED Notes (Signed)
Lab notified of urine culture add on 

## 2022-02-05 NOTE — H&P (Signed)
? ?Admitting Physician: Nickola Major Junius Faucett ? ?Service: General Surgery ? ?CC: Abdominal pain ? ?Subjective  ? ?HPI: ?Caitlin Bowers is an 71 y.o. female who is here for abdominal pain.  The pain started a day or so ago when she was flying back from [place].  The abdominal pain was very severe and located all over her abdomen.  She presented to [place] then was transferred to the perioperative area after being found to have free air on CT. ? ?She had a colonoscopy with Dr. Bryan Lemma last October.  There were a few polyps and some diverticulosis.  She denies using NSAIDs.  She has never been told she had a. Fib or other heart rhythm issues.  She has not had clotting issues in the past. ? ?Past Medical History:  ?Diagnosis Date  ? Arthritis   ? RIGHT shoulder, bilateral hands  ? Cataract   ? bilateral sx  ? Graves disease   ? in remission as of 08/24/2021  ? Hyperlipidemia   ? on meds  ? Hypertension   ? on meds  ? Seasonal allergies   ? Urgency of urination   ? ? ?Past Surgical History:  ?Procedure Laterality Date  ? CATARACT EXTRACTION, BILATERAL Bilateral 02/2020  ? [place]  ? COLONOSCOPY  2017  ? UTSW, tics, 2 polyps, TA x 1, 5 yr recall  ? POLYPECTOMY  2017  ? TA x 1  ? TONSILLECTOMY  1957  ? ? ?Family History  ?Adopted: Yes  ?Family history unknown: Yes  ? ? ?Social:  reports that she has never smoked. She has never used smokeless tobacco. She reports current alcohol use of about 7.0 standard drinks per week. She reports that she does not use drugs. ? ?Allergies:  ?Allergies  ?Allergen Reactions  ? Oxybutynin   ?  Dry mouth; no taste; not effective for incontinence  ? Penicillins Hives  ? Sulfa Antibiotics Hives  ? ? ?Medications: ?Current Outpatient Medications  ?Medication Instructions  ? atenolol (TENORMIN) 25 MG tablet TAKE 1 TABLET BY MOUTH EVERY DAY  ? atorvastatin (LIPITOR) 40 MG tablet TAKE 1 TABLET BY MOUTH EVERYDAY AT BEDTIME  ? Calcium Carb-Cholecalciferol  (CALCIUM 1000 + D PO) 2 tablets, Oral, Daily  ? Citrucel 2,000 mg, Oral, Daily  ? [employer] SODIUM CHLORIDE 5 % ophthalmic solution Daily PRN  ? estradiol (ESTRACE) 0.1 MG/GM vaginal cream 1 Applicatorful, Vaginal, 3 times weekly  ? fluticasone (FLONASE) 50 MCG/ACT nasal spray Nasal  ? GLUCOSAMINE-CHONDROITIN PO 2 capsules, Oral, Daily, 750-100//125-1.65   ? MYRBETRIQ 50 MG TB24 tablet TAKE 1 TABLET BY MOUTH EVERY DAY  ? Probiotic Product (PROBIOTIC PO) 1 tablet, Oral, Daily  ? triamterene-hydrochlorothiazide (MAXZIDE-25) 37.5-25 MG tablet TAKE 1 TABLET BY MOUTH EVERY DAY  ? ? ?ROS - all of the below systems have been reviewed with the patient and positives are indicated with bold text ?General: chills, fever or night sweats ?Eyes: blurry vision or double vision ?ENT: epistaxis or sore throat ?Allergy/Immunology: itchy/watery eyes or nasal congestion ?Hematologic/Lymphatic: bleeding problems, blood clots or swollen lymph nodes ?Endocrine: temperature intolerance or unexpected weight changes ?Breast: new or changing breast lumps or nipple discharge ?Resp: cough, shortness of breath, or wheezing ?CV: chest pain or dyspnea on exertion ?GI: as per HPI ?GU: dysuria, trouble voiding, or hematuria ?MSK: joint pain or joint stiffness ?Neuro: TIA or stroke symptoms ?Derm: pruritus and skin lesion changes ?Psych: anxiety and depression ? ?Objective  ? ?PE ?Blood pressure Marland Kitchen)  112/53, pulse 77, temperature 98.4 ?F (36.9 ?C), resp. rate 12, height '5\' 10"'$  (1.778 m), weight 72.6 kg, SpO2 93 %. ?Constitutional: NAD; conversant; no deformities ?Eyes: Moist conjunctiva; no lid lag; anicteric; PERRL ?Neck: Trachea midline; no thyromegaly ?Lungs: Normal respiratory effort; no tactile fremitus ?CV: RRR; no palpable thrills; no pitting edema ?GI: Abd Severe tenderness throughout with guarding. ?MSK: Normal range of motion of extremities; no clubbing/cyanosis ?Psychiatric: Appropriate affect; alert and oriented x3 ?Lymphatic: No palpable  cervical or axillary lymphadenopathy ? ?Results for orders placed or performed during the hospital encounter of 02/05/22 (from the past 24 hour(s))  ?Lipase, blood     Status: Abnormal  ? Collection Time: 02/05/22  7:57 PM  ?Result Value Ref Range  ? Lipase <10 (L) 11 - 51 U/L  ?Comprehensive metabolic panel     Status: Abnormal  ? Collection Time: 02/05/22  7:57 PM  ?Result Value Ref Range  ? Sodium 128 (L) 135 - 145 mmol/L  ? Potassium 4.0 3.5 - 5.1 mmol/L  ? Chloride 91 (L) 98 - 111 mmol/L  ? CO2 25 22 - 32 mmol/L  ? Glucose, Bld 145 (H) 70 - 99 mg/dL  ? BUN 32 (H) 8 - 23 mg/dL  ? Creatinine, Ser 2.63 (H) 0.44 - 1.00 mg/dL  ? Calcium 9.8 8.9 - 10.3 mg/dL  ? Total Protein 7.0 6.5 - 8.1 g/dL  ? Albumin 4.2 3.5 - 5.0 g/dL  ? AST 16 15 - 41 U/L  ? ALT 17 0 - 44 U/L  ? Alkaline Phosphatase 63 38 - 126 U/L  ? Total Bilirubin 2.3 (H) 0.3 - 1.2 mg/dL  ? GFR, Estimated 19 (L) >60 mL/min  ? Anion gap 12 5 - 15  ?CBC     Status: Abnormal  ? Collection Time: 02/05/22  7:57 PM  ?Result Value Ref Range  ? WBC 20.1 (H) 4.0 - 10.5 K/uL  ? RBC 3.66 (L) 3.87 - 5.11 MIL/uL  ? Hemoglobin 11.7 (L) 12.0 - 15.0 g/dL  ? HCT 34.4 (L) 36.0 - 46.0 %  ? MCV 94.0 80.0 - 100.0 fL  ? MCH 32.0 26.0 - 34.0 pg  ? MCHC 34.0 30.0 - 36.0 g/dL  ? RDW 13.2 11.5 - 15.5 %  ? Platelets 190 150 - 400 K/uL  ? nRBC 0.0 0.0 - 0.2 %  ?Urinalysis, Routine w reflex microscopic Urine, Clean Catch     Status: Abnormal  ? Collection Time: 02/05/22  7:57 PM  ?Result Value Ref Range  ? Color, Urine YELLOW YELLOW  ? APPearance CLOUDY (A) CLEAR  ? Specific Gravity, Urine 1.019 1.005 - 1.030  ? pH 5.0 5.0 - 8.0  ? Glucose, UA NEGATIVE NEGATIVE mg/dL  ? Hgb urine dipstick TRACE (A) NEGATIVE  ? Bilirubin Urine NEGATIVE NEGATIVE  ? Ketones, ur NEGATIVE NEGATIVE mg/dL  ? Protein, ur 100 (A) NEGATIVE mg/dL  ? Nitrite NEGATIVE NEGATIVE  ? Leukocytes,Ua LARGE (A) NEGATIVE  ? RBC / HPF 0-5 0 - 5 RBC/hpf  ? WBC, UA 21-50 0 - 5 WBC/hpf  ? Bacteria, UA FEW (A) NONE SEEN  ?  Squamous Epithelial / LPF 21-50 0 - 5  ? Trans Epithel, UA 3   ? Mucus PRESENT   ? ? ? ?Imaging Orders    ?     CT ABDOMEN PELVIS WO CONTRAST    ?1. Small amount of free air throughout the abdomen and pelvis ?compatible with bowel perforation. Exact site of perforation is ?unknown, but there  is free air clustered in the right lower quadrant ?surrounding ileal loops.  ?2. Findings suspicious for early or partial distal small-bowel ?obstruction. Definitive transition point visualized. ?3. Sigmoid colon diverticulosis. Questionable mild sigmoid colon ?diverticulitis. ?4.  Small amount of free fluid in the pelvis. ?5.  Fibroid uterus. ?6.Aortic Atherosclerosis (ICD10-I70.0). ? ?Colonoscopy 09/07/21 with Dr. Bryan Lemma ?- Hemorrhoids found on perianal exam. ?- Three 3 to 5 mm polyps in the descending colon, in the transverse colon and in the cecum, removed with a cold snare. Resected and retrieved. ?- Diverticulosis in the sigmoid colon. ?- Tortuous colon. ?- Non-bleeding internal hemorrhoids. ?- The examined portion of the ileum was normal. 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 ?Assessment and Plan  ? ?Caitlin Bowers is an 71 y.o. female with abdominal pain found to have peritoneal signs on exam and pneumoperitoneum on CT.  I recommended exploratory laparotomy.  The procedure itself as well as its risks, benefits and alternatives were discussed with the patient.  We discussed the exploratory nature of the procedure and not being sure exactly what may need to be done to fix her problem.  We discussed possible need for colostomy.  The patient granted consent to proceed.  We will proceed to the operating room emergently. ? ?[name], MD ? ?[facility] ?Use [email or web address] to contact on call provider ? ?New Patient Billing: ?[phone] - High MDM ?

## 2022-02-05 NOTE — ED Notes (Addendum)
Report given to [facility] for pre-op // questions and concerns answered ?

## 2022-02-05 NOTE — ED Triage Notes (Signed)
Pt c/o severe abd pain x 1 day with n/v/d/chills; unknown fevers  ?

## 2022-02-06 ENCOUNTER — Other Ambulatory Visit: Payer: Self-pay

## 2022-02-06 ENCOUNTER — Emergency Department (HOSPITAL_COMMUNITY): Payer: Medicare Other | Admitting: Anesthesiology

## 2022-02-06 ENCOUNTER — Encounter (HOSPITAL_COMMUNITY): Payer: Self-pay | Admitting: Surgery

## 2022-02-06 DIAGNOSIS — D259 Leiomyoma of uterus, unspecified: Secondary | ICD-10-CM | POA: Diagnosis present

## 2022-02-06 DIAGNOSIS — K668 Other specified disorders of peritoneum: Secondary | ICD-10-CM | POA: Diagnosis not present

## 2022-02-06 DIAGNOSIS — N739 Female pelvic inflammatory disease, unspecified: Secondary | ICD-10-CM

## 2022-02-06 DIAGNOSIS — Z9889 Other specified postprocedural states: Secondary | ICD-10-CM

## 2022-02-06 DIAGNOSIS — N390 Urinary tract infection, site not specified: Secondary | ICD-10-CM | POA: Diagnosis present

## 2022-02-06 DIAGNOSIS — I10 Essential (primary) hypertension: Secondary | ICD-10-CM

## 2022-02-06 DIAGNOSIS — N179 Acute kidney failure, unspecified: Secondary | ICD-10-CM | POA: Diagnosis present

## 2022-02-06 DIAGNOSIS — Z880 Allergy status to penicillin: Secondary | ICD-10-CM | POA: Diagnosis not present

## 2022-02-06 DIAGNOSIS — E785 Hyperlipidemia, unspecified: Secondary | ICD-10-CM | POA: Diagnosis present

## 2022-02-06 DIAGNOSIS — Z9842 Cataract extraction status, left eye: Secondary | ICD-10-CM | POA: Diagnosis not present

## 2022-02-06 DIAGNOSIS — E871 Hypo-osmolality and hyponatremia: Secondary | ICD-10-CM | POA: Diagnosis not present

## 2022-02-06 DIAGNOSIS — K567 Ileus, unspecified: Secondary | ICD-10-CM | POA: Diagnosis not present

## 2022-02-06 DIAGNOSIS — Z888 Allergy status to other drugs, medicaments and biological substances status: Secondary | ICD-10-CM | POA: Diagnosis not present

## 2022-02-06 DIAGNOSIS — K429 Umbilical hernia without obstruction or gangrene: Secondary | ICD-10-CM | POA: Diagnosis present

## 2022-02-06 DIAGNOSIS — K658 Other peritonitis: Secondary | ICD-10-CM | POA: Diagnosis present

## 2022-02-06 DIAGNOSIS — Z20822 Contact with and (suspected) exposure to covid-19: Secondary | ICD-10-CM | POA: Diagnosis present

## 2022-02-06 DIAGNOSIS — K9189 Other postprocedural complications and disorders of digestive system: Secondary | ICD-10-CM | POA: Diagnosis not present

## 2022-02-06 DIAGNOSIS — Z79899 Other long term (current) drug therapy: Secondary | ICD-10-CM | POA: Diagnosis not present

## 2022-02-06 DIAGNOSIS — E0500 Thyrotoxicosis with diffuse goiter without thyrotoxic crisis or storm: Secondary | ICD-10-CM | POA: Diagnosis present

## 2022-02-06 DIAGNOSIS — Z9841 Cataract extraction status, right eye: Secondary | ICD-10-CM | POA: Diagnosis not present

## 2022-02-06 DIAGNOSIS — K631 Perforation of intestine (nontraumatic): Secondary | ICD-10-CM | POA: Diagnosis present

## 2022-02-06 DIAGNOSIS — B954 Other streptococcus as the cause of diseases classified elsewhere: Secondary | ICD-10-CM | POA: Diagnosis present

## 2022-02-06 DIAGNOSIS — Z882 Allergy status to sulfonamides status: Secondary | ICD-10-CM | POA: Diagnosis not present

## 2022-02-06 LAB — BASIC METABOLIC PANEL
Anion gap: 9 (ref 5–15)
BUN: 34 mg/dL — ABNORMAL HIGH (ref 8–23)
CO2: 21 mmol/L — ABNORMAL LOW (ref 22–32)
Calcium: 8 mg/dL — ABNORMAL LOW (ref 8.9–10.3)
Chloride: 98 mmol/L (ref 98–111)
Creatinine, Ser: 2.7 mg/dL — ABNORMAL HIGH (ref 0.44–1.00)
GFR, Estimated: 18 mL/min — ABNORMAL LOW (ref >60)
Glucose, Bld: 159 mg/dL — ABNORMAL HIGH (ref 70–99)
Potassium: 4.5 mmol/L (ref 3.5–5.1)
Sodium: 128 mmol/L — ABNORMAL LOW (ref 135–145)

## 2022-02-06 LAB — CBC
HCT: 34.3 % — ABNORMAL LOW (ref 36.0–46.0)
Hemoglobin: 11.6 g/dL — ABNORMAL LOW (ref 12.0–15.0)
MCH: 32.1 pg (ref 26.0–34.0)
MCHC: 33.8 g/dL (ref 30.0–36.0)
MCV: 95 fL (ref 80.0–100.0)
Platelets: 174 10*3/uL (ref 150–400)
RBC: 3.61 MIL/uL — ABNORMAL LOW (ref 3.87–5.11)
RDW: 13.2 % (ref 11.5–15.5)
WBC: 15 10*3/uL — ABNORMAL HIGH (ref 4.0–10.5)
nRBC: 0 % (ref 0.0–0.2)

## 2022-02-06 LAB — LACTIC ACID, PLASMA: Lactic Acid, Venous: 1.3 mmol/L (ref 0.5–1.9)

## 2022-02-06 LAB — CREATININE, SERUM
Creatinine, Ser: 2.72 mg/dL — ABNORMAL HIGH (ref 0.44–1.00)
GFR, Estimated: 18 mL/min — ABNORMAL LOW (ref >60)

## 2022-02-06 MED ORDER — PROPOFOL 10 MG/ML IV BOLUS
INTRAVENOUS | Status: AC
  Filled 2022-02-06: qty 20

## 2022-02-06 MED ORDER — ROCURONIUM BROMIDE 10 MG/ML (PF) SYRINGE
PREFILLED_SYRINGE | INTRAVENOUS | Status: DC | PRN
  Administered 2022-02-06: 20 mg via INTRAVENOUS
  Administered 2022-02-06: 50 mg via INTRAVENOUS

## 2022-02-06 MED ORDER — PANTOPRAZOLE SODIUM 40 MG IV SOLR
40.0000 mg | Freq: Every day | INTRAVENOUS | Status: DC
  Administered 2022-02-06 – 2022-02-12 (×7): 40 mg via INTRAVENOUS
  Filled 2022-02-06 (×7): qty 10

## 2022-02-06 MED ORDER — ACETAMINOPHEN 10 MG/ML IV SOLN
1000.0000 mg | Freq: Four times a day (QID) | INTRAVENOUS | Status: DC
  Administered 2022-02-06: 1000 mg via INTRAVENOUS

## 2022-02-06 MED ORDER — LACTATED RINGERS IV SOLN: INTRAVENOUS | Status: DC | PRN

## 2022-02-06 MED ORDER — HYDROMORPHONE HCL 1 MG/ML IJ SOLN
INTRAMUSCULAR | Status: AC
  Filled 2022-02-06: qty 1

## 2022-02-06 MED ORDER — SIMETHICONE 80 MG PO CHEW
80.0000 mg | CHEWABLE_TABLET | Freq: Four times a day (QID) | ORAL | Status: DC | PRN
  Administered 2022-02-06 – 2022-02-07 (×2): 80 mg via ORAL
  Filled 2022-02-06 (×2): qty 1

## 2022-02-06 MED ORDER — METHOCARBAMOL 1000 MG/10ML IJ SOLN
500.0000 mg | Freq: Three times a day (TID) | INTRAVENOUS | Status: DC | PRN
  Administered 2022-02-06 – 2022-02-08 (×2): 500 mg via INTRAVENOUS
  Filled 2022-02-06 (×2): qty 5
  Filled 2022-02-06: qty 500

## 2022-02-06 MED ORDER — ACETAMINOPHEN 10 MG/ML IV SOLN
INTRAVENOUS | Status: AC
  Filled 2022-02-06: qty 100

## 2022-02-06 MED ORDER — MIDAZOLAM HCL 2 MG/2ML IJ SOLN
INTRAMUSCULAR | Status: AC
  Filled 2022-02-06: qty 2

## 2022-02-06 MED ORDER — SODIUM CHLORIDE 0.9 % IV SOLN: INTRAVENOUS | Status: DC

## 2022-02-06 MED ORDER — SUGAMMADEX SODIUM 200 MG/2ML IV SOLN
INTRAVENOUS | Status: DC | PRN
Start: 2022-02-06 — End: 2022-02-06
  Administered 2022-02-06 (×2): 100 mg via INTRAVENOUS

## 2022-02-06 MED ORDER — PROCHLORPERAZINE EDISYLATE 10 MG/2ML IJ SOLN: 5.0000 mg | Freq: Once | INTRAMUSCULAR | Status: DC | PRN

## 2022-02-06 MED ORDER — DEXAMETHASONE SODIUM PHOSPHATE 10 MG/ML IJ SOLN
INTRAMUSCULAR | Status: DC | PRN
  Administered 2022-02-06: 5 mg via INTRAVENOUS

## 2022-02-06 MED ORDER — SUCCINYLCHOLINE CHLORIDE 200 MG/10ML IV SOSY
PREFILLED_SYRINGE | INTRAVENOUS | Status: AC
  Filled 2022-02-06: qty 10

## 2022-02-06 MED ORDER — DEXAMETHASONE SODIUM PHOSPHATE 10 MG/ML IJ SOLN
INTRAMUSCULAR | Status: AC
  Filled 2022-02-06: qty 1

## 2022-02-06 MED ORDER — HYDROMORPHONE HCL 1 MG/ML IJ SOLN
0.5000 mg | INTRAMUSCULAR | Status: DC | PRN
  Administered 2022-02-07: 0.5 mg via INTRAVENOUS
  Filled 2022-02-06: qty 0.5

## 2022-02-06 MED ORDER — ATENOLOL 25 MG PO TABS
25.0000 mg | ORAL_TABLET | Freq: Every day | ORAL | Status: DC
  Administered 2022-02-06 – 2022-02-07 (×2): 25 mg via ORAL
  Filled 2022-02-06 (×2): qty 1

## 2022-02-06 MED ORDER — MIRABEGRON ER 50 MG PO TB24
50.0000 mg | ORAL_TABLET | Freq: Every day | ORAL | Status: DC
  Administered 2022-02-06 – 2022-02-07 (×2): 50 mg via ORAL
  Filled 2022-02-06 (×3): qty 1

## 2022-02-06 MED ORDER — PROCHLORPERAZINE EDISYLATE 10 MG/2ML IJ SOLN
10.0000 mg | INTRAMUSCULAR | Status: DC | PRN
  Administered 2022-02-07 (×2): 10 mg via INTRAVENOUS
  Filled 2022-02-06 (×2): qty 2

## 2022-02-06 MED ORDER — DOCUSATE SODIUM 100 MG PO CAPS
100.0000 mg | ORAL_CAPSULE | Freq: Two times a day (BID) | ORAL | Status: DC
Start: 2022-02-06 — End: 2022-02-08
  Administered 2022-02-06 – 2022-02-07 (×2): 100 mg via ORAL
  Filled 2022-02-06 (×3): qty 1

## 2022-02-06 MED ORDER — FENTANYL CITRATE (PF) 250 MCG/5ML IJ SOLN
INTRAMUSCULAR | Status: DC | PRN
Start: 2022-02-06 — End: 2022-02-06
  Administered 2022-02-06 (×3): 50 ug via INTRAVENOUS

## 2022-02-06 MED ORDER — SUCCINYLCHOLINE CHLORIDE 200 MG/10ML IV SOSY
PREFILLED_SYRINGE | INTRAVENOUS | Status: DC | PRN
  Administered 2022-02-06: 100 mg via INTRAVENOUS

## 2022-02-06 MED ORDER — ROCURONIUM BROMIDE 10 MG/ML (PF) SYRINGE
PREFILLED_SYRINGE | INTRAVENOUS | Status: AC
  Filled 2022-02-06: qty 10

## 2022-02-06 MED ORDER — PHENYLEPHRINE HCL-NACL 20-0.9 MG/250ML-% IV SOLN
INTRAVENOUS | Status: DC | PRN
  Administered 2022-02-06: 20 ug/min via INTRAVENOUS

## 2022-02-06 MED ORDER — ENOXAPARIN SODIUM 30 MG/0.3ML IJ SOSY
30.0000 mg | PREFILLED_SYRINGE | INTRAMUSCULAR | Status: DC
  Administered 2022-02-06 – 2022-02-07 (×2): 30 mg via SUBCUTANEOUS
  Filled 2022-02-06 (×2): qty 0.3

## 2022-02-06 MED ORDER — HEPARIN SODIUM (PORCINE) 5000 UNIT/ML IJ SOLN
5000.0000 [IU] | Freq: Three times a day (TID) | INTRAMUSCULAR | Status: DC
  Administered 2022-02-06: 5000 [IU] via SUBCUTANEOUS
  Filled 2022-02-06 (×2): qty 1

## 2022-02-06 MED ORDER — FLUTICASONE PROPIONATE 50 MCG/ACT NA SUSP
1.0000 | Freq: Every day | NASAL | Status: DC
  Administered 2022-02-07 – 2022-02-13 (×6): 1 via NASAL
  Filled 2022-02-06: qty 16

## 2022-02-06 MED ORDER — PROPOFOL 10 MG/ML IV BOLUS
INTRAVENOUS | Status: DC | PRN
  Administered 2022-02-06: 110 mg via INTRAVENOUS

## 2022-02-06 MED ORDER — ATORVASTATIN CALCIUM 40 MG PO TABS
40.0000 mg | ORAL_TABLET | Freq: Every day | ORAL | Status: DC
  Administered 2022-02-06 – 2022-02-07 (×2): 40 mg via ORAL
  Filled 2022-02-06 (×2): qty 1

## 2022-02-06 MED ORDER — 0.9 % SODIUM CHLORIDE (POUR BTL) OPTIME
TOPICAL | Status: DC | PRN
  Administered 2022-02-06 (×2): 1000 mL

## 2022-02-06 MED ORDER — FENTANYL CITRATE (PF) 250 MCG/5ML IJ SOLN
INTRAMUSCULAR | Status: AC
  Filled 2022-02-06: qty 5

## 2022-02-06 MED ORDER — ONDANSETRON HCL 4 MG/2ML IJ SOLN
INTRAMUSCULAR | Status: AC
  Filled 2022-02-06: qty 2

## 2022-02-06 MED ORDER — CHLORHEXIDINE GLUCONATE CLOTH 2 % EX PADS
6.0000 | MEDICATED_PAD | Freq: Every day | CUTANEOUS | Status: DC
  Administered 2022-02-07: 6 via TOPICAL

## 2022-02-06 MED ORDER — PHENYLEPHRINE 40 MCG/ML (10ML) SYRINGE FOR IV PUSH (FOR BLOOD PRESSURE SUPPORT)
PREFILLED_SYRINGE | INTRAVENOUS | Status: AC
  Filled 2022-02-06: qty 10

## 2022-02-06 MED ORDER — ACETAMINOPHEN 325 MG PO TABS: 650.0000 mg | ORAL_TABLET | Freq: Four times a day (QID) | ORAL | Status: DC

## 2022-02-06 MED ORDER — FENTANYL CITRATE (PF) 100 MCG/2ML IJ SOLN
25.0000 ug | INTRAMUSCULAR | Status: DC | PRN
  Administered 2022-02-06 (×2): 50 ug via INTRAVENOUS

## 2022-02-06 MED ORDER — LIDOCAINE 2% (20 MG/ML) 5 ML SYRINGE
INTRAMUSCULAR | Status: DC | PRN
  Administered 2022-02-06: 60 mg via INTRAVENOUS

## 2022-02-06 MED ORDER — LIDOCAINE 2% (20 MG/ML) 5 ML SYRINGE
INTRAMUSCULAR | Status: AC
  Filled 2022-02-06: qty 5

## 2022-02-06 MED ORDER — ACETAMINOPHEN 10 MG/ML IV SOLN
1000.0000 mg | Freq: Four times a day (QID) | INTRAVENOUS | Status: AC
  Administered 2022-02-06 – 2022-02-07 (×4): 1000 mg via INTRAVENOUS
  Filled 2022-02-06 (×4): qty 100

## 2022-02-06 MED ORDER — HYDROMORPHONE HCL 1 MG/ML IJ SOLN
0.5000 mg | INTRAMUSCULAR | Status: AC | PRN
  Administered 2022-02-06 (×2): 0.5 mg via INTRAVENOUS

## 2022-02-06 MED ORDER — ONDANSETRON HCL 4 MG/2ML IJ SOLN
4.0000 mg | Freq: Four times a day (QID) | INTRAMUSCULAR | Status: DC | PRN
  Administered 2022-02-06 – 2022-02-08 (×3): 4 mg via INTRAVENOUS
  Filled 2022-02-06 (×3): qty 2

## 2022-02-06 MED ORDER — OXYCODONE HCL 5 MG PO TABS: 5.0000 mg | ORAL_TABLET | ORAL | Status: DC | PRN

## 2022-02-06 MED ORDER — OXYCODONE HCL 5 MG PO TABS
10.0000 mg | ORAL_TABLET | ORAL | Status: DC | PRN
  Filled 2022-02-06: qty 2

## 2022-02-06 MED ORDER — METHOCARBAMOL 1000 MG/10ML IJ SOLN: 500.0000 mg | Freq: Four times a day (QID) | INTRAVENOUS | Status: DC | PRN

## 2022-02-06 MED ORDER — MIDAZOLAM HCL 2 MG/2ML IJ SOLN
INTRAMUSCULAR | Status: DC | PRN
  Administered 2022-02-06: 1 mg via INTRAVENOUS

## 2022-02-06 MED ORDER — ONDANSETRON HCL 4 MG/2ML IJ SOLN
INTRAMUSCULAR | Status: DC | PRN
  Administered 2022-02-06: 4 mg via INTRAVENOUS

## 2022-02-06 MED ORDER — PHENYLEPHRINE 40 MCG/ML (10ML) SYRINGE FOR IV PUSH (FOR BLOOD PRESSURE SUPPORT)
PREFILLED_SYRINGE | INTRAVENOUS | Status: DC | PRN
Start: 2022-02-06 — End: 2022-02-06
  Administered 2022-02-06 (×2): 80 ug via INTRAVENOUS

## 2022-02-06 MED ORDER — FENTANYL CITRATE (PF) 100 MCG/2ML IJ SOLN
INTRAMUSCULAR | Status: AC
  Filled 2022-02-06: qty 2

## 2022-02-06 NOTE — Anesthesia Procedure Notes (Signed)
Procedure Name: Intubation ?Date/Time: 02/06/2022 12:38 AM ?Performed by: Trinna Post., CRNA ?Pre-anesthesia Checklist: Patient identified, Emergency Drugs available, Patient being monitored, Timeout performed and Suction available ?Patient Re-evaluated:Patient Re-evaluated prior to induction ?Oxygen Delivery Method: Circle system utilized ?Preoxygenation: Pre-oxygenation with 100% oxygen ?Induction Type: IV induction, Rapid sequence and Cricoid Pressure applied ?Laryngoscope Size: Mac and 3 ?Grade View: Grade I ?Tube type: Oral ?Tube size: 7.0 mm ?Number of attempts: 1 ?Airway Equipment and Method: Stylet ?Placement Confirmation: ETT inserted through vocal cords under direct vision, positive ETCO2 and breath sounds checked- equal and bilateral ?Secured at: 23 cm ?Tube secured with: Tape ?Dental Injury: Teeth and Oropharynx as per pre-operative assessment  ? ? ? ? ?

## 2022-02-06 NOTE — Op Note (Signed)
? ?Patient: Caitlin Bowers (04/18/1951, [id number]) ? ?Date of Surgery: 02/06/2022  ? ?Preoperative Diagnosis: Pneumoperitoneum  ? ?Postoperative Diagnosis: Pelvic abscess ? ?Surgical Procedure:  ?EXPLORATORY LAPAROTOMY  ?PROCTOSCOPY  ?APPENDECTOMY ? ? ?Operative Team Members:  ?Surgeon(s) and Role: ?   * Sonoma Firkus, Nickola Major, MD - Primary ?   Chancy Milroy, MD - GYN intraoperative consultation ? ?Anesthesiologist: Suzette Battiest, MD ?CRNA: Trinna Post., CRNA  ? ?Anesthesia: General  ? ?Fluids:  ?Total I/O ?In: 1300 [I.V.:1300] ?Out: 200 [Urine:100; Blood:100] ? ?Complications: None ? ?Drains:  (19 Fr) Jackson-Pratt drain(s) with closed bulb suction in the right abdomen, sitting in the pelvis   ? ?Specimen:  ?ID Type Source Tests Collected by Time Destination  ?1 : APPENDIX GI Appendix SURGICAL PATHOLOGY Leobardo Granlund, Nickola Major, MD 02/06/2022 0150   ?  ? ?Disposition:  PACU - hemodynamically stable. ? ?Plan of Care: Admit to inpatient  ? ? ? ?Indications for Procedure: Caitlin Bowers is a 71 y.o. female who presented with peritonitis and pneumoperitoneum on CT.  I recommended exploratory laparotomy. ? ?The procedure itself as well as its risks, benefits and alternatives were discussed.  The risks discussed included but were not limited to the risk of infection, bleeding, damage to nearby structures, and need for colostomy.  After a full discussion and all questions answered the patient granted consent to proceed. ? ?Findings: Pelvic abscess with reactive changes of the nearby appendix, sigmoid colon, small bowel and adnexal structures.  No clear cause of pneumoperitoneum or the abscess.  Fibroid uterus. ? ? ?Description of Procedure:  ? ?On the date stated above the patient was taken to the operating room suite and placed in supine position.  General endotracheal anesthesia was induced.  A timeout was completed verifying the correct patient, procedure, position, and equipment needed for the case.   The patient's abdomen was prepped and draped in usual sterile fashion. ? ?I made a midline laparotomy incision and entered the abdomen without any trauma the underlying viscera.  The small umbilical hernia defect contained preperitoneal fat was incorporated into the midline laparotomy incision. ? ?The abdomen was systematically explored.  The small bowel was run from the ligament of Treitz to the terminal ileum.  There were a 2 areas of mild patchy ischemic changes on the antimesenteric border of the mid to distal jejunum, however this did not change in appearance throughout the case and the bowel in this area appeared viable.  There was a small area of the ileum that had reactive inflammatory changes from being down in the pelvis near the pelvic abscess.  The colon was inspected from the cecum to the rectum.  The colon was dilated and elongated.  There were reactive inflammatory changes of the rectosigmoid colon where it was next to the pelvic abscess.  However in general the rectosigmoid colon appeared healthy.  The adnexal structures were inspected.  The ovaries appeared appropriate for a 71 year old woman.  The fallopian tubes appeared normal without any signs of tubo-ovarian abscess or other cause of this pelvic abscess.  The uterus had large fibroids.  The pelvic abscess was drained and the pelvis was irrigated and suctioned clean.  There was no ongoing purulence spilling into this area.  The appendix was inspected.  It was small and long and had some reactive inflammatory changes.  It did not appear to be the cause of the abscess.  Due to the location of the abscess and the proximity of the appendix and  no clear cause of this infection, decision was made to remove the appendix.  The mesoappendix was removed and the appendiceal artery was ligated using a Vicryl suture.  The base the appendix was ligated with a Vicryl suture.  The appendix was divided and passed off the field as a specimen.  The stump of the  appendix was imbricated into the cecum with two 2-0 Vicryl sutures.  A proctoscope was placed in the rectum and the rectosigmoid colon was inflated with air while submerged under irrigation.  There were no bubbles suggesting a negative leak test.  With no clear cause of this abscess a 13 [place] JP drain was brought in through the right abdominal wall, positioned in the right paracolic gutter terminating in the pelvis at the location of this abscess.  I inspected the stomach and first and second portion of the duodenum..  The stomach was dilated.  There was no evidence of peptic ulcer disease in the stomach or duodenum.  There was no purulence in the upper abdomen.  A NG tube was positioned in the stomach and the stomach was decompressed with normal gastric contents and air returning.  The lesser sac was opened and there was no purulence in the lesser sac.  No abnormalities noted to the posterior wall of the stomach. ? ?The abdomen was irrigated with multiple liters of fluid and we directed our attention to closure.  The fascia of the incision was closed using running 2-0 PDS suture.  1/4 inch Penrose drain was placed in the subcutaneous space.  2-0 Vicryl suture were used to close the deep dermal tissues.  4-0 Monocryl and Dermabond were used to close the skin.  The Penrose drain was secured in position with a nylon suture at the inferior aspect of the incision.  The JP drain was secured in position with a 2-0 nylon suture.  A sterile dressing was applied.  All sponge and needle counts were correct at the end of this case. ? ?At the end of the case we reviewed the infection status of the case. ?Patient: Caitlin Bowers Emergency General Surgery Service Patient ?Case: Emergent ?Infection Present At Time Of Surgery (PATOS): Abscess location: pelvic ? ?Louanna Raw, MD ?General, Bariatric, & Minimally Invasive Surgery ?[facility] [place] ? ?

## 2022-02-06 NOTE — Consult Note (Signed)
[facility] OB/GYN Attending Consult Note   Consult Date: 02/06/2022  Reason for Consult: Free air in abdomen Referring Physician: Louanna Raw    Assessment/Plan: Uterine fibroids Post menopausal appearing ovaries No definite GYN source of intra abdomen inflammation process identified.   Appreciate care of Caitlin Bowers by her primary team. We will schedule Gyn follow up appt for the patient. Thank you for involving [place] in the care of this patient.    Arlina Robes, MD, [place] Attending [place], [place] Phone: [phone]   History of Present Illness: Caitlin Bowers is an 71 y.o. No obstetric history on file. female who was admitted for free air in the abdomen. She was taken to the OR for further evaluation by Dr Louanna Raw.  I was asked to see pt in the operating room due to the finding of a lower pelvic collection of pus.     Pertinent OB/GYN History: No LMP recorded. Patient is postmenopausal.  Patient Active Problem List   Diagnosis Date Noted   Status post surgery 02/06/2022   Urinary urgency 11/05/2019   Hyperlipidemia 11/05/2019   Hypertension 11/05/2019   History of Graves' disease 11/05/2019    Past Medical History:  Diagnosis Date   Arthritis    RIGHT shoulder, bilateral hands   Cataract    bilateral sx   Graves disease    in remission as of 08/24/2021   Hyperlipidemia    on meds   Hypertension    on meds   Seasonal allergies    Urgency of urination     Past Surgical History:  Procedure Laterality Date   CATARACT EXTRACTION, BILATERAL Bilateral 02/2020   CESAREAN SECTION  1979   COLONOSCOPY  2017   UTSW, tics, 2 polyps, TA x 1, 5 yr recall   POLYPECTOMY  2017   TA x 1   TONSILLECTOMY  1957    Family History  Adopted: Yes  Family history unknown: Yes    Social History:  reports that she has never smoked. She  has never used smokeless tobacco. She reports current alcohol use of about 7.0 standard drinks per week. She reports that she does not use drugs.  Allergies:  Allergies  Allergen Reactions   Oxybutynin     Dry mouth; no taste; not effective for incontinence   Penicillins Hives   Sulfa Antibiotics Hives    Medications: I have reviewed the patient's current medications.  Review of Systems: Pertinent items are noted in HPI.  Intraoperative Exam.  Uterine fibroids noted, Uterus mobile. No evidence of pyometrium. Adnexal normal appearing for a postmenopausal female. No definite Gyn source of infection/inflammation noted.  Remainder of the procedure as per Dr Louanna Raw.   Results for orders placed or performed during the hospital encounter of 02/05/22 (from the past 72 hour(s))  Lipase, blood     Status: Abnormal   Collection Time: 02/05/22  7:57 PM  Result Value Ref Range   Lipase <10 (L) 11 - 51 U/L    Comment: Performed at [facility], [address] [place], [place] [address]  Comprehensive metabolic panel     Status: Abnormal   Collection Time: 02/05/22  7:57 PM  Result Value Ref Range   Sodium 128 (L) 135 - 145 mmol/L   Potassium 4.0 3.5 - 5.1 mmol/L   Chloride 91 (L) 98 - 111 mmol/L  CO2 25 22 - 32 mmol/L   Glucose, Bld 145 (H) 70 - 99 mg/dL    Comment: Glucose reference range applies only to samples taken after fasting for at least 8 hours.   BUN 32 (H) 8 - 23 mg/dL   Creatinine, Ser 2.63 (H) 0.44 - 1.00 mg/dL   Calcium 9.8 8.9 - 10.3 mg/dL   Total Protein 7.0 6.5 - 8.1 g/dL   Albumin 4.2 3.5 - 5.0 g/dL   AST 16 15 - 41 U/L   ALT 17 0 - 44 U/L   Alkaline Phosphatase 63 38 - 126 U/L   Total Bilirubin 2.3 (H) 0.3 - 1.2 mg/dL   GFR, Estimated 19 (L) >60 mL/min    Comment: (NOTE) Calculated using the CKD-EPI Creatinine Equation (2021)    Anion gap 12 5 - 15    Comment: Performed at [facility], [address],  [place], [place] [address]  CBC     Status: Abnormal   Collection Time: 02/05/22  7:57 PM  Result Value Ref Range   WBC 20.1 (H) 4.0 - 10.5 K/uL   RBC 3.66 (L) 3.87 - 5.11 MIL/uL   Hemoglobin 11.7 (L) 12.0 - 15.0 g/dL   HCT 34.4 (L) 36.0 - 46.0 %   MCV 94.0 80.0 - 100.0 fL   MCH 32.0 26.0 - 34.0 pg   MCHC 34.0 30.0 - 36.0 g/dL   RDW 13.2 11.5 - 15.5 %   Platelets 190 150 - 400 K/uL   nRBC 0.0 0.0 - 0.2 %    Comment: Performed at [facility], [address], [place], [place] [address]  Urinalysis, Routine w reflex microscopic Urine, Clean Catch     Status: Abnormal   Collection Time: 02/05/22  7:57 PM  Result Value Ref Range   Color, Urine YELLOW YELLOW   APPearance CLOUDY (A) CLEAR   Specific Gravity, Urine 1.019 1.005 - 1.030   pH 5.0 5.0 - 8.0   Glucose, UA NEGATIVE NEGATIVE mg/dL   Hgb urine dipstick TRACE (A) NEGATIVE   Bilirubin Urine NEGATIVE NEGATIVE   Ketones, ur NEGATIVE NEGATIVE mg/dL   Protein, ur 100 (A) NEGATIVE mg/dL   Nitrite NEGATIVE NEGATIVE   Leukocytes,Ua LARGE (A) NEGATIVE   RBC / HPF 0-5 0 - 5 RBC/hpf   WBC, UA 21-50 0 - 5 WBC/hpf   Bacteria, UA FEW (A) NONE SEEN   Squamous Epithelial / LPF 21-50 0 - 5   Trans Epithel, UA 3    Mucus PRESENT     Comment: Performed at [facility], [address] [place], [place] [address]  Resp Panel by RT-PCR (Flu A&B, Covid) Urine, Clean Catch     Status: None   Collection Time: 02/05/22 10:45 PM   Specimen: Urine, Clean Catch; Nasopharyngeal(NP) swabs in vial transport medium  Result Value Ref Range   SARS Coronavirus 2 by RT PCR NEGATIVE NEGATIVE    Comment: (NOTE) SARS-CoV-2 target nucleic acids are NOT DETECTED.  The SARS-CoV-2 RNA is generally detectable in upper respiratory specimens during the acute phase of infection. The lowest concentration of SARS-CoV-2 viral copies this assay can detect is 138 copies/mL. A negative result does not preclude SARS-Cov-2 infection and  should not be used as the sole basis for treatment or other patient management decisions. A negative result may occur with  improper specimen collection/handling, submission of specimen other than nasopharyngeal swab, presence of viral mutation(s) within the areas targeted by this assay, and inadequate number of  viral copies(<138 copies/mL). A negative result must be combined with clinical observations, patient history, and epidemiological information. The expected result is Negative.  Fact Sheet for Patients:  EntrepreneurPulse.com.au  Fact Sheet for Healthcare Providers:  IncredibleEmployment.be  This test is no t yet approved or cleared by the [place] FDA and  has been authorized for detection and/or diagnosis of SARS-CoV-2 by FDA under an Emergency Use Authorization (EUA). This EUA will remain  in effect (meaning this test can be used) for the duration of the COVID-19 declaration under Section 564(b)(1) of the Act, 21 U.S.C.section 360bbb-3(b)(1), unless the authorization is terminated  or revoked sooner.       Influenza A by PCR NEGATIVE NEGATIVE   Influenza B by PCR NEGATIVE NEGATIVE    Comment: (NOTE) The Xpert Xpress SARS-CoV-2/FLU/RSV plus assay is intended as an aid in the diagnosis of influenza from Nasopharyngeal swab specimens and should not be used as a sole basis for treatment. Nasal washings and aspirates are unacceptable for Xpert Xpress SARS-CoV-2/FLU/RSV testing.  Fact Sheet for Patients: EntrepreneurPulse.com.au  Fact Sheet for Healthcare Providers: IncredibleEmployment.be  This test is not yet approved or cleared by the [place] FDA and has been authorized for detection and/or diagnosis of SARS-CoV-2 by FDA under an Emergency Use Authorization (EUA). This EUA will remain in effect (meaning this test can be used) for the duration of the COVID-19 declaration under Section  564(b)(1) of the Act, 21 U.S.C. section 360bbb-3(b)(1), unless the authorization is terminated or revoked.  Performed at [facility], [address], [place], [place] [address]   Lactic acid, plasma     Status: None   Collection Time: 02/05/22 10:45 PM  Result Value Ref Range   Lactic Acid, Venous 1.5 0.5 - 1.9 mmol/L    Comment: Performed at [facility], [address], [place], [place] [address]     CT ABDOMEN PELVIS WO CONTRAST  Result Date: 02/05/2022 CLINICAL DATA:  Severe abdominal pain with nausea and vomiting. EXAM: CT ABDOMEN AND PELVIS WITHOUT CONTRAST TECHNIQUE: Multidetector CT imaging of the abdomen and pelvis was performed following the standard protocol without IV contrast. RADIATION DOSE REDUCTION: This exam was performed according to the departmental dose-optimization program which includes automated exposure control, adjustment of the mA and/or kV according to patient size and/or use of iterative reconstruction technique. COMPARISON:  CT abdomen and pelvis 12/03/2019 FINDINGS: Lower chest: No acute abnormality. Hepatobiliary: There is fatty infiltration of the liver. Gallbladder and bile ducts are within normal limits. Previously identified hepatic cysts are better seen on contrast enhanced study. Pancreas: Unremarkable. No pancreatic ductal dilatation or surrounding inflammatory changes. Spleen: Normal in size without focal abnormality. Adrenals/Urinary Tract: Adrenal glands are unremarkable. Kidneys are normal, without renal calculi, focal lesion, or hydronephrosis. Bladder is unremarkable. Stomach/Bowel: There is a small hiatal hernia. Stomach is moderately distended with air-fluid levels. Small bowel loops are diffusely distended with air-fluid levels throughout the mid and upper abdomen. These measure less than 3 cm. Definitive transition point visualized, but there are decompressed ileal loops in the right lower quadrant. Colon is  nondilated. Appendix within normal limits. There is a small amount of free air throughout the abdomen and pelvis, slightly clustered in the right lower quadrant. Site of bowel perforation is unclear. There is significant sigmoid colon diverticulosis with questionable mild diverticulitis in the right pelvis. Vascular/Lymphatic: Aortic atherosclerosis. No enlarged abdominal or pelvic lymph nodes. Reproductive: Calcified uterine fibroids are identified measuring up to 4.1 cm. Ovaries are not  well delineated on this study. Other: Small amount of free fluid in the pelvis. Small fat containing umbilical hernia. Musculoskeletal: Degenerative changes affect the spine. IMPRESSION: 1. Small amount of free air throughout the abdomen and pelvis compatible with bowel perforation. Exact site of perforation is unknown, but there is free air clustered in the right lower quadrant surrounding ileal loops. 2. Findings suspicious for early or partial distal small-bowel obstruction. Definitive transition point visualized. 3. Sigmoid colon diverticulosis. Questionable mild sigmoid colon diverticulitis. 4.  Small amount of free fluid in the pelvis. 5.  Fibroid uterus. 6.Aortic Atherosclerosis (ICD10-I70.0). These results were called by telephone at the time of interpretation on 02/05/2022 at 10:26 pm to provider [facility] , who verbally acknowledged these results. Electronically Signed   By: Ronney Asters M.D.   On: 02/05/2022 22:29

## 2022-02-06 NOTE — ED Notes (Signed)
VO for fentanyl 55mg given by Dr. TSherry Ruffing ? ?Pt currently rating abd pain 6/10 ?

## 2022-02-06 NOTE — Progress Notes (Signed)
? ?Progress Note ? ?1 Day Post-Op  ?Subjective: ?Pt reports generalized abdominal pain. She is burping some. No flatus but abdomen does not feel distended.  ? ?Objective: ?Vital signs in last 24 hours: ?Temp:  [97.3 ?F (36.3 ?C)-98.4 ?F (36.9 ?C)] 97.6 ?F (36.4 ?C) (03/06 0830) ?Pulse Rate:  [69-120] 69 (03/06 0830) ?Resp:  [12-21] 18 (03/06 0830) ?BP: (97-119)/(53-70) 105/70 (03/06 0830) ?SpO2:  [93 %-100 %] 97 % (03/06 0830) ?Weight:  [72.6 kg] 72.6 kg (03/05 1956) ?Last BM Date : 02/05/22 (diarrhea per patient) ? ?Intake/Output from previous day: ?03/05 0701 - 03/06 0700 ?In: 1508.3 [I.V.:1508.3] ?Out: 380 [Urine:200; Drains:80; Blood:100] ?Intake/Output this shift: ?No intake/output data recorded. ? ?PE: ?General: pleasant, WD, [name] who is laying in bed in NAD ?Heart: regular, rate, and rhythm.  ?Lungs: CTAB, no wheezes, rhonchi, or rales noted.  Respiratory effort nonlabored ?Abd: soft, appropriately ttp, mild distention, BS hypoactive, NGT with thin bilious drainage minimal amt in canister, incision with staples present and penrose drain inferiorly  ?Psych: A&Ox3 with an appropriate affect.  ? ? ?Lab Results:  ?Recent Labs  ?  02/05/22 ?3009 02/06/22 ?2330  ?WBC 20.1* 15.0*  ?HGB 11.7* 11.6*  ?HCT 34.4* 34.3*  ?PLT 190 174  ? ?BMET ?Recent Labs  ?  02/05/22 ?0762 02/06/22 ?2633  ?NA 128* 128*  ?K 4.0 4.5  ?CL 91* 98  ?CO2 25 21*  ?GLUCOSE 145* 159*  ?BUN 32* 34*  ?CREATININE 2.63* 2.70*  2.72*  ?CALCIUM 9.8 8.0*  ? ?PT/INR ?No results for input(s): LABPROT, INR in the last 72 hours. ?CMP  ?   ?Component Value Date/Time  ? NA 128 (L) 02/06/2022 3545  ? K 4.5 02/06/2022 0634  ? CL 98 02/06/2022 0634  ? CO2 21 (L) 02/06/2022 6256  ? GLUCOSE 159 (H) 02/06/2022 3893  ? BUN 34 (H) 02/06/2022 7342  ? CREATININE 2.72 (H) 02/06/2022 8768  ? CREATININE 2.70 (H) 02/06/2022 1157  ? CALCIUM 8.0 (L) 02/06/2022 2620  ? PROT 7.0 02/05/2022 1957  ? ALBUMIN 4.2 02/05/2022 1957  ? AST 16 02/05/2022 1957  ? ALT 17  02/05/2022 1957  ? ALKPHOS 63 02/05/2022 1957  ? BILITOT 2.3 (H) 02/05/2022 1957  ? GFRNONAA 18 (L) 02/06/2022 3559  ? GFRNONAA 18 (L) 02/06/2022 7416  ? ?Lipase  ?   ?Component Value Date/Time  ? LIPASE <10 (L) 02/05/2022 1957  ? ? ? ? ? ?Studies/Results: ?CT ABDOMEN PELVIS WO CONTRAST ? ?Result Date: 02/05/2022 ?CLINICAL DATA:  Severe abdominal pain with nausea and vomiting. EXAM: CT ABDOMEN AND PELVIS WITHOUT CONTRAST TECHNIQUE: Multidetector CT imaging of the abdomen and pelvis was performed following the standard protocol without IV contrast. RADIATION DOSE REDUCTION: This exam was performed according to the departmental dose-optimization program which includes automated exposure control, adjustment of the mA and/or kV according to patient size and/or use of iterative reconstruction technique. COMPARISON:  CT abdomen and pelvis 12/03/2019 FINDINGS: Lower chest: No acute abnormality. Hepatobiliary: There is fatty infiltration of the liver. Gallbladder and bile ducts are within normal limits. Previously identified hepatic cysts are better seen on contrast enhanced study. Pancreas: Unremarkable. No pancreatic ductal dilatation or surrounding inflammatory changes. Spleen: Normal in size without focal abnormality. Adrenals/Urinary Tract: Adrenal glands are unremarkable. Kidneys are normal, without renal calculi, focal lesion, or hydronephrosis. Bladder is unremarkable. Stomach/Bowel: There is a small hiatal hernia. Stomach is moderately distended with air-fluid levels. Small bowel loops are diffusely distended with air-fluid levels throughout the mid and upper  abdomen. These measure less than 3 cm. Definitive transition point visualized, but there are decompressed ileal loops in the right lower quadrant. Colon is nondilated. Appendix within normal limits. There is a small amount of free air throughout the abdomen and pelvis, slightly clustered in the right lower quadrant. Site of bowel perforation is unclear. There is  significant sigmoid colon diverticulosis with questionable mild diverticulitis in the right pelvis. Vascular/Lymphatic: Aortic atherosclerosis. No enlarged abdominal or pelvic lymph nodes. Reproductive: Calcified uterine fibroids are identified measuring up to 4.1 cm. Ovaries are not well delineated on this study. Other: Small amount of free fluid in the pelvis. Small fat containing umbilical hernia. Musculoskeletal: Degenerative changes affect the spine. IMPRESSION: 1. Small amount of free air throughout the abdomen and pelvis compatible with bowel perforation. Exact site of perforation is unknown, but there is free air clustered in the right lower quadrant surrounding ileal loops. 2. Findings suspicious for early or partial distal small-bowel obstruction. Definitive transition point visualized. 3. Sigmoid colon diverticulosis. Questionable mild sigmoid colon diverticulitis. 4.  Small amount of free fluid in the pelvis. 5.  Fibroid uterus. 6.Aortic Atherosclerosis (ICD10-I70.0). These results were called by telephone at the time of interpretation on 02/05/2022 at 10:26 pm to provider [facility] , who verbally acknowledged these results. Electronically Signed   By: Ronney Asters M.D.   On: 02/05/2022 22:29   ? ?Anti-infectives: ?Anti-infectives (From admission, onward)  ? ? Start     Dose/Rate Route Frequency Ordered Stop  ? 02/06/22 2200  vancomycin (VANCOREADY) IVPB 750 mg/150 mL       ? 750 mg ?150 mL/hr over 60 Minutes Intravenous Every 24 hours 02/05/22 2308    ? 02/05/22 2300  levofloxacin (LEVAQUIN) IVPB 750 mg       ? 750 mg ?100 mL/hr over 90 Minutes Intravenous Every 48 hours 02/05/22 2248    ? 02/05/22 2300  vancomycin (VANCOCIN) IVPB 1000 mg/200 mL premix       ? 1,000 mg ?200 mL/hr over 60 Minutes Intravenous  Once 02/05/22 2249 02/05/22 2357  ? ?  ? ? ? ?Assessment/Plan ?Pneumoperitoneum with pelvic abscess ?POD1 s/p Exploratory laparotomy, proctoscopy, appendectomy 02/06/22 Dr. Thermon Leyland ?-  remove NGT, minimal output  ?- ambulate ?- DC foley  ?- keep on ice chips and sips with meds until return in bowel function  ?- repeat labs this AM ? ?FEN: NPO, ice chips, IVF 100cc/h ?VTE: LMWH ?ID: levaquin and vanc  ? ?HLD ?HTN ?Graves disease ? ? LOS: 0 days  ? ?I reviewed last 24 h vitals and pain scores, last 48 h intake and output, last 24 h labs and trends, and last 24 h imaging results. ? ?This care required moderate level of medical decision making.  ? ? ?Norm Parcel, PA-C ?[facility] ?02/06/2022, 9:42 AM ?Please see Amion for pager number during day hours 7:00am-4:30pm ? ?

## 2022-02-06 NOTE — Transfer of Care (Signed)
Immediate Anesthesia Transfer of Care Note ? ?Patient: Caitlin Bowers ? ?Procedure(s) Performed: EXPLORATORY LAPAROTOMY PROCTOSIGMOIDOSCOPY APPENDECTOMY (Abdomen) ? ?Patient Location: PACU ? ?Anesthesia Type:General ? ?Level of Consciousness: awake, alert  and oriented ? ?Airway & Oxygen Therapy: Patient Spontanous Breathing ? ?Post-op Assessment: Report given to RN and Post -op Vital signs reviewed and stable ? ?Post vital signs: Reviewed and stable ? ?Last Vitals:  ?Vitals Value Taken Time  ?BP 97/63 02/06/22 0256  ?Temp 36.3 ?C 02/06/22 0255  ?Pulse 76 02/06/22 0302  ?Resp 22 02/06/22 0302  ?SpO2 95 % 02/06/22 0302  ?Vitals shown include unvalidated device data. ? ?Last Pain:  ?Vitals:  ? 02/06/22 0255  ?PainSc: 5   ?   ? ?  ? ?Complications: No notable events documented. ?

## 2022-02-06 NOTE — Anesthesia Preprocedure Evaluation (Signed)
Anesthesia Evaluation  ?Patient identified by MRN, date of birth, ID band ?Patient awake ? ? ? ?Reviewed: ?Allergy & Precautions, NPO status , Patient's Chart, lab work & pertinent test results ? ?Airway ?Mallampati: II ? ?TM Distance: >3 FB ?Neck ROM: Full ? ? ? Dental ? ?(+) Dental Advisory Given ?  ?Pulmonary ?neg pulmonary ROS,  ?  ?breath sounds clear to auscultation ? ? ? ? ? ? Cardiovascular ?hypertension, Pt. on home beta blockers and Pt. on medications ? ?Rhythm:Regular Rate:Normal ? ? ?  ?Neuro/Psych ?negative neurological ROS ?   ? GI/Hepatic ?Neg liver ROS, pneumoperitoneum ?  ?Endo/Other  ?negative endocrine ROS ? Renal/GU ?ARFRenal disease  ? ?  ?Musculoskeletal ? ?(+) Arthritis ,  ? Abdominal ?  ?Peds ? Hematology ? ?(+) Blood dyscrasia, anemia ,   ?Anesthesia Other Findings ? ? Reproductive/Obstetrics ? ?  ? ? ? ? ? ? ? ? ? ? ? ? ? ?  ?  ? ? ? ? ? ? ? ? ?Lab Results  ?Component Value Date  ? WBC 20.1 (H) 02/05/2022  ? HGB 11.7 (L) 02/05/2022  ? HCT 34.4 (L) 02/05/2022  ? MCV 94.0 02/05/2022  ? PLT 190 02/05/2022  ? ?Lab Results  ?Component Value Date  ? CREATININE 2.63 (H) 02/05/2022  ? BUN 32 (H) 02/05/2022  ? NA 128 (L) 02/05/2022  ? K 4.0 02/05/2022  ? CL 91 (L) 02/05/2022  ? CO2 25 02/05/2022  ? ? ?Anesthesia Physical ?Anesthesia Plan ? ?ASA: 3 and emergent ? ?Anesthesia Plan: General  ? ?Post-op Pain Management: Ofirmev IV (intra-op)*  ? ?Induction: Intravenous, Rapid sequence and Cricoid pressure planned ? ?PONV Risk Score and Plan: 3 and Dexamethasone, Ondansetron and Treatment may vary due to age or medical condition ? ?Airway Management Planned: Oral ETT ? ?Additional Equipment: None ? ?Intra-op Plan:  ? ?Post-operative Plan: Extubation in OR and Possible Post-op intubation/ventilation ? ?Informed Consent: I have reviewed the patients History and Physical, chart, labs and discussed the procedure including the risks, benefits and alternatives for the proposed  anesthesia with the patient or authorized representative who has indicated his/her understanding and acceptance.  ? ? ? ?Dental advisory given ? ?Plan Discussed with: CRNA ? ?Anesthesia Plan Comments:   ? ? ? ? ? ? ?Anesthesia Quick Evaluation ? ?

## 2022-02-07 ENCOUNTER — Inpatient Hospital Stay (HOSPITAL_COMMUNITY): Payer: Medicare Other

## 2022-02-07 LAB — BASIC METABOLIC PANEL
Anion gap: 10 (ref 5–15)
BUN: 35 mg/dL — ABNORMAL HIGH (ref 8–23)
CO2: 20 mmol/L — ABNORMAL LOW (ref 22–32)
Calcium: 8 mg/dL — ABNORMAL LOW (ref 8.9–10.3)
Chloride: 99 mmol/L (ref 98–111)
Creatinine, Ser: 2.14 mg/dL — ABNORMAL HIGH (ref 0.44–1.00)
GFR, Estimated: 24 mL/min — ABNORMAL LOW (ref >60)
Glucose, Bld: 121 mg/dL — ABNORMAL HIGH (ref 70–99)
Potassium: 4 mmol/L (ref 3.5–5.1)
Sodium: 129 mmol/L — ABNORMAL LOW (ref 135–145)

## 2022-02-07 LAB — CBC
HCT: 29.8 % — ABNORMAL LOW (ref 36.0–46.0)
Hemoglobin: 10 g/dL — ABNORMAL LOW (ref 12.0–15.0)
MCH: 32.1 pg (ref 26.0–34.0)
MCHC: 33.6 g/dL (ref 30.0–36.0)
MCV: 95.5 fL (ref 80.0–100.0)
Platelets: 176 10*3/uL (ref 150–400)
RBC: 3.12 MIL/uL — ABNORMAL LOW (ref 3.87–5.11)
RDW: 13.4 % (ref 11.5–15.5)
WBC: 14.6 10*3/uL — ABNORMAL HIGH (ref 4.0–10.5)
nRBC: 0 % (ref 0.0–0.2)

## 2022-02-07 MED ORDER — ACETAMINOPHEN 10 MG/ML IV SOLN
1000.0000 mg | Freq: Four times a day (QID) | INTRAVENOUS | Status: AC
  Administered 2022-02-07 – 2022-02-08 (×4): 1000 mg via INTRAVENOUS
  Filled 2022-02-07 (×4): qty 100

## 2022-02-07 NOTE — Progress Notes (Signed)
? ?Progress Note ? ?2 Days Post-Op  ?Subjective: ?Patient reports she had a lot of indigestion and gas pain overnight. Feeling better this AM. No flatus but belching. Denies n/v. She does not feel any more bloated. She got up some yesterday and sat on the side of the bed.  ? ?Objective: ?Vital signs in last 24 hours: ?Temp:  [97.5 ?F (36.4 ?C)-99 ?F (37.2 ?C)] 98.1 ?F (36.7 ?C) (03/07 0630) ?Pulse Rate:  [69-79] 77 (03/07 0630) ?Resp:  [17-18] 17 (03/07 0630) ?BP: (105-129)/(58-72) 106/65 (03/07 0630) ?SpO2:  [94 %-99 %] 94 % (03/07 0630) ?Last BM Date : 02/04/22 ? ?Intake/Output from previous day: ?03/06 0701 - 03/07 0700 ?In: 2022.8 [I.V.:1479.1; IV Piggyback:543.7] ?Out: 7829 [Urine:925; Drains:180] ?Intake/Output this shift: ?No intake/output data recorded. ? ?PE: ?General: pleasant, WD, [name] who is laying in bed in NAD ?Heart: regular, rate, and rhythm.  ?Lungs: CTAB, no wheezes, rhonchi, or rales noted.  Respiratory effort nonlabored ?Abd: soft, appropriately ttp, mild distention, BS hypoactive, incision with staples present and penrose drain inferiorly, drain in RLQ with SS fluid ?Psych: A&Ox3 with an appropriate affect.  ? ? ?Lab Results:  ?Recent Labs  ?  02/06/22 ?0634 02/07/22 ?0118  ?WBC 15.0* 14.6*  ?HGB 11.6* 10.0*  ?HCT 34.3* 29.8*  ?PLT 174 176  ? ?BMET ?Recent Labs  ?  02/06/22 ?0634 02/07/22 ?0118  ?NA 128* 129*  ?K 4.5 4.0  ?CL 98 99  ?CO2 21* 20*  ?GLUCOSE 159* 121*  ?BUN 34* 35*  ?CREATININE 2.70*  2.72* 2.14*  ?CALCIUM 8.0* 8.0*  ? ?PT/INR ?No results for input(s): LABPROT, INR in the last 72 hours. ?CMP  ?   ?Component Value Date/Time  ? NA 129 (L) 02/07/2022 0118  ? K 4.0 02/07/2022 0118  ? CL 99 02/07/2022 0118  ? CO2 20 (L) 02/07/2022 0118  ? GLUCOSE 121 (H) 02/07/2022 0118  ? BUN 35 (H) 02/07/2022 0118  ? CREATININE 2.14 (H) 02/07/2022 0118  ? CALCIUM 8.0 (L) 02/07/2022 0118  ? PROT 7.0 02/05/2022 1957  ? ALBUMIN 4.2 02/05/2022 1957  ? AST 16 02/05/2022 1957  ? ALT 17 02/05/2022  1957  ? ALKPHOS 63 02/05/2022 1957  ? BILITOT 2.3 (H) 02/05/2022 1957  ? GFRNONAA 24 (L) 02/07/2022 0118  ? ?Lipase  ?   ?Component Value Date/Time  ? LIPASE <10 (L) 02/05/2022 1957  ? ? ? ? ? ?Studies/Results: ?CT ABDOMEN PELVIS WO CONTRAST ? ?Result Date: 02/05/2022 ?CLINICAL DATA:  Severe abdominal pain with nausea and vomiting. EXAM: CT ABDOMEN AND PELVIS WITHOUT CONTRAST TECHNIQUE: Multidetector CT imaging of the abdomen and pelvis was performed following the standard protocol without IV contrast. RADIATION DOSE REDUCTION: This exam was performed according to the departmental dose-optimization program which includes automated exposure control, adjustment of the mA and/or kV according to patient size and/or use of iterative reconstruction technique. COMPARISON:  CT abdomen and pelvis 12/03/2019 FINDINGS: Lower chest: No acute abnormality. Hepatobiliary: There is fatty infiltration of the liver. Gallbladder and bile ducts are within normal limits. Previously identified hepatic cysts are better seen on contrast enhanced study. Pancreas: Unremarkable. No pancreatic ductal dilatation or surrounding inflammatory changes. Spleen: Normal in size without focal abnormality. Adrenals/Urinary Tract: Adrenal glands are unremarkable. Kidneys are normal, without renal calculi, focal lesion, or hydronephrosis. Bladder is unremarkable. Stomach/Bowel: There is a small hiatal hernia. Stomach is moderately distended with air-fluid levels. Small bowel loops are diffusely distended with air-fluid levels throughout the mid and upper abdomen. These measure  less than 3 cm. Definitive transition point visualized, but there are decompressed ileal loops in the right lower quadrant. Colon is nondilated. Appendix within normal limits. There is a small amount of free air throughout the abdomen and pelvis, slightly clustered in the right lower quadrant. Site of bowel perforation is unclear. There is significant sigmoid colon diverticulosis with  questionable mild diverticulitis in the right pelvis. Vascular/Lymphatic: Aortic atherosclerosis. No enlarged abdominal or pelvic lymph nodes. Reproductive: Calcified uterine fibroids are identified measuring up to 4.1 cm. Ovaries are not well delineated on this study. Other: Small amount of free fluid in the pelvis. Small fat containing umbilical hernia. Musculoskeletal: Degenerative changes affect the spine. IMPRESSION: 1. Small amount of free air throughout the abdomen and pelvis compatible with bowel perforation. Exact site of perforation is unknown, but there is free air clustered in the right lower quadrant surrounding ileal loops. 2. Findings suspicious for early or partial distal small-bowel obstruction. Definitive transition point visualized. 3. Sigmoid colon diverticulosis. Questionable mild sigmoid colon diverticulitis. 4.  Small amount of free fluid in the pelvis. 5.  Fibroid uterus. 6.Aortic Atherosclerosis (ICD10-I70.0). These results were called by telephone at the time of interpretation on 02/05/2022 at 10:26 pm to provider [facility] , who verbally acknowledged these results. Electronically Signed   By: Ronney Asters M.D.   On: 02/05/2022 22:29   ? ?Anti-infectives: ?Anti-infectives (From admission, onward)  ? ? Start     Dose/Rate Route Frequency Ordered Stop  ? 02/06/22 2200  vancomycin (VANCOREADY) IVPB 750 mg/150 mL       ? 750 mg ?150 mL/hr over 60 Minutes Intravenous Every 24 hours 02/05/22 2308    ? 02/05/22 2300  levofloxacin (LEVAQUIN) IVPB 750 mg       ? 750 mg ?100 mL/hr over 90 Minutes Intravenous Every 48 hours 02/05/22 2248    ? 02/05/22 2300  vancomycin (VANCOCIN) IVPB 1000 mg/200 mL premix       ? 1,000 mg ?200 mL/hr over 60 Minutes Intravenous  Once 02/05/22 2249 02/05/22 2357  ? ?  ? ? ? ?Assessment/Plan ? Pneumoperitoneum with pelvic abscess ?POD2 s/p Exploratory laparotomy, proctoscopy, appendectomy 02/06/22 Dr. Thermon Leyland ?- NGT out, belching but no flatus - keep ice  chips and sips for now ?- ambulate, PT/OT ?- DC foley today  ?- reordered IV tylenol  ?- await return in bowel function  ?- WBC 14.6, afebrile, IV abx for 5 days total post-op ?  ?FEN: NPO, ice chips, IVF 100cc/h ?VTE: LMWH ?ID: levaquin and vanc  ?  ?HLD ?HTN ?Graves disease  ? LOS: 1 day  ? ?I reviewed last 24 h vitals and pain scores, last 48 h intake and output, and last 24 h labs and trends. ? ?This care required low level of medical decision making.  ? ? ?Norm Parcel, PA-C ?[facility] ?02/07/2022, 7:37 AM ?Please see Amion for pager number during day hours 7:00am-4:30pm ? ?

## 2022-02-07 NOTE — Anesthesia Postprocedure Evaluation (Signed)
Anesthesia Post Note ? ?Patient: Darianny Momon Schappell ? ?Procedure(s) Performed: EXPLORATORY LAPAROTOMY PROCTOSIGMOIDOSCOPY APPENDECTOMY (Abdomen) ? ?  ? ?Patient location during evaluation: PACU ?Anesthesia Type: General ?Level of consciousness: awake and alert ?Pain management: pain level controlled ?Vital Signs Assessment: post-procedure vital signs reviewed and stable ?Respiratory status: spontaneous breathing, nonlabored ventilation, respiratory function stable and patient connected to nasal cannula oxygen ?Cardiovascular status: blood pressure returned to baseline and stable ?Postop Assessment: no apparent nausea or vomiting ?Anesthetic complications: no ? ? ?No notable events documented. ? ?Last Vitals:  ?Vitals:  ? 02/07/22 0630 02/07/22 0841  ?BP: 106/65 116/60  ?Pulse: 77 69  ?Resp: 17 18  ?Temp: 36.7 ?C 36.7 ?C  ?SpO2: 94% 94%  ?  ?Last Pain:  ?Vitals:  ? 02/07/22 0841  ?TempSrc: Oral  ?PainSc:   ? ? ?  ?  ?  ?  ?  ?  ? ?Suzette Battiest E ? ? ? ? ?

## 2022-02-07 NOTE — Evaluation (Signed)
Physical Therapy Evaluation ?Patient Details ?Name: Caitlin Bowers ?MRN: [id number] ?DOB: Apr 28, 1951 ?Today's Date: 02/07/2022 ? ?History of Present Illness ? 71 yo female presents to [facility] on 3/5 with abdominal pain, n/v/d. s/p ex lap, proctoscopy, appendectomy on 3/6. PMH includes Graves' disease, hypertension, hyperlipidemia, OA, cataracts extraction 2021.  ?Clinical Impression ? Pt presents with decreased functional mobility, endurance, and strength secondary to abdominal surgery. Pt. Had one episode of emesis, bile colored, that was cleaned up during the session. These impairments are limiting her ability to safely and independently transfer, get into her home, perform all adls/iadls, and ambulate in the community. Pt to benefit from acute PT to address deficits. Bed mobility, functional transfers, adls/iadls, and gait were performed and pt ambulated 150 feet with IV pole assist.  Pt. Responded well but was limited secondary to pain and overall endurance. SPT recommends no PT follow up upon discharge due to good family support and expected quick return to baseline level of functioning. PT to progress mobility as tolerated, and will continue to follow acutely.  ?   ?   ? ?Recommendations for follow up therapy are one component of a multi-disciplinary discharge planning process, led by the attending physician.  Recommendations may be updated based on patient status, additional functional criteria and insurance authorization. ? ?Follow Up Recommendations No PT follow up ? ?  ?Assistance Recommended at Discharge PRN  ?Patient can return home with the following ? A little help with walking and/or transfers;A little help with bathing/dressing/bathroom;Assist for transportation ? ?  ?Equipment Recommendations None recommended by PT  ?Recommendations for Other Services ? Other (comment) (Mobility)  ?  ?Functional Status Assessment Patient has had a recent decline in their functional status and demonstrates the  ability to make significant improvements in function in a reasonable and predictable amount of time.  ? ?  ?Precautions / Restrictions Precautions ?Precautions: Fall ?Precaution Comments: JP drain ?Restrictions ?Weight Bearing Restrictions: No  ? ?  ? ?Mobility ? Bed Mobility ?Overal bed mobility: Needs Assistance ?Bed Mobility: Supine to Sit ?  ?  ?Supine to sit: Min guard ?  ?  ?General bed mobility comments: increased time, sequencing cues ?  ? ?Transfers ?Overall transfer level: Needs assistance ?Equipment used: 1 person hand held assist ?Transfers: Sit to/from Stand ?Sit to Stand: Min assist, Min guard ?  ?  ?  ?  ?  ?General transfer comment: x3, from bed, from commode, to chair. increased time and min gurad for saftey ?  ? ?Ambulation/Gait ?Ambulation/Gait assistance: Min guard ?Gait Distance (Feet): 150 Feet ?Assistive device: IV Pole ?Gait Pattern/deviations: Step-through pattern, Decreased stride length, Trunk flexed ?Gait velocity: decreased ?  ?  ?General Gait Details: Pt. reaches out with hand for support with L hand when walking ? ?Stairs ?  ?  ?  ?  ?  ? ?Wheelchair Mobility ?  ? ?Modified Rankin (Stroke Patients Only) ?  ? ?  ? ?Balance Overall balance assessment: Needs assistance ?Sitting-balance support: No upper extremity supported ?Sitting balance-Leahy Scale: Good ?  ?  ?Standing balance support: Single extremity supported ?Standing balance-Leahy Scale: Poor ?Standing balance comment: Able to stand without support but reaches out with at least 1 hand for any dynamic movement ?  ?  ?  ?  ?  ?  ?  ?  ?  ?  ?  ?   ? ? ? ?Pertinent Vitals/Pain Pain Assessment ?Pain Assessment: Faces ?Faces Pain Scale: Hurts even more ?Pain Location: Stomach ?Pain Descriptors /  Indicators: Aching, Guarding, Tender ?Pain Intervention(s): Limited activity within patient's tolerance  ? ? ?Home Living Family/patient expects to be discharged to:: Private residence ?Living Arrangements: Spouse/significant other ?Available  Help at Discharge: Family;Available 24 hours/day ?Type of Home: House ?Home Access: Stairs to enter ?Entrance Stairs-Rails: Right;Left ?Entrance Stairs-Number of Steps: 3 ?  ?Home Layout: Two level;Able to live on main level with bedroom/bathroom ?Home Equipment: None ?   ?  ?Prior Function Prior Level of Function : Independent/Modified Independent ?  ?  ?  ?  ?  ?  ?  ?  ?  ? ? ?Hand Dominance  ? Dominant Hand: Right ? ?  ?Extremity/Trunk Assessment  ? Upper Extremity Assessment ?Upper Extremity Assessment: Overall WFL for tasks assessed ?  ? ?Lower Extremity Assessment ?Lower Extremity Assessment: Overall WFL for tasks assessed ?  ? ?Cervical / Trunk Assessment ?Cervical / Trunk Assessment: Normal  ?Communication  ? Communication: No difficulties  ?Cognition Arousal/Alertness: Awake/alert ?Behavior During Therapy: [facility] for tasks assessed/performed ?Overall Cognitive Status: Within Functional Limits for tasks assessed ?  ?  ?  ?  ?  ?  ?  ?  ?  ?  ?  ?  ?  ?  ?  ?  ?  ?  ?  ? ?  ?General Comments   ? ?  ?Exercises    ? ?Assessment/Plan  ?  ?PT Assessment Patient needs continued PT services  ?PT Problem List Decreased strength;Decreased mobility;Decreased coordination;Decreased activity tolerance;Decreased balance;Decreased knowledge of use of DME;Pain ? ?   ?  ?PT Treatment Interventions DME instruction;Therapeutic exercise;Gait training;Balance training;Neuromuscular re-education;Functional mobility training;Cognitive remediation;Stair training;Therapeutic activities;Patient/family education   ? ?PT Goals (Current goals can be found in the Care Plan section)  ?Acute Rehab PT Goals ?Patient Stated Goal: Increase movement capacity to help return home ?PT Goal Formulation: With patient/family ?Time For Goal Achievement: 02/21/22 ?Potential to Achieve Goals: Good ? ?  ?Frequency Min 3X/week ?  ? ? ?Co-evaluation   ?  ?  ?  ?  ? ? ?  ?AM-PAC PT "6 Clicks" Mobility  ?Outcome Measure Help needed turning from your back to  your side while in a flat bed without using bedrails?: A Little ?Help needed moving from lying on your back to sitting on the side of a flat bed without using bedrails?: A Little ?Help needed moving to and from a bed to a chair (including a wheelchair)?: A Little ?Help needed standing up from a chair using your arms (e.g., wheelchair or bedside chair)?: A Little ?Help needed to walk in hospital room?: A Little ?Help needed climbing 3-5 steps with a railing? : A Little ?6 Click Score: 18 ? ?  ?End of Session Equipment Utilized During Treatment: Gait belt ?Activity Tolerance: Patient tolerated treatment well ?Patient left: in chair;with call bell/phone within reach;with family/visitor present ?Nurse Communication: Mobility status ?PT Visit Diagnosis: Unsteadiness on feet (R26.81);Muscle weakness (generalized) (M62.81);Pain ?Pain - Right/Left:  (B) ?Pain - part of body:  (abdomen) ?  ? ?Time: 7939-0300 ?PT Time Calculation (min) (ACUTE ONLY): 28 min ? ? ?Charges:   PT Evaluation ?$PT Eval Low Complexity: 1 Low ?PT Treatments ?$Therapeutic Activity: 8-22 mins ?  ?   ? ? ?Thermon Leyland, SPT ?Acute Rehab Services ? ? ?Thermon Leyland ?02/07/2022, 2:57 PM ? ?

## 2022-02-07 NOTE — Progress Notes (Signed)
NGT insertion attempted x 2. Unsuccessful. PA notified. ?

## 2022-02-07 NOTE — Progress Notes (Signed)
Pt vomitted bile 50 cc x 2 separate events. Denies passing flatus. Abdomen distended, no bowel sounds present. PA notified. Orders received. ?

## 2022-02-08 ENCOUNTER — Inpatient Hospital Stay (HOSPITAL_COMMUNITY): Payer: Medicare Other

## 2022-02-08 LAB — CBC
HCT: 30.8 % — ABNORMAL LOW (ref 36.0–46.0)
Hemoglobin: 10 g/dL — ABNORMAL LOW (ref 12.0–15.0)
MCH: 31.2 pg (ref 26.0–34.0)
MCHC: 32.5 g/dL (ref 30.0–36.0)
MCV: 96 fL (ref 80.0–100.0)
Platelets: 200 10*3/uL (ref 150–400)
RBC: 3.21 MIL/uL — ABNORMAL LOW (ref 3.87–5.11)
RDW: 13.4 % (ref 11.5–15.5)
WBC: 11.9 10*3/uL — ABNORMAL HIGH (ref 4.0–10.5)
nRBC: 0 % (ref 0.0–0.2)

## 2022-02-08 LAB — BASIC METABOLIC PANEL
Anion gap: 9 (ref 5–15)
BUN: 23 mg/dL (ref 8–23)
CO2: 18 mmol/L — ABNORMAL LOW (ref 22–32)
Calcium: 8 mg/dL — ABNORMAL LOW (ref 8.9–10.3)
Chloride: 101 mmol/L (ref 98–111)
Creatinine, Ser: 1.3 mg/dL — ABNORMAL HIGH (ref 0.44–1.00)
GFR, Estimated: 44 mL/min — ABNORMAL LOW (ref >60)
Glucose, Bld: 105 mg/dL — ABNORMAL HIGH (ref 70–99)
Potassium: 3.4 mmol/L — ABNORMAL LOW (ref 3.5–5.1)
Sodium: 128 mmol/L — ABNORMAL LOW (ref 135–145)

## 2022-02-08 LAB — SURGICAL PATHOLOGY

## 2022-02-08 LAB — MAGNESIUM: Magnesium: 1.9 mg/dL (ref 1.7–2.4)

## 2022-02-08 MED ORDER — ACETAMINOPHEN 10 MG/ML IV SOLN
1000.0000 mg | Freq: Four times a day (QID) | INTRAVENOUS | Status: AC
  Administered 2022-02-08 – 2022-02-09 (×4): 1000 mg via INTRAVENOUS
  Filled 2022-02-08 (×4): qty 100

## 2022-02-08 MED ORDER — POTASSIUM CHLORIDE 10 MEQ/100ML IV SOLN
10.0000 meq | INTRAVENOUS | Status: AC
  Administered 2022-02-08 (×2): 10 meq via INTRAVENOUS
  Filled 2022-02-08 (×2): qty 100

## 2022-02-08 MED ORDER — VANCOMYCIN HCL IN DEXTROSE 1-5 GM/200ML-% IV SOLN
1000.0000 mg | INTRAVENOUS | Status: DC
  Administered 2022-02-09 (×2): 1000 mg via INTRAVENOUS
  Filled 2022-02-08 (×2): qty 200

## 2022-02-08 MED ORDER — HYDRALAZINE HCL 20 MG/ML IJ SOLN
5.0000 mg | Freq: Three times a day (TID) | INTRAMUSCULAR | Status: DC | PRN
  Administered 2022-02-08: 5 mg via INTRAVENOUS
  Filled 2022-02-08: qty 1

## 2022-02-08 MED ORDER — PHENOL 1.4 % MT LIQD
1.0000 | OROMUCOSAL | Status: DC | PRN
  Administered 2022-02-08: 1 via OROMUCOSAL
  Filled 2022-02-08: qty 177

## 2022-02-08 MED ORDER — METHOCARBAMOL 1000 MG/10ML IJ SOLN
500.0000 mg | Freq: Three times a day (TID) | INTRAVENOUS | Status: DC
  Administered 2022-02-08 – 2022-02-10 (×6): 500 mg via INTRAVENOUS
  Filled 2022-02-08 (×2): qty 500
  Filled 2022-02-08: qty 5
  Filled 2022-02-08 (×3): qty 500
  Filled 2022-02-08: qty 5
  Filled 2022-02-08: qty 500

## 2022-02-08 MED ORDER — METOPROLOL TARTRATE 5 MG/5ML IV SOLN: 5.0000 mg | Freq: Four times a day (QID) | INTRAVENOUS | Status: DC | PRN

## 2022-02-08 MED ORDER — LIDOCAINE VISCOUS HCL 2 % MT SOLN
6.0000 mL | Freq: Once | OROMUCOSAL | Status: AC
  Administered 2022-02-08: 6 mL via OROMUCOSAL
  Filled 2022-02-08: qty 15

## 2022-02-08 MED ORDER — ENOXAPARIN SODIUM 40 MG/0.4ML IJ SOSY
40.0000 mg | PREFILLED_SYRINGE | INTRAMUSCULAR | Status: DC
  Administered 2022-02-08 – 2022-02-12 (×5): 40 mg via SUBCUTANEOUS
  Filled 2022-02-08 (×5): qty 0.4

## 2022-02-08 MED ORDER — MENTHOL 3 MG MT LOZG: 1.0000 | LOZENGE | OROMUCOSAL | Status: DC | PRN

## 2022-02-08 NOTE — Progress Notes (Signed)
Mobility Specialist Progress Note: ? ? 02/08/22 1246  ?Mobility  ?Activity Ambulated with assistance in hallway  ?Level of Assistance Standby assist, set-up cues, supervision of patient - no hands on  ?Assistive Device  ?(IV pole)  ?Distance Ambulated (ft) 180 ft  ?Activity Response Tolerated well  ?$Mobility charge 1 Mobility  ? ?Pt received in bed willing to participate in mobility. Complaint of pain at incision site when standing but stated once walking pain eases up. Left in chair with call bell in reach and all needs met.  ? ?Amandeep Nesmith ?Mobility Specialist ?Primary Phone [phone] ? ?

## 2022-02-08 NOTE — Progress Notes (Signed)
Pharmacy Antibiotic Note ? ?Caitlin Bowers is a 71 y.o. female admitted on 02/05/2022 with  intra-abdominal infection .  Pharmacy has been consulted for Vancomycin dosing. ? ?Upon admission, patient had a significant AKI with Scr of 2.6. Given severe hives with penicillins, vancomycin and levaquin were empirically started. AKI now resolving, Scr down to 1.3. Will adjust vancomycin.  ? ?Plan: ?Increase Vancomycin to '1000mg'$  Q24h (Goal AUC 400-550, eAUC 484, Scr 1.3) ?Levofloxacin '750mg'$  IV Q24h ? ?Height: '5\' 10"'$  (177.8 cm) ?Weight: 72.6 kg (160 lb) ?IBW/kg (Calculated) : 68.5 ? ?Temp (24hrs), Avg:98.2 ?F (36.8 ?C), Min:97.7 ?F (36.5 ?C), Max:98.6 ?F (37 ?C) ? ?Recent Labs  ?Lab 02/05/22 ?1957 02/05/22 ?2245 02/06/22 ?2458 02/07/22 ?0118 02/08/22 ?0201  ?WBC 20.1*  --  15.0* 14.6* 11.9*  ?CREATININE 2.63*  --  2.70*  2.72* 2.14* 1.30*  ?LATICACIDVEN  --  1.5 1.3  --   --   ?  ?Estimated Creatinine Clearance: 42.3 mL/min (A) (by C-G formula based on SCr of 1.3 mg/dL (H)).   ? ?Allergies  ?Allergen Reactions  ? Oxybutynin   ?  Dry mouth; no taste; not effective for incontinence  ? Penicillins Hives  ? Sulfa Antibiotics Hives  ? ? ?Antimicrobials this admission: ?Vancomycin 3/05 >> (3/11) ?Levofloxacin 3/05 >> (3/11) ? ?Microbiology results: ?3/05 BCx: NGTD ?3/05 UCx: 100k strep agalactiae, e faecalis (S pending)  ? ? ?Thank you for allowing pharmacy to be a part of this patient?s care. ? ?Ardyth Harps, PharmD ?Clinical Pharmacist ? ? ?

## 2022-02-08 NOTE — Progress Notes (Signed)
? ?Progress Note ? ?3 Days Post-Op  ?Subjective: ?Patient vomited yesterday afternoon and overnight, attempts to insert NGT on floor were unsuccessful. NGT was placed in radiology this AM. Pt is passing some flatus but infrequent and still has some nausea  ? ?Objective: ?Vital signs in last 24 hours: ?Temp:  [97.7 ?F (36.5 ?C)-98.6 ?F (37 ?C)] 98.2 ?F (36.8 ?C) (03/08 0719) ?Pulse Rate:  [72-81] 75 (03/08 0719) ?Resp:  [17-19] 17 (03/08 0719) ?BP: (134-151)/(68-73) 140/71 (03/08 0719) ?SpO2:  [93 %-96 %] 93 % (03/08 0719) ?Last BM Date : 02/04/22 ? ?Intake/Output from previous day: ?03/07 0701 - 03/08 0700 ?In: 1843.5 [I.V.:1743.5; IV Piggyback:100] ?Out: 1405 [Urine:1125; Emesis/NG output:50; Drains:230] ?Intake/Output this shift: ?No intake/output data recorded. ? ?PE: ?General: pleasant, WD, [name] who is laying in bed in NAD ?Heart: regular, rate, and rhythm.  ?Lungs:  Respiratory effort nonlabored ?Abd: soft, appropriately ttp, mild distention, incision with staples present and penrose drain inferiorly, drain in RLQ with SS fluid, NGT with bilious drainage ?Psych: A&Ox3 with an appropriate affect.  ? ? ?Lab Results:  ?Recent Labs  ?  02/07/22 ?0118 02/08/22 ?0201  ?WBC 14.6* 11.9*  ?HGB 10.0* 10.0*  ?HCT 29.8* 30.8*  ?PLT 176 200  ? ? ?BMET ?Recent Labs  ?  02/07/22 ?0118 02/08/22 ?0201  ?NA 129* 128*  ?K 4.0 3.4*  ?CL 99 101  ?CO2 20* 18*  ?GLUCOSE 121* 105*  ?BUN 35* 23  ?CREATININE 2.14* 1.30*  ?CALCIUM 8.0* 8.0*  ? ? ?PT/INR ?No results for input(s): LABPROT, INR in the last 72 hours. ?CMP  ?   ?Component Value Date/Time  ? NA 128 (L) 02/08/2022 0201  ? K 3.4 (L) 02/08/2022 0201  ? CL 101 02/08/2022 0201  ? CO2 18 (L) 02/08/2022 0201  ? GLUCOSE 105 (H) 02/08/2022 0201  ? BUN 23 02/08/2022 0201  ? CREATININE 1.30 (H) 02/08/2022 0201  ? CALCIUM 8.0 (L) 02/08/2022 0201  ? PROT 7.0 02/05/2022 1957  ? ALBUMIN 4.2 02/05/2022 1957  ? AST 16 02/05/2022 1957  ? ALT 17 02/05/2022 1957  ? ALKPHOS 63 02/05/2022  1957  ? BILITOT 2.3 (H) 02/05/2022 1957  ? GFRNONAA 44 (L) 02/08/2022 0201  ? ?Lipase  ?   ?Component Value Date/Time  ? LIPASE <10 (L) 02/05/2022 1957  ? ? ? ? ? ?Studies/Results: ?DG Abd 1 View ? ?Result Date: 02/08/2022 ?CLINICAL DATA:  Nasogastric placement EXAM: ABDOMEN - 1 VIEW COMPARISON:  02/07/2022 FINDINGS: Nasogastric tube extends below the diaphragm and has its tip in the expected location of the fundus of the stomach. Mild gaseous distension of the small bowel is partially visualized. IMPRESSION: Nasogastric tube tip in the fundus of the stomach. Electronically Signed   By: Nelson Chimes M.D.   On: 02/08/2022 08:21  ? ?DG Abd Portable 1V ? ?Result Date: 02/07/2022 ?CLINICAL DATA:  Ileus, patient states patient has vomited a couple of times and has some belly pain EXAM: PORTABLE ABDOMEN - 1 VIEW COMPARISON:  CT examination dated February 05, 2022 FINDINGS: There are multiple distended small and large bowel loops measuring up to 5.2 cm. No radio-opaque calculi or other significant radiographic abnormality are seen. No appreciable extraluminal free air on this supine examination. IMPRESSION: Gaseous distention of small and large bowel loops concerning for ileus/obstruction. Electronically Signed   By: Keane Police D.O.   On: 02/07/2022 16:05   ? ?Anti-infectives: ?Anti-infectives (From admission, onward)  ? ? Start     Dose/Rate Route Frequency  Ordered Stop  ? 02/06/22 2200  vancomycin (VANCOREADY) IVPB 750 mg/150 mL       ? 750 mg ?150 mL/hr over 60 Minutes Intravenous Every 24 hours 02/05/22 2308    ? 02/05/22 2300  levofloxacin (LEVAQUIN) IVPB 750 mg       ? 750 mg ?100 mL/hr over 90 Minutes Intravenous Every 48 hours 02/05/22 2248    ? 02/05/22 2300  vancomycin (VANCOCIN) IVPB 1000 mg/200 mL premix       ? 1,000 mg ?200 mL/hr over 60 Minutes Intravenous  Once 02/05/22 2249 02/05/22 2357  ? ?  ? ? ? ?Assessment/Plan ? Pneumoperitoneum with pelvic abscess ?POD3 s/p Exploratory laparotomy, proctoscopy,  appendectomy 02/06/22 Dr. Thermon Leyland ?- NGT reinserted this AM for post-op ileus  ?- ambulate, PT/OT ?- reordered IV tylenol, ok to scheduled IV robaxin today  ?- await return in bowel function  ?- WBC 11.9, afebrile, IV abx for 5 days total post-op ?- drain SS, continue for now  ?- will remove penrose drain prior to DC ?- hold all PO meds at this point, will order PRNs for HTN ?  ?FEN: NPO, ice chips, IVF 100cc/h, NGT to LIWS ?VTE: LMWH ?ID: levaquin and vanc  ?  ?HLD ?HTN ?Graves disease  ? LOS: 2 days  ? ? ?Norm Parcel, PA-C ?[facility] ?02/08/2022, 9:20 AM ?Please see Amion for pager number during day hours 7:00am-4:30pm ? ?

## 2022-02-08 NOTE — Evaluation (Signed)
Occupational Therapy Evaluation ?Patient Details ?Name: Caitlin Bowers ?MRN: [id number] ?DOB: [birth year] ?Today's Date: 02/08/2022 ? ? ?History of Present Illness 71 yo female presents to [facility] on 3/5 with abdominal pain, n/v/d. s/p ex lap, proctoscopy, appendectomy on 3/6. NGT placed in radiology 02/08/22. PMH includes Graves' disease, hypertension, hyperlipidemia, OA, cataracts extraction 2021.  ? ?Clinical Impression ?  ?Pt was independent prior to admission. Presents with abdominal pain, nausea, generalized weakness and impaired dynamic standing balance. She ambulates pushing IV pole with min guard assist and requires set up to min assist for ADLs. Pt likely to progress well. Educated in splinting abdomen with pillow and log roll technique for bed mobility to minimize pain.  ?   ? ?Recommendations for follow up therapy are one component of a multi-disciplinary discharge planning process, led by the attending physician.  Recommendations may be updated based on patient status, additional functional criteria and insurance authorization.  ? ?Follow Up Recommendations ? No OT follow up  ?  ?Assistance Recommended at Discharge Intermittent Supervision/Assistance  ?Patient can return home with the following A little help with walking and/or transfers;A little help with bathing/dressing/bathroom ? ?  ?Functional Status Assessment ? Patient has had a recent decline in their functional status and demonstrates the ability to make significant improvements in function in a reasonable and predictable amount of time.  ?Equipment Recommendations ? None recommended by OT  ?  ?Recommendations for Other Services   ? ? ?  ?Precautions / Restrictions Precautions ?Precautions: Fall ?Precaution Comments: JP drain, NGT to intermittent suction ?Restrictions ?Weight Bearing Restrictions: No  ? ?  ? ?Mobility Bed Mobility ?  ?  ?  ?  ?  ?  ?  ?General bed mobility comments: pt up in chair ?  ? ?Transfers ?Overall transfer level: Needs  assistance ?  ?Transfers: Sit to/from Stand ?Sit to Stand: Min guard ?  ?  ?  ?  ?  ?General transfer comment: slow to rise ?  ? ?  ?Balance Overall balance assessment: Needs assistance ?  ?Sitting balance-Leahy Scale: Good ?  ?  ?Standing balance support: Single extremity supported ?Standing balance-Leahy Scale: Fair ?Standing balance comment: fair static, supporting with IV pole during ambulation ?  ?  ?  ?  ?  ?  ?  ?  ?  ?  ?  ?   ? ?ADL either performed or assessed with clinical judgement  ? ?ADL Overall ADL's : Needs assistance/impaired ?Eating/Feeding: Independent;Sitting ?Eating/Feeding Details (indicate cue type and reason): ice chips ?Grooming: Oral care;Set up;Standing ?  ?Upper Body Bathing: Minimal assistance;Sitting ?  ?Lower Body Bathing: Minimal assistance;Sit to/from stand ?  ?Upper Body Dressing : Minimal assistance;Sitting ?  ?Lower Body Dressing: Minimal assistance;Sit to/from stand ?  ?Toilet Transfer: Min guard;Ambulation ?  ?Toileting- Water quality scientist and Hygiene: Min guard;Sit to/from stand ?  ?  ?  ?Functional mobility during ADLs: Min guard (pushing IV pole) ?General ADL Comments: Pt not able to perform figure 4 for donning socks due to abdominal pain, but typically can.  ? ? ? ?Vision Ability to See in Adequate Light: 0 Adequate ?Patient Visual Report: No change from baseline ?   ?   ?Perception   ?  ?Praxis   ?  ? ?Pertinent Vitals/Pain Pain Assessment ?Pain Assessment: Faces ?Faces Pain Scale: Hurts even more ?Pain Location: abdomen ?Pain Descriptors / Indicators: Aching, Guarding, Tender ?Pain Intervention(s): Monitored during session, Repositioned  ? ? ? ?Hand Dominance Right ?  ?Extremity/Trunk Assessment Upper  Extremity Assessment ?Upper Extremity Assessment: Overall WFL for tasks assessed ?  ?Lower Extremity Assessment ?Lower Extremity Assessment: Defer to PT evaluation ?  ?Cervical / Trunk Assessment ?Cervical / Trunk Assessment: Other exceptions ?Cervical / Trunk Exceptions:  s/p abdominal sx ?  ?Communication Communication ?Communication: No difficulties ?  ?Cognition Arousal/Alertness: Awake/alert ?Behavior During Therapy: [facility] for tasks assessed/performed ?Overall Cognitive Status: Within Functional Limits for tasks assessed ?  ?  ?  ?  ?  ?  ?  ?  ?  ?  ?  ?  ?  ?  ?  ?  ?  ?  ?  ?General Comments    ? ?  ?Exercises   ?  ?Shoulder Instructions    ? ? ?Home Living Family/patient expects to be discharged to:: Private residence ?Living Arrangements: Spouse/significant other ?Available Help at Discharge: Family;Available 24 hours/day ?Type of Home: House ?Home Access: Stairs to enter ?Entrance Stairs-Number of Steps: 3 ?Entrance Stairs-Rails: Right;Left ?Home Layout: Two level;Able to live on main level with bedroom/bathroom ?  ?  ?Bathroom Shower/Tub: Walk-in shower ?  ?Bathroom Toilet: Standard ?  ?  ?Home Equipment: Other (comment) (IV pole) ?  ?  ?  ? ?  ?Prior Functioning/Environment Prior Level of Function : Independent/Modified Independent ?  ?  ?  ?  ?  ?  ?  ?  ?  ? ?  ?  ?OT Problem List: Decreased strength;Impaired balance (sitting and/or standing);Pain ?  ?   ?OT Treatment/Interventions: Self-care/ADL training;Therapeutic activities;Patient/family education  ?  ?OT Goals(Current goals can be found in the care plan section) Acute Rehab OT Goals ?OT Goal Formulation: With patient ?Time For Goal Achievement: 02/22/22 ?Potential to Achieve Goals: Good ?ADL Goals ?Pt Will Perform Grooming: standing;with supervision ?Pt Will Perform Lower Body Bathing: sit to/from stand;with supervision ?Pt Will Perform Lower Body Dressing: sit to/from stand;with supervision ?Pt Will Transfer to Toilet: ambulating;with supervision ?Pt Will Perform Toileting - Clothing Manipulation and hygiene: with supervision;sit to/from stand ?Additional ADL Goal #1: Pt will perform bed mobility modified independently using log roll technique.  ?OT Frequency: Min 2X/week ?  ? ?Co-evaluation   ?  ?  ?  ?  ? ?   ?AM-PAC OT "6 Clicks" Daily Activity     ?Outcome Measure Help from another person eating meals?: None ?Help from another person taking care of personal grooming?: A Little ?Help from another person toileting, which includes using toliet, bedpan, or urinal?: A Little ?Help from another person bathing (including washing, rinsing, drying)?: A Little ?Help from another person to put on and taking off regular upper body clothing?: A Little ?Help from another person to put on and taking off regular lower body clothing?: A Little ?6 Click Score: 19 ?  ?End of Session   ? ?Activity Tolerance: Patient tolerated treatment well ?Patient left: in chair;with call bell/phone within reach;with family/visitor present ? ?OT Visit Diagnosis: Pain;Unsteadiness on feet (R26.81)  ?              ?Time: 1287-8676 ?OT Time Calculation (min): 16 min ?Charges:  OT General Charges ?$OT Visit: 1 Visit ?OT Evaluation ?$OT Eval Moderate Complexity: 1 Mod ? ?Nestor Lewandowsky, OTR/L ?Acute Rehabilitation Services ?Pager: [phone] ?Office: [phone]  ? ?Caitlin Bowers ?02/08/2022, 1:24 PM ?

## 2022-02-09 LAB — BASIC METABOLIC PANEL
Anion gap: 9 (ref 5–15)
BUN: 15 mg/dL (ref 8–23)
CO2: 19 mmol/L — ABNORMAL LOW (ref 22–32)
Calcium: 8.4 mg/dL — ABNORMAL LOW (ref 8.9–10.3)
Chloride: 105 mmol/L (ref 98–111)
Creatinine, Ser: 1.1 mg/dL — ABNORMAL HIGH (ref 0.44–1.00)
GFR, Estimated: 53 mL/min — ABNORMAL LOW (ref >60)
Glucose, Bld: 96 mg/dL (ref 70–99)
Potassium: 3.3 mmol/L — ABNORMAL LOW (ref 3.5–5.1)
Sodium: 133 mmol/L — ABNORMAL LOW (ref 135–145)

## 2022-02-09 LAB — CBC
HCT: 29.4 % — ABNORMAL LOW (ref 36.0–46.0)
Hemoglobin: 10.1 g/dL — ABNORMAL LOW (ref 12.0–15.0)
MCH: 32.2 pg (ref 26.0–34.0)
MCHC: 34.4 g/dL (ref 30.0–36.0)
MCV: 93.6 fL (ref 80.0–100.0)
Platelets: 183 10*3/uL (ref 150–400)
RBC: 3.14 MIL/uL — ABNORMAL LOW (ref 3.87–5.11)
RDW: 13.6 % (ref 11.5–15.5)
WBC: 9 10*3/uL (ref 4.0–10.5)
nRBC: 0 % (ref 0.0–0.2)

## 2022-02-09 LAB — URINE CULTURE: Culture: >=100000 — AB

## 2022-02-09 MED ORDER — HYDRALAZINE HCL 20 MG/ML IJ SOLN
5.0000 mg | Freq: Two times a day (BID) | INTRAMUSCULAR | Status: DC
  Administered 2022-02-09 (×2): 5 mg via INTRAVENOUS
  Filled 2022-02-09 (×2): qty 1

## 2022-02-09 MED ORDER — ACETAMINOPHEN 10 MG/ML IV SOLN
1000.0000 mg | Freq: Four times a day (QID) | INTRAVENOUS | Status: AC
  Administered 2022-02-09 – 2022-02-10 (×4): 1000 mg via INTRAVENOUS
  Filled 2022-02-09 (×4): qty 100

## 2022-02-09 MED ORDER — MELATONIN 3 MG PO TABS
3.0000 mg | ORAL_TABLET | Freq: Every day | ORAL | Status: DC
  Administered 2022-02-09: 3 mg via ORAL
  Filled 2022-02-09 (×2): qty 1

## 2022-02-09 MED ORDER — LORAZEPAM 2 MG/ML IJ SOLN
0.5000 mg | Freq: Every evening | INTRAMUSCULAR | Status: DC | PRN
  Administered 2022-02-09: 0.5 mg via INTRAVENOUS
  Filled 2022-02-09: qty 1

## 2022-02-09 MED ORDER — POTASSIUM CHLORIDE 10 MEQ/100ML IV SOLN
10.0000 meq | INTRAVENOUS | Status: AC
  Administered 2022-02-09 (×4): 10 meq via INTRAVENOUS
  Filled 2022-02-09 (×4): qty 100

## 2022-02-09 MED ORDER — POTASSIUM CHLORIDE IN NACL 20-0.9 MEQ/L-% IV SOLN
INTRAVENOUS | Status: DC
  Filled 2022-02-09 (×5): qty 1000

## 2022-02-09 NOTE — Progress Notes (Signed)
Clamped/d/cd suction to NG tube per doctor's orders.  ?

## 2022-02-09 NOTE — Progress Notes (Signed)
PT Cancellation Note ? ?Patient Details ?Name: Rumi Kolodziej Marrs ?MRN: [id number] ?DOB: Nov 06, 1951 ? ? ?Cancelled Treatment:    Reason Eval/Treat Not Completed: Fatigue/lethargy limiting ability to participate;Other (comment) RN stating pt walked with spouse and has been up to BR multiple times and now requesting rest and to not interrupt, PTA in compliance. Will re-attempt tomorrow to cont with POC.  ? ? ?Betsey Holiday Quantrell Splitt ?02/09/2022, 3:57 PM ?

## 2022-02-09 NOTE — Progress Notes (Signed)
Patient has tolerated having ng suction off well. She drank some apple juice and had an icee. ?

## 2022-02-09 NOTE — Care Management Important Message (Signed)
Important Message ? ?Patient Details  ?Name: Caitlin Bowers ?MRN: [id number] ?Date of Birth: March 25, 1951 ? ? ?Medicare Important Message Given:  Yes ? ? ? ? ?Levada Dy  Akasha Melena-Martin ?02/09/2022, 11:31 AM ?

## 2022-02-09 NOTE — Progress Notes (Signed)
? ?Progress Note ? ?4 Days Post-Op  ?Subjective: ?Patient passing some flatus but not significantly increased from yesterday. Has ~800 cc bilious fluid in drainage canister. Pain well controlled. Her biggest complaint is not being able to sleep. BP has also been up some but went down after getting IV antihypertensive yesterday.  ? ?Objective: ?Vital signs in last 24 hours: ?Temp:  [97.1 ?F (36.2 ?C)-99 ?F (37.2 ?C)] 98 ?F (36.7 ?C) (03/09 7416) ?Pulse Rate:  [72-86] 72 (03/09 0829) ?Resp:  [16-18] 18 (03/09 0829) ?BP: (145-159)/(66-77) 157/72 (03/09 0829) ?SpO2:  [95 %-97 %] 95 % (03/09 0829) ?Last BM Date : 02/04/22 ? ?Intake/Output from previous day: ?03/08 0701 - 03/09 0700 ?In: 150 [IV Piggyback:150] ?Out: 1120 [Urine:500; Emesis/NG output:500; Drains:120] ?Intake/Output this shift: ?Total I/O ?In: -  ?Out: 3845 [Urine:400; Emesis/NG output:700; Drains:75] ? ?PE: ?General: pleasant, WD, [name] who is laying in bed in NAD ?Heart: regular, rate, and rhythm.  ?Lungs:  Respiratory effort nonlabored ?Abd: soft, minimal diffuse ttp, mild distention, +BS, incision with staples present and penrose drain inferiorly, drain in RLQ with SS fluid, NGT with bilious drainage ?Psych: A&Ox3 with an appropriate affect.  ? ? ?Lab Results:  ?Recent Labs  ?  02/08/22 ?0201 02/09/22 ?0309  ?WBC 11.9* 9.0  ?HGB 10.0* 10.1*  ?HCT 30.8* 29.4*  ?PLT 200 183  ? ? ?BMET ?Recent Labs  ?  02/08/22 ?0201 02/09/22 ?0309  ?NA 128* 133*  ?K 3.4* 3.3*  ?CL 101 105  ?CO2 18* 19*  ?GLUCOSE 105* 96  ?BUN 23 15  ?CREATININE 1.30* 1.10*  ?CALCIUM 8.0* 8.4*  ? ? ?PT/INR ?No results for input(s): LABPROT, INR in the last 72 hours. ?CMP  ?   ?Component Value Date/Time  ? NA 133 (L) 02/09/2022 0309  ? K 3.3 (L) 02/09/2022 0309  ? CL 105 02/09/2022 0309  ? CO2 19 (L) 02/09/2022 0309  ? GLUCOSE 96 02/09/2022 0309  ? BUN 15 02/09/2022 0309  ? CREATININE 1.10 (H) 02/09/2022 0309  ? CALCIUM 8.4 (L) 02/09/2022 0309  ? PROT 7.0 02/05/2022 1957  ? ALBUMIN 4.2  02/05/2022 1957  ? AST 16 02/05/2022 1957  ? ALT 17 02/05/2022 1957  ? ALKPHOS 63 02/05/2022 1957  ? BILITOT 2.3 (H) 02/05/2022 1957  ? GFRNONAA 53 (L) 02/09/2022 0309  ? ?Lipase  ?   ?Component Value Date/Time  ? LIPASE <10 (L) 02/05/2022 1957  ? ? ? ? ? ?Studies/Results: ?DG Abd 1 View ? ?Result Date: 02/08/2022 ?CLINICAL DATA:  Nasogastric placement EXAM: ABDOMEN - 1 VIEW COMPARISON:  02/07/2022 FINDINGS: Nasogastric tube extends below the diaphragm and has its tip in the expected location of the fundus of the stomach. Mild gaseous distension of the small bowel is partially visualized. IMPRESSION: Nasogastric tube tip in the fundus of the stomach. Electronically Signed   By: Nelson Chimes M.D.   On: 02/08/2022 08:21  ? ?DG Abd Portable 1V ? ?Result Date: 02/07/2022 ?CLINICAL DATA:  Ileus, patient states patient has vomited a couple of times and has some belly pain EXAM: PORTABLE ABDOMEN - 1 VIEW COMPARISON:  CT examination dated February 05, 2022 FINDINGS: There are multiple distended small and large bowel loops measuring up to 5.2 cm. No radio-opaque calculi or other significant radiographic abnormality are seen. No appreciable extraluminal free air on this supine examination. IMPRESSION: Gaseous distention of small and large bowel loops concerning for ileus/obstruction. Electronically Signed   By: Keane Police D.O.   On: 02/07/2022 16:05   ? ?  Anti-infectives: ?Anti-infectives (From admission, onward)  ? ? Start     Dose/Rate Route Frequency Ordered Stop  ? 02/08/22 2200  vancomycin (VANCOCIN) IVPB 1000 mg/200 mL premix       ? 1,000 mg ?200 mL/hr over 60 Minutes Intravenous Every 24 hours 02/08/22 0950    ? 02/06/22 2200  vancomycin (VANCOREADY) IVPB 750 mg/150 mL  Status:  Discontinued       ? 750 mg ?150 mL/hr over 60 Minutes Intravenous Every 24 hours 02/05/22 2308 02/08/22 0950  ? 02/05/22 2300  levofloxacin (LEVAQUIN) IVPB 750 mg       ? 750 mg ?100 mL/hr over 90 Minutes Intravenous Every 48 hours 02/05/22 2248     ? 02/05/22 2300  vancomycin (VANCOCIN) IVPB 1000 mg/200 mL premix       ? 1,000 mg ?200 mL/hr over 60 Minutes Intravenous  Once 02/05/22 2249 02/05/22 2357  ? ?  ? ? ? ?Assessment/Plan ? Pneumoperitoneum with pelvic abscess ?POD4 s/p Exploratory laparotomy, proctoscopy, appendectomy 02/06/22 Dr. Thermon Leyland ?- NGT with bilious drainage but pt having some flatus - ok to clamp and allow sips of clears  ?- continue to ambulate, PT/OT ?- WBC 9.0, afebrile, IV abx for 5 days total post-op ?- drain SS, continue for now  ?- will remove penrose drain prior to DC ?  ?FEN: ice chips/sips, IVF 100cc/h, NGT clamping  ?VTE: LMWH ?ID: levaquin and vanc  ?  ?HLD - scheduled IV hydralazine while unable to take PO meds  ?HTN ?Graves disease  ? LOS: 3 days  ? ? ?Norm Parcel, PA-C ?[facility] ?02/09/2022, 9:03 AM ?Please see Amion for pager number during day hours 7:00am-4:30pm ? ?

## 2022-02-09 NOTE — Plan of Care (Signed)
  Problem: Education: Goal: Knowledge of General Education information will improve Description Including pain rating scale, medication(s)/side effects and non-pharmacologic comfort measures Outcome: Progressing   Problem: Health Behavior/Discharge Planning: Goal: Ability to manage health-related needs will improve Outcome: Progressing   

## 2022-02-10 LAB — BASIC METABOLIC PANEL
Anion gap: 9 (ref 5–15)
BUN: 12 mg/dL (ref 8–23)
CO2: 18 mmol/L — ABNORMAL LOW (ref 22–32)
Calcium: 7.9 mg/dL — ABNORMAL LOW (ref 8.9–10.3)
Chloride: 102 mmol/L (ref 98–111)
Creatinine, Ser: 0.87 mg/dL (ref 0.44–1.00)
GFR, Estimated: >60 mL/min (ref >60)
Glucose, Bld: 101 mg/dL — ABNORMAL HIGH (ref 70–99)
Potassium: 3.4 mmol/L — ABNORMAL LOW (ref 3.5–5.1)
Sodium: 129 mmol/L — ABNORMAL LOW (ref 135–145)

## 2022-02-10 LAB — CBC
HCT: 28.7 % — ABNORMAL LOW (ref 36.0–46.0)
Hemoglobin: 10 g/dL — ABNORMAL LOW (ref 12.0–15.0)
MCH: 32.5 pg (ref 26.0–34.0)
MCHC: 34.8 g/dL (ref 30.0–36.0)
MCV: 93.2 fL (ref 80.0–100.0)
Platelets: 179 10*3/uL (ref 150–400)
RBC: 3.08 MIL/uL — ABNORMAL LOW (ref 3.87–5.11)
RDW: 13.5 % (ref 11.5–15.5)
WBC: 7.9 10*3/uL (ref 4.0–10.5)
nRBC: 0 % (ref 0.0–0.2)

## 2022-02-10 LAB — HEPATIC FUNCTION PANEL
ALT: 21 U/L (ref 0–44)
AST: 22 U/L (ref 15–41)
Albumin: 2.2 g/dL — ABNORMAL LOW (ref 3.5–5.0)
Alkaline Phosphatase: 55 U/L (ref 38–126)
Bilirubin, Direct: 0.2 mg/dL (ref 0.0–0.2)
Indirect Bilirubin: 0.7 mg/dL (ref 0.3–0.9)
Total Bilirubin: 0.9 mg/dL (ref 0.3–1.2)
Total Protein: 4.8 g/dL — ABNORMAL LOW (ref 6.5–8.1)

## 2022-02-10 MED ORDER — DOCUSATE SODIUM 100 MG PO CAPS
100.0000 mg | ORAL_CAPSULE | Freq: Two times a day (BID) | ORAL | Status: DC
  Administered 2022-02-10 – 2022-02-13 (×7): 100 mg via ORAL
  Filled 2022-02-10 (×7): qty 1

## 2022-02-10 MED ORDER — VANCOMYCIN HCL 1250 MG/250ML IV SOLN
1250.0000 mg | INTRAVENOUS | Status: AC
  Administered 2022-02-10: 1250 mg via INTRAVENOUS
  Filled 2022-02-10: qty 250

## 2022-02-10 MED ORDER — ACETAMINOPHEN 325 MG PO TABS
650.0000 mg | ORAL_TABLET | Freq: Four times a day (QID) | ORAL | Status: DC
  Administered 2022-02-10 – 2022-02-12 (×8): 650 mg via ORAL
  Filled 2022-02-10 (×10): qty 2

## 2022-02-10 MED ORDER — TRAMADOL HCL 50 MG PO TABS: 50.0000 mg | ORAL_TABLET | Freq: Four times a day (QID) | ORAL | Status: DC | PRN

## 2022-02-10 MED ORDER — POTASSIUM CHLORIDE CRYS ER 20 MEQ PO TBCR
40.0000 meq | EXTENDED_RELEASE_TABLET | Freq: Two times a day (BID) | ORAL | Status: AC
  Administered 2022-02-10 (×2): 40 meq via ORAL
  Filled 2022-02-10 (×2): qty 2

## 2022-02-10 MED ORDER — METHOCARBAMOL 500 MG PO TABS
500.0000 mg | ORAL_TABLET | Freq: Three times a day (TID) | ORAL | Status: DC
Start: 2022-02-10 — End: 2022-02-13
  Administered 2022-02-10 – 2022-02-13 (×10): 500 mg via ORAL
  Filled 2022-02-10 (×10): qty 1

## 2022-02-10 MED ORDER — POLYETHYLENE GLYCOL 3350 17 G PO PACK: 17.0000 g | PACK | Freq: Every day | ORAL | Status: DC | PRN

## 2022-02-10 MED ORDER — HYDRALAZINE HCL 20 MG/ML IJ SOLN
10.0000 mg | Freq: Two times a day (BID) | INTRAMUSCULAR | Status: DC
  Administered 2022-02-10 – 2022-02-13 (×6): 10 mg via INTRAVENOUS
  Filled 2022-02-10 (×7): qty 1

## 2022-02-10 MED ORDER — BISACODYL 10 MG RE SUPP
10.0000 mg | Freq: Once | RECTAL | Status: AC
  Administered 2022-02-10: 10 mg via RECTAL
  Filled 2022-02-10: qty 1

## 2022-02-10 MED ORDER — BOOST / RESOURCE BREEZE PO LIQD CUSTOM
1.0000 | Freq: Three times a day (TID) | ORAL | Status: DC
  Administered 2022-02-10 – 2022-02-11 (×4): 1 via ORAL

## 2022-02-10 MED ORDER — MELATONIN 5 MG PO TABS
15.0000 mg | ORAL_TABLET | Freq: Every day | ORAL | Status: DC
Start: 2022-02-10 — End: 2022-02-13
  Administered 2022-02-10 – 2022-02-12 (×3): 15 mg via ORAL
  Filled 2022-02-10 (×4): qty 3

## 2022-02-10 MED ORDER — LEVOFLOXACIN IN D5W 750 MG/150ML IV SOLN
750.0000 mg | INTRAVENOUS | Status: AC
  Administered 2022-02-10: 750 mg via INTRAVENOUS
  Filled 2022-02-10: qty 150

## 2022-02-10 NOTE — Discharge Instructions (Addendum)
[facility]      [facility] [phone]  OPEN ABDOMINAL SURGERY: POST OP INSTRUCTIONS  Always review your discharge instruction sheet given to you by the facility where your surgery was performed.  IF YOU HAVE DISABILITY OR FAMILY LEAVE FORMS, YOU MUST BRING THEM TO THE OFFICE FOR PROCESSING.  PLEASE DO NOT GIVE THEM TO YOUR DOCTOR.  A prescription for pain medication may be given to you upon discharge.  Take your pain medication as prescribed, if needed.  If narcotic pain medicine is not needed, then you may take acetaminophen (Tylenol) or ibuprofen (Advil) as needed. Take your usually prescribed medications unless otherwise directed. If you need a refill on your pain medication, please contact your pharmacy. They will contact our office to request authorization.  Prescriptions will not be filled after 5pm or on week-ends. You should follow a light diet the first few days after arrival home, such as soup and crackers, pudding, etc.unless your doctor has advised otherwise. A high-fiber, low fat diet can be resumed as tolerated.   Be sure to include lots of fluids daily. Most patients will experience some swelling and bruising on the chest and neck area.  Ice packs will help.  Swelling and bruising can take several days to resolve Most patients will experience some swelling and bruising in the area of the incision. Ice pack will help. Swelling and bruising can take several days to resolve..  It is common to experience some constipation if taking pain medication after surgery.  Increasing fluid intake and taking a stool softener will usually help or prevent this problem from occurring.  A mild laxative (Milk of Magnesia or Miralax) should be taken according to package directions if there are no bowel movements after 48 hours.  You may have steri-strips (small skin tapes) in place directly over the incision.  These strips should be left on the skin for 7-10 days.  If your surgeon used skin  glue on the incision, you may shower in 24 hours.  The glue will flake off over the next 2-3 weeks.  Any sutures or staples will be removed at the office during your follow-up visit. You may find that a light gauze bandage over your incision may keep your staples from being rubbed or pulled. You may shower and replace the bandage daily. ACTIVITIES:  You may resume regular (light) daily activities beginning the next day--such as daily self-care, walking, climbing stairs--gradually increasing activities as tolerated.  You may have sexual intercourse when it is comfortable.  Refrain from any heavy lifting or straining until approved by your doctor. You may drive when you no longer are taking prescription pain medication, you can comfortably wear a seatbelt, and you can safely maneuver your car and apply brakes Return to Work: ___________________________________ You should see your doctor in the office for a follow-up appointment approximately two weeks after your surgery.  Make sure that you call for this appointment within a day or two after you arrive home to insure a convenient appointment time. OTHER INSTRUCTIONS:  _____________________________________________________________ _____________________________________________________________  WHEN TO CALL YOUR DOCTOR: Fever over 101.0 Inability to urinate Nausea and/or vomiting Extreme swelling or bruising Continued bleeding from incision. Increased pain, redness, or drainage from the incision. Difficulty swallowing or breathing Muscle cramping or spasms. Numbness or tingling in hands or feet or around lips.  The clinic staff is available to answer your questions during regular business hours.  Please don't hesitate to call and ask to speak to one of   the nurses if you have concerns.  For further questions, please visit [email or web address]  

## 2022-02-10 NOTE — Progress Notes (Signed)
Pt had 2 formed bowel movements this pm.  ?

## 2022-02-10 NOTE — Progress Notes (Signed)
Mobility Specialist Progress Note  ? ? 02/10/22 1352  ?Mobility  ?Activity Contraindicated/medical hold  ? ?Pt stated she would like to stay close to the [facility] ? ?Hildred Alamin ?Mobility Specialist  ?[address]: [phone]  ?

## 2022-02-10 NOTE — Progress Notes (Signed)
Pharmacy Antibiotic Note ? ?Caitlin Bowers is a 71 y.o. female admitted on 02/05/2022 with intra-abdominal infection .  Pharmacy has been consulted for vancomycin dosing.  Patient is also on Levaquin.  Urine culture also grows E.faecalis and Strep agalactiae.  ? ?Renal function continues to improve.  Afebrile, WBC normalized. ? ?Plan: ?Increase vanc to '1250mg'$  IV Q24H x 1 more dose ?Increase Levaquin to '750mg'$  IV Q24H x 1 more dose ?Pharmacy will sign off as today is the last day of therapy.  Thank you for the consult! ? ?Height: '5\' 10"'$  (177.8 cm) ?Weight: 72.6 kg (160 lb) ?IBW/kg (Calculated) : 68.5 ? ?Temp (24hrs), Avg:98.2 ?F (36.8 ?C), Min:98 ?F (36.7 ?C), Max:98.4 ?F (36.9 ?C) ? ?Recent Labs  ?Lab 02/05/22 ?2245 02/06/22 ?9528 02/07/22 ?0118 02/08/22 ?0201 02/09/22 ?0309 02/10/22 ?0155  ?WBC  --  15.0* 14.6* 11.9* 9.0 7.9  ?CREATININE  --  2.70*  2.72* 2.14* 1.30* 1.10* 0.87  ?LATICACIDVEN 1.5 1.3  --   --   --   --   ? ?  ?Estimated Creatinine Clearance: 63.2 mL/min (by C-G formula based on SCr of 0.87 mg/dL).   ? ?Allergies  ?Allergen Reactions  ? Oxybutynin   ?  Dry mouth; no taste; not effective for incontinence  ? Penicillins Hives  ? Sulfa Antibiotics Hives  ? ? ?Vanc 3/5 >> 3/10 ?LVQ 3/5 >> 3/10 ? ?3/5 BCx - NGTD ?3/5 UCx - 70K E.faecalis (pan sensitive) and 100K Strep agalactiae  ? ?Madisan Bice D. Mina Marble, PharmD, BCPS, BCCCP ?02/10/2022, 8:26 AM ? ?

## 2022-02-10 NOTE — Progress Notes (Signed)
OT Cancellation Note ? ?Patient Details ?Name: Babbie Dondlinger Rhyner ?MRN: [id number] ?DOB: 1951-05-23 ? ? ?Cancelled Treatment:    Reason Eval/Treat Not Completed: Medical issues which prohibited therapy (Pt politely declined, having frequent diarrhea.) ? ?Malka So ?02/10/2022, 2:07 PM ?Nestor Lewandowsky, OTR/L ?Acute Rehabilitation Services ?Pager: [phone] ?Office: [phone]  ?

## 2022-02-10 NOTE — Progress Notes (Signed)
Physical Therapy Treatment ?Patient Details ?Name: Caitlin Bowers ?MRN: [id number] ?DOB: 09-09-1951 ?Today's Date: 02/10/2022 ? ? ?History of Present Illness 71 yo female presents to [facility] on 3/5 with abdominal pain, n/v/d. s/p ex lap, proctoscopy, appendectomy on 3/6. NGT placed in radiology 02/08/22. PMH includes Graves' disease, hypertension, hyperlipidemia, OA, cataracts extraction 2021. ? ?  ?PT Comments  ? ? Pt. Presents sitting EOB with feces on the floor, chair, and herself. Focus of session today was standing balance and functional transfers while pt was assisted in cleaning. The patient tolerated well and shows overall improvement in functional mobility and balance. Pain and medical complications (N/V/D) are still limiting function. Pt. Would benefit from skilled PT to continue to address functional mobility, gait tolerance, and higher level balance. Pt. And family member were educated on use of bedside commode and it was placed close to bed for ease of use, as well as briefs donned in standing. Plan and discharge setting remains unchanged. Pt to follow acutely as appropriate.  ?   ?Recommendations for follow up therapy are one component of a multi-disciplinary discharge planning process, led by the attending physician.  Recommendations may be updated based on patient status, additional functional criteria and insurance authorization. ? ?Follow Up Recommendations ? No PT follow up ?  ?  ?Assistance Recommended at Discharge PRN  ?Patient can return home with the following A little help with walking and/or transfers;A little help with bathing/dressing/bathroom;Assist for transportation ?  ?Equipment Recommendations ? None recommended by PT  ?  ?Recommendations for Other Services   ? ? ?  ?Precautions / Restrictions Precautions ?Precaution Comments: JP drain ?Restrictions ?Weight Bearing Restrictions: No  ?  ? ?Mobility ? Bed Mobility ?Overal bed mobility: Modified Independent ?Bed Mobility: Sit to Supine ?   ?  ?  ?Sit to supine: Modified independent (Device/Increase time) ?  ?General bed mobility comments: Line management ?  ? ?Transfers ?Overall transfer level: Needs assistance ?  ?Transfers: Sit to/from Stand ?Sit to Stand: Supervision ?  ?  ?  ?  ?  ?General transfer comment: Pt. able to perfrom STS with supervision for line management ?  ? ?Ambulation/Gait ?  ?Gait Distance (Feet): 10 Feet ?  ?Gait Pattern/deviations: WFL(Within Functional Limits), Trunk flexed ?  ?  ?  ?General Gait Details: Pt. able to trasnfer back and forth from chair with min difficutly. Trunk flexed for comfort. ? ? ?Stairs ?  ?  ?  ?  ?  ? ? ?Wheelchair Mobility ?  ? ?Modified Rankin (Stroke Patients Only) ?  ? ? ?  ?Balance Overall balance assessment: Modified Independent ?Sitting-balance support: No upper extremity supported ?Sitting balance-Leahy Scale: Good ?  ?  ?Standing balance support: No upper extremity supported ?Standing balance-Leahy Scale: Good ?Standing balance comment: No reported LOB during standing gown change, pt. shows good control of balance throughout all trasnfers ?  ?  ?  ?  ?  ?  ?  ?  ?  ?  ?  ?  ? ?  ?Cognition Arousal/Alertness: Awake/alert ?Behavior During Therapy: [facility] for tasks assessed/performed ?Overall Cognitive Status: Within Functional Limits for tasks assessed ?  ?  ?  ?  ?  ?  ?  ?  ?  ?  ?  ?  ?  ?  ?  ?  ?  ?  ?  ? ?  ?Exercises   ? ?  ?General Comments General comments (skin integrity, edema, etc.): Good overall balance while pt. was  cleaned up and gown changed. ?  ?  ? ?Pertinent Vitals/Pain Pain Assessment ?Pain Assessment: Faces ?Faces Pain Scale: Hurts a little bit ?Pain Location: abdomen ?Pain Descriptors / Indicators: Aching, Guarding, Tender ?Pain Intervention(s): Limited activity within patient's tolerance, Monitored during session, Repositioned  ? ? ?Home Living   ?  ?  ?  ?  ?  ?  ?  ?  ?  ?   ?  ?Prior Function    ?  ?  ?   ? ?PT Goals (current goals can now be found in the care plan  section) Acute Rehab PT Goals ?Patient Stated Goal: Increase movement capacity to help return home ?PT Goal Formulation: With patient/family ?Time For Goal Achievement: 02/21/22 ?Potential to Achieve Goals: Good ?Progress towards PT goals: Progressing toward goals ? ?  ?Frequency ? ? ? Min 3X/week ? ? ? ?  ?PT Plan Current plan remains appropriate  ? ? ?Co-evaluation   ?  ?  ?  ?  ? ?  ?AM-PAC PT "6 Clicks" Mobility   ?Outcome Measure ? Help needed turning from your back to your side while in a flat bed without using bedrails?: None ?Help needed moving from lying on your back to sitting on the side of a flat bed without using bedrails?: None ?Help needed moving to and from a bed to a chair (including a wheelchair)?: None ?Help needed standing up from a chair using your arms (e.g., wheelchair or bedside chair)?: None ?Help needed to walk in hospital room?: A Little ?Help needed climbing 3-5 steps with a railing? : A Little ?6 Click Score: 22 ? ?  ?End of Session   ?Activity Tolerance: Patient tolerated treatment well ?Patient left: in bed;with call bell/phone within reach;with family/visitor present ?Nurse Communication: Mobility status;Other (comment) (Pt. requests bath) ?PT Visit Diagnosis: Unsteadiness on feet (R26.81);Muscle weakness (generalized) (M62.81);Pain ?Pain - Right/Left:  (B) ?Pain - part of body:  (abdomen) ?  ? ? ?Time: 8299-3716 ?PT Time Calculation (min) (ACUTE ONLY): 24 min ? ?Charges:  $Therapeutic Activity: 8-22 mins ?$Self Care/Home Management: 8-22          ?          ? ?Thermon Leyland, SPT ?Acute Rehab Services ? ? ? ?Thermon Leyland ?02/10/2022, 2:34 PM ? ?

## 2022-02-10 NOTE — Progress Notes (Addendum)
? ?Progress Note ? ?5 Days Post-Op  ?Subjective: ?Patient passing flatus and tolerated NGT clamping well yesterday. Does not appear she was reconnected to LIWS overnight. She feels like she needs to have a BM but can't quite, willing to try suppository today. Pain well controlled. She was able to sleep overnight.  ? ?Objective: ?Vital signs in last 24 hours: ?Temp:  [98 ?F (36.7 ?C)-98.4 ?F (36.9 ?C)] 98.2 ?F (36.8 ?C) (03/10 0724) ?Pulse Rate:  [72-81] 78 (03/10 0724) ?Resp:  [17-18] 17 (03/10 0724) ?BP: (140-171)/(72-79) 157/79 (03/10 0724) ?SpO2:  [95 %-98 %] 97 % (03/10 0724) ?Last BM Date : 02/04/22 ? ?Intake/Output from previous day: ?03/09 0701 - 03/10 0700 ?In: 1914 [I.V.:998.8; IV Piggyback:915.2] ?Out: 2105 [Urine:1200; Emesis/NG output:700; Drains:205] ?Intake/Output this shift: ?Total I/O ?In: -  ?Out: 300 [Urine:300] ? ?PE: ?General: pleasant, WD, [name] who is laying in bed in NAD ?Heart: regular, rate, and rhythm.  ?Lungs:  Respiratory effort nonlabored ?Abd: soft, non-tender, mild distention, +BS, incision with dermabond and penrose inferiorly, drain in RLQ with SS fluid, NGT clamped ?Psych: A&Ox3 with an appropriate affect.  ? ? ?Lab Results:  ?Recent Labs  ?  02/09/22 ?0309 02/10/22 ?0155  ?WBC 9.0 7.9  ?HGB 10.1* 10.0*  ?HCT 29.4* 28.7*  ?PLT 183 179  ? ? ?BMET ?Recent Labs  ?  02/09/22 ?0309 02/10/22 ?0155  ?NA 133* 129*  ?K 3.3* 3.4*  ?CL 105 102  ?CO2 19* 18*  ?GLUCOSE 96 101*  ?BUN 15 12  ?CREATININE 1.10* 0.87  ?CALCIUM 8.4* 7.9*  ? ? ?PT/INR ?No results for input(s): LABPROT, INR in the last 72 hours. ?CMP  ?   ?Component Value Date/Time  ? NA 129 (L) 02/10/2022 0155  ? K 3.4 (L) 02/10/2022 0155  ? CL 102 02/10/2022 0155  ? CO2 18 (L) 02/10/2022 0155  ? GLUCOSE 101 (H) 02/10/2022 0155  ? BUN 12 02/10/2022 0155  ? CREATININE 0.87 02/10/2022 0155  ? CALCIUM 7.9 (L) 02/10/2022 0155  ? PROT 4.8 (L) 02/10/2022 0155  ? ALBUMIN 2.2 (L) 02/10/2022 0155  ? AST 22 02/10/2022 0155  ? ALT 21  02/10/2022 0155  ? ALKPHOS 55 02/10/2022 0155  ? BILITOT 0.9 02/10/2022 0155  ? GFRNONAA >60 02/10/2022 0155  ? ?Lipase  ?   ?Component Value Date/Time  ? LIPASE <10 (L) 02/05/2022 1957  ? ? ? ? ? ?Studies/Results: ?DG Abd 1 View ? ?Result Date: 02/08/2022 ?CLINICAL DATA:  Nasogastric placement EXAM: ABDOMEN - 1 VIEW COMPARISON:  02/07/2022 FINDINGS: Nasogastric tube extends below the diaphragm and has its tip in the expected location of the fundus of the stomach. Mild gaseous distension of the small bowel is partially visualized. IMPRESSION: Nasogastric tube tip in the fundus of the stomach. Electronically Signed   By: Nelson Chimes M.D.   On: 02/08/2022 08:21   ? ?Anti-infectives: ?Anti-infectives (From admission, onward)  ? ? Start     Dose/Rate Route Frequency Ordered Stop  ? 02/08/22 2200  vancomycin (VANCOCIN) IVPB 1000 mg/200 mL premix       ? 1,000 mg ?200 mL/hr over 60 Minutes Intravenous Every 24 hours 02/08/22 0950    ? 02/06/22 2200  vancomycin (VANCOREADY) IVPB 750 mg/150 mL  Status:  Discontinued       ? 750 mg ?150 mL/hr over 60 Minutes Intravenous Every 24 hours 02/05/22 2308 02/08/22 0950  ? 02/05/22 2300  levofloxacin (LEVAQUIN) IVPB 750 mg       ? 750 mg ?  100 mL/hr over 90 Minutes Intravenous Every 48 hours 02/05/22 2248    ? 02/05/22 2300  vancomycin (VANCOCIN) IVPB 1000 mg/200 mL premix       ? 1,000 mg ?200 mL/hr over 60 Minutes Intravenous  Once 02/05/22 2249 02/05/22 2357  ? ?  ? ? ? ?Assessment/Plan ? Pneumoperitoneum with pelvic abscess ?POD5 s/p Exploratory laparotomy, proctoscopy, appendectomy 02/06/22 Dr. Thermon Leyland ?- tolerated clamping yesterday, DC NGT today and advance to CLD ?- continue to ambulate, PT/OT ?- WBC 7.9, afebrile, abx to finish today  ?- drain SS, continue for now  ?- will remove penrose drain prior to DC ?- start transition to PO pain control  ?  ?FEN: CLD, decrease IVF to 50cc/h, NGT out today   ?VTE: LMWH ?ID: levaquin and vanc  ?  ?HLD - scheduled IV hydralazine,  likely reorder home meds tomorrow if tolerating CLD ?HTN ?Graves disease  ?UTI - UCx from 3/5 with strep agalactiae and enterococcus, completed appropriate abx ? LOS: 4 days  ? ? ?Norm Parcel, PA-C ?[facility] ?02/10/2022, 7:52 AM ?Please see Amion for pager number during day hours 7:00am-4:30pm ? ?

## 2022-02-11 LAB — CBC
HCT: 29.5 % — ABNORMAL LOW (ref 36.0–46.0)
Hemoglobin: 9.7 g/dL — ABNORMAL LOW (ref 12.0–15.0)
MCH: 31.3 pg (ref 26.0–34.0)
MCHC: 32.9 g/dL (ref 30.0–36.0)
MCV: 95.2 fL (ref 80.0–100.0)
Platelets: 233 10*3/uL (ref 150–400)
RBC: 3.1 MIL/uL — ABNORMAL LOW (ref 3.87–5.11)
RDW: 13.9 % (ref 11.5–15.5)
WBC: 8.3 10*3/uL (ref 4.0–10.5)
nRBC: 0 % (ref 0.0–0.2)

## 2022-02-11 LAB — CULTURE, BLOOD (ROUTINE X 2)
Culture: NO GROWTH
Culture: NO GROWTH
Special Requests: ADEQUATE
Special Requests: ADEQUATE

## 2022-02-11 LAB — BASIC METABOLIC PANEL
Anion gap: 8 (ref 5–15)
BUN: 10 mg/dL (ref 8–23)
CO2: 18 mmol/L — ABNORMAL LOW (ref 22–32)
Calcium: 8.3 mg/dL — ABNORMAL LOW (ref 8.9–10.3)
Chloride: 105 mmol/L (ref 98–111)
Creatinine, Ser: 0.96 mg/dL (ref 0.44–1.00)
GFR, Estimated: >60 mL/min (ref >60)
Glucose, Bld: 116 mg/dL — ABNORMAL HIGH (ref 70–99)
Potassium: 4.2 mmol/L (ref 3.5–5.1)
Sodium: 131 mmol/L — ABNORMAL LOW (ref 135–145)

## 2022-02-11 NOTE — Progress Notes (Signed)
Patient ID: Caitlin Bowers, female   DOB: 11-26-1951, 71 y.o.   MRN: [id number] ?[facility] Progress Note:   6 Days Post-Op  ?Subjective: ?Mental status is clear and appropriate.  Complaints ready for more than clear liquids. ?Objective: ?Vital signs in last 24 hours: ?Temp:  [98.1 ?F (36.7 ?C)-99.5 ?F (37.5 ?C)] 98.3 ?F (36.8 ?C) (03/11 8338) ?Pulse Rate:  [85-91] 85 (03/11 0512) ?Resp:  [17-19] 19 (03/11 0512) ?BP: (123-139)/(54-65) 139/65 (03/11 0512) ?SpO2:  [96 %-99 %] 96 % (03/11 0512) ? ?Intake/Output from previous day: ?03/10 0701 - 03/11 0700 ?In: 480 [P.O.:480] ?Out: 880 [Urine:800; Drains:80] ?Intake/Output this shift: ?No intake/output data recorded. ? ?Physical Exam: Work of breathing is normal.  JP is serosanguinous ? ?Lab Results:  ?Results for orders placed or performed during the hospital encounter of 02/05/22 (from the past 48 hour(s))  ?Basic metabolic panel     Status: Abnormal  ? Collection Time: 02/10/22  1:55 AM  ?Result Value Ref Range  ? Sodium 129 (L) 135 - 145 mmol/L  ? Potassium 3.4 (L) 3.5 - 5.1 mmol/L  ? Chloride 102 98 - 111 mmol/L  ? CO2 18 (L) 22 - 32 mmol/L  ? Glucose, Bld 101 (H) 70 - 99 mg/dL  ?  Comment: Glucose reference range applies only to samples taken after fasting for at least 8 hours.  ? BUN 12 8 - 23 mg/dL  ? Creatinine, Ser 0.87 0.44 - 1.00 mg/dL  ? Calcium 7.9 (L) 8.9 - 10.3 mg/dL  ? GFR, Estimated >60 >60 mL/min  ?  Comment: (NOTE) ?Calculated using the CKD-EPI Creatinine Equation (2021) ?  ? Anion gap 9 5 - 15  ?  Comment: Performed at [facility], [place] [address], [place], [place] [address]  ?CBC     Status: Abnormal  ? Collection Time: 02/10/22  1:55 AM  ?Result Value Ref Range  ? WBC 7.9 4.0 - 10.5 K/uL  ? RBC 3.08 (L) 3.87 - 5.11 MIL/uL  ? Hemoglobin 10.0 (L) 12.0 - 15.0 g/dL  ? HCT 28.7 (L) 36.0 - 46.0 %  ? MCV 93.2 80.0 - 100.0 fL  ? MCH 32.5 26.0 - 34.0 pg  ? MCHC 34.8 30.0 - 36.0 g/dL  ? RDW 13.5 11.5 - 15.5 %  ? Platelets 179 150 -  400 K/uL  ? nRBC 0.0 0.0 - 0.2 %  ?  Comment: Performed at [facility], [place] [address], [place], [place] [address]  ?Hepatic function panel     Status: Abnormal  ? Collection Time: 02/10/22  1:55 AM  ?Result Value Ref Range  ? Total Protein 4.8 (L) 6.5 - 8.1 g/dL  ? Albumin 2.2 (L) 3.5 - 5.0 g/dL  ? AST 22 15 - 41 U/L  ? ALT 21 0 - 44 U/L  ? Alkaline Phosphatase 55 38 - 126 U/L  ? Total Bilirubin 0.9 0.3 - 1.2 mg/dL  ? Bilirubin, Direct 0.2 0.0 - 0.2 mg/dL  ? Indirect Bilirubin 0.7 0.3 - 0.9 mg/dL  ?  Comment: Performed at [facility], [place] [address], [place], [place] [address]  ?Basic metabolic panel     Status: Abnormal  ? Collection Time: 02/11/22  1:53 AM  ?Result Value Ref Range  ? Sodium 131 (L) 135 - 145 mmol/L  ? Potassium 4.2 3.5 - 5.1 mmol/L  ? Chloride 105 98 - 111 mmol/L  ? CO2 18 (L) 22 - 32 mmol/L  ? Glucose, Bld  116 (H) 70 - 99 mg/dL  ?  Comment: Glucose reference range applies only to samples taken after fasting for at least 8 hours.  ? BUN 10 8 - 23 mg/dL  ? Creatinine, Ser 0.96 0.44 - 1.00 mg/dL  ? Calcium 8.3 (L) 8.9 - 10.3 mg/dL  ? GFR, Estimated >60 >60 mL/min  ?  Comment: (NOTE) ?Calculated using the CKD-EPI Creatinine Equation (2021) ?  ? Anion gap 8 5 - 15  ?  Comment: Performed at [facility], [place] [address], [place], [place] [address]  ?CBC     Status: Abnormal  ? Collection Time: 02/11/22  1:53 AM  ?Result Value Ref Range  ? WBC 8.3 4.0 - 10.5 K/uL  ? RBC 3.10 (L) 3.87 - 5.11 MIL/uL  ? Hemoglobin 9.7 (L) 12.0 - 15.0 g/dL  ? HCT 29.5 (L) 36.0 - 46.0 %  ? MCV 95.2 80.0 - 100.0 fL  ? MCH 31.3 26.0 - 34.0 pg  ? MCHC 32.9 30.0 - 36.0 g/dL  ? RDW 13.9 11.5 - 15.5 %  ? Platelets 233 150 - 400 K/uL  ? nRBC 0.0 0.0 - 0.2 %  ?  Comment: Performed at [facility], [place] [address], [place], [place] [address]  ? ? ?Radiology/Results: ?No results found. ? ?Anti-infectives: ?Anti-infectives (From admission, onward)  ? ? Start     Dose/Rate Route Frequency Ordered Stop  ?  02/10/22 2300  levofloxacin (LEVAQUIN) IVPB 750 mg       ? 750 mg ?100 mL/hr over 90 Minutes Intravenous Every 24 hours 02/10/22 0832 02/11/22 0030  ? 02/10/22 1800  vancomycin (VANCOREADY) IVPB 1250 mg/250 mL       ? 1,250 mg ?166.7 mL/hr over 90 Minutes Intravenous Every 24 hours 02/10/22 0832 02/10/22 1923  ? 02/08/22 2200  vancomycin (VANCOCIN) IVPB 1000 mg/200 mL premix  Status:  Discontinued       ? 1,000 mg ?200 mL/hr over 60 Minutes Intravenous Every 24 hours 02/08/22 0950 02/10/22 0832  ? 02/06/22 2200  vancomycin (VANCOREADY) IVPB 750 mg/150 mL  Status:  Discontinued       ? 750 mg ?150 mL/hr over 60 Minutes Intravenous Every 24 hours 02/05/22 2308 02/08/22 0950  ? 02/05/22 2300  levofloxacin (LEVAQUIN) IVPB 750 mg  Status:  Discontinued       ? 750 mg ?100 mL/hr over 90 Minutes Intravenous Every 48 hours 02/05/22 2248 02/10/22 0832  ? 02/05/22 2300  vancomycin (VANCOCIN) IVPB 1000 mg/200 mL premix       ? 1,000 mg ?200 mL/hr over 60 Minutes Intravenous  Once 02/05/22 2249 02/05/22 2357  ? ?  ? ? ?Assessment/Plan: ?Problem List: ?Patient Active Problem List  ? Diagnosis Date Noted  ? Status post surgery 02/06/2022  ? Pneumoperitoneum 02/06/2022  ? Urinary urgency 11/05/2019  ? Hyperlipidemia 11/05/2019  ? Hypertension 11/05/2019  ? History of Graves' disease 11/05/2019  ? ? ?Will advance to full liquid diet ?6 Days Post-Op  ? ? LOS: 5 days  ? ?Matt B. Hassell Done, MD, FACS ? ?[facility] ?[phone] to reach the surgeon on call.   ? ?02/11/2022 8:11 AM  ?

## 2022-02-11 NOTE — Progress Notes (Signed)
Mobility Specialist: Progress Note ? ? 02/11/22 1702  ?Mobility  ?Activity Refused mobility  ? ?Pt refused mobility stating she recently ambulated in the hallway. Pt plans to ambulate with her husband later this evening as well after dinner. Will f/u as able.  ? ?Harrell Gave Laverta Harnisch ?Mobility Specialist ?Mobility Specialist [place]: [phone] ?Mobility Specialist [place]: [phone] ? ?

## 2022-02-12 MED ORDER — PANTOPRAZOLE SODIUM 40 MG PO TBEC
40.0000 mg | DELAYED_RELEASE_TABLET | Freq: Every day | ORAL | Status: DC
  Administered 2022-02-13: 40 mg via ORAL
  Filled 2022-02-12: qty 1

## 2022-02-12 NOTE — Progress Notes (Signed)
Mobility Specialist: Progress Note ? ? 02/12/22 1539  ?Mobility  ?Activity Ambulated independently in hallway  ?Level of Assistance Independent  ?Assistive Device  ?(IV pole)  ?Distance Ambulated (ft) 825 ft  ?Activity Response Tolerated well  ?$Mobility charge 1 Mobility  ? ?Received pt in bed having no complaints and agreeable to mobility. Asymptomatic throughout ambulation, returned back to BR w/ call bell in reach and all needs met. Pt's husband present in the room. ? ?Caitlin Bowers ?Mobility Specialist ?Mobility Specialist [place]: [phone] ?Mobility Specialist [place]: [phone] ? ?

## 2022-02-12 NOTE — Progress Notes (Signed)
Patient ID: Caitlin Bowers, female   DOB: 12-10-1950, 71 y.o.   MRN: [id number] ?[facility] Progress Note:   7 Days Post-Op  ?Subjective: ?Mental status is clear and refreshed after her best sleep night.  Complaints none. ?Objective: ?Vital signs in last 24 hours: ?Temp:  [97.9 ?F (36.6 ?C)-98.4 ?F (36.9 ?C)] 98.4 ?F (36.9 ?C) (03/12 2637) ?Pulse Rate:  [79-98] 79 (03/12 0508) ?Resp:  [17-19] 17 (03/12 0508) ?BP: (120-150)/(53-74) 133/74 (03/12 8588) ?SpO2:  [96 %-99 %] 96 % (03/12 0508) ? ?Intake/Output from previous day: ?03/11 0701 - 03/12 0700 ?In: 1242.9 [P.O.:720; I.V.:522.9] ?Out: 66 [Urine:500; Drains:90] ?Intake/Output this shift: ?No intake/output data recorded. ? ?Physical Exam: Work of breathing is normal;  JP serosanguinous.  Nontender.  Wound covered ? ?Lab Results:  ?Results for orders placed or performed during the hospital encounter of 02/05/22 (from the past 48 hour(s))  ?Basic metabolic panel     Status: Abnormal  ? Collection Time: 02/11/22  1:53 AM  ?Result Value Ref Range  ? Sodium 131 (L) 135 - 145 mmol/L  ? Potassium 4.2 3.5 - 5.1 mmol/L  ? Chloride 105 98 - 111 mmol/L  ? CO2 18 (L) 22 - 32 mmol/L  ? Glucose, Bld 116 (H) 70 - 99 mg/dL  ?  Comment: Glucose reference range applies only to samples taken after fasting for at least 8 hours.  ? BUN 10 8 - 23 mg/dL  ? Creatinine, Ser 0.96 0.44 - 1.00 mg/dL  ? Calcium 8.3 (L) 8.9 - 10.3 mg/dL  ? GFR, Estimated >60 >60 mL/min  ?  Comment: (NOTE) ?Calculated using the CKD-EPI Creatinine Equation (2021) ?  ? Anion gap 8 5 - 15  ?  Comment: Performed at [facility], [place] [address] [place], [place] [address]  ?CBC     Status: Abnormal  ? Collection Time: 02/11/22  1:53 AM  ?Result Value Ref Range  ? WBC 8.3 4.0 - 10.5 K/uL  ? RBC 3.10 (L) 3.87 - 5.11 MIL/uL  ? Hemoglobin 9.7 (L) 12.0 - 15.0 g/dL  ? HCT 29.5 (L) 36.0 - 46.0 %  ? MCV 95.2 80.0 - 100.0 fL  ? MCH 31.3 26.0 - 34.0 pg  ? MCHC 32.9 30.0 - 36.0 g/dL  ? RDW 13.9 11.5 -  15.5 %  ? Platelets 233 150 - 400 K/uL  ? nRBC 0.0 0.0 - 0.2 %  ?  Comment: Performed at [facility], [place] [address], [place], [place] [address]  ? ? ?Radiology/Results: ?No results found. ? ?Anti-infectives: ?Anti-infectives (From admission, onward)  ? ? Start     Dose/Rate Route Frequency Ordered Stop  ? 02/10/22 2300  levofloxacin (LEVAQUIN) IVPB 750 mg       ? 750 mg ?100 mL/hr over 90 Minutes Intravenous Every 24 hours 02/10/22 0832 02/11/22 0030  ? 02/10/22 1800  vancomycin (VANCOREADY) IVPB 1250 mg/250 mL       ? 1,250 mg ?166.7 mL/hr over 90 Minutes Intravenous Every 24 hours 02/10/22 0832 02/10/22 1923  ? 02/08/22 2200  vancomycin (VANCOCIN) IVPB 1000 mg/200 mL premix  Status:  Discontinued       ? 1,000 mg ?200 mL/hr over 60 Minutes Intravenous Every 24 hours 02/08/22 0950 02/10/22 0832  ? 02/06/22 2200  vancomycin (VANCOREADY) IVPB 750 mg/150 mL  Status:  Discontinued       ? 750 mg ?150 mL/hr over 60 Minutes Intravenous Every 24 hours 02/05/22 2308 02/08/22 0950  ?  02/05/22 2300  levofloxacin (LEVAQUIN) IVPB 750 mg  Status:  Discontinued       ? 750 mg ?100 mL/hr over 90 Minutes Intravenous Every 48 hours 02/05/22 2248 02/10/22 0832  ? 02/05/22 2300  vancomycin (VANCOCIN) IVPB 1000 mg/200 mL premix       ? 1,000 mg ?200 mL/hr over 60 Minutes Intravenous  Once 02/05/22 2249 02/05/22 2357  ? ?  ? ? ?Assessment/Plan: ?Problem List: ?Patient Active Problem List  ? Diagnosis Date Noted  ? Status post surgery 02/06/2022  ? Pneumoperitoneum 02/06/2022  ? Urinary urgency 11/05/2019  ? Hyperlipidemia 11/05/2019  ? Hypertension 11/05/2019  ? History of Graves' disease 11/05/2019  ? ? ?Will advance to regular diet.  Pull drain tomorrow and discharge if diet tolerated.  ?7 Days Post-Op  ? ? LOS: 6 days  ? ?Matt B. Hassell Done, MD, FACS ? ?[facility] ?[phone] to reach the surgeon on call.   ? ?02/12/2022 8:32 AM  ?

## 2022-02-13 DIAGNOSIS — N739 Female pelvic inflammatory disease, unspecified: Secondary | ICD-10-CM | POA: Diagnosis present

## 2022-02-13 DIAGNOSIS — Z9049 Acquired absence of other specified parts of digestive tract: Secondary | ICD-10-CM

## 2022-02-13 DIAGNOSIS — K631 Perforation of intestine (nontraumatic): Principal | ICD-10-CM | POA: Diagnosis present

## 2022-02-13 MED ORDER — DOCUSATE SODIUM 100 MG PO CAPS: 100.0000 mg | ORAL_CAPSULE | Freq: Two times a day (BID) | ORAL | Status: DC | PRN

## 2022-02-13 MED ORDER — TRAMADOL HCL 50 MG PO TABS: 50.0000 mg | ORAL_TABLET | Freq: Four times a day (QID) | ORAL | 0 refills | Status: AC | PRN

## 2022-02-13 MED ORDER — METHOCARBAMOL 500 MG PO TABS: 500.0000 mg | ORAL_TABLET | Freq: Three times a day (TID) | ORAL | 0 refills | Status: DC | PRN

## 2022-02-13 MED ORDER — ACETAMINOPHEN 325 MG PO TABS
650.0000 mg | ORAL_TABLET | Freq: Four times a day (QID) | ORAL | Status: DC | PRN
Start: 2022-02-13 — End: 2022-02-20

## 2022-02-13 MED ORDER — CYCLOBENZAPRINE HCL 5 MG PO TABS
5.0000 mg | ORAL_TABLET | Freq: Three times a day (TID) | ORAL | 0 refills | Status: AC | PRN
Start: 2022-02-13 — End: 2022-02-18

## 2022-02-13 NOTE — Plan of Care (Signed)
?  Problem: Education: ?Goal: Knowledge of General Education information will improve ?Description: Including pain rating scale, medication(s)/side effects and non-pharmacologic comfort measures ?Outcome: Adequate for Discharge ?  ?Problem: Education: ?Goal: Required Educational Video(s) ?Outcome: Adequate for Discharge ?  ?Problem: Clinical Measurements: ?Goal: Ability to maintain clinical measurements within normal limits will improve ?Outcome: Adequate for Discharge ?Goal: Postoperative complications will be avoided or minimized ?Outcome: Adequate for Discharge ?  ?Problem: Skin Integrity: ?Goal: Demonstration of wound healing without infection will improve ?Outcome: Adequate for Discharge ?  ?Problem: Health Behavior/Discharge Planning: ?Goal: Ability to manage health-related needs will improve ?Outcome: Adequate for Discharge ?  ?Problem: Clinical Measurements: ?Goal: Ability to maintain clinical measurements within normal limits will improve ?Outcome: Adequate for Discharge ?Goal: Will remain free from infection ?Outcome: Adequate for Discharge ?Goal: Diagnostic test results will improve ?Outcome: Adequate for Discharge ?Goal: Respiratory complications will improve ?Outcome: Adequate for Discharge ?Goal: Cardiovascular complication will be avoided ?Outcome: Adequate for Discharge ?  ?Problem: Activity: ?Goal: Risk for activity intolerance will decrease ?Outcome: Adequate for Discharge ?  ?Problem: Nutrition: ?Goal: Adequate nutrition will be maintained ?Outcome: Adequate for Discharge ?  ?Problem: Coping: ?Goal: Level of anxiety will decrease ?Outcome: Adequate for Discharge ?  ?Problem: Elimination: ?Goal: Will not experience complications related to bowel motility ?Outcome: Adequate for Discharge ?Goal: Will not experience complications related to urinary retention ?Outcome: Adequate for Discharge ?  ?Problem: Pain Managment: ?Goal: General experience of comfort will improve ?Outcome: Adequate for Discharge ?   ?Problem: Safety: ?Goal: Ability to remain free from injury will improve ?Outcome: Adequate for Discharge ?  ?Problem: Skin Integrity: ?Goal: Risk for impaired skin integrity will decrease ?Outcome: Adequate for Discharge ?  ?

## 2022-02-13 NOTE — Discharge Summary (Signed)
[facility] ?Discharge Summary  ? ?Patient ID: ?Caitlin Bowers ?MRN: [id number] ?DOB/AGE: 08-18-1951 71 y.o. ? ?Admit date: 02/05/2022 ?Discharge date: 02/13/2022 ? ?Admitting Diagnosis: ?Bowel perforation ([place]) [K63.1] ?Generalized abdominal pain [R10.84] ?Status post surgery [Z98.890] ?Pneumoperitoneum [K66.8] ? ?Discharge Diagnosis ?Pneumoperitoneum with pelvic abscess ?s/p Exploratory laparotomy, proctoscopy, appendectomy ?UTI ? ?Consultants ?Ob/Gyn - Dr. Rip Harbour ? ?Imaging: ?No results found. ? ?Procedures ?Dr. Thermon Leyland (02/06/22) -  Exploratory laparotomy, proctoscopy, appendectomy ? ?Hospital Course:  ?71 yo female who presented to [facility] ED with abdominal pain.  Workup showed free air on CT scan and she was transferred to [facility] perioperative for surgery as above.   ?Intraoperatively OB/gyn consulted to evaluate pelvic abscess with uterine fibroids noted but no definitive gynecologic source of infection/inflammation. Tolerated the procedure well and she was admitted to general surgery service post operatively, transferred to floor. She had an NGT initially post op which was removed but had to be replaced due to ileus on POD3. As bowel function slowly improved this was removed and diet was advanced as tolerated.  She remained on IV antibiotics for 5 days post operatively. Urine culture collected on admission was concerning for infection and she completed appropriate antibiotics. On POD8, the patient was voiding well, tolerating diet, ambulating well, pain well controlled, vital signs stable, and felt stable for discharge home. Her JP drain was serosanguinous and removed prior to discharge, penrose drain removed prior to discharge.She did have some erythema along midline incision and was given instructions to monitor for worsening redness or discharge with concern for possibly needing to open wound further. Return precautions discussed. Patient will follow up in our office in  2 weeks and knows to call with questions or concerns.   ? ?Physical Exam: ?General:  Alert, NAD, pleasant, comfortable ?Abd:  Soft, ND, NT, midline incision C/D/I with dermabond intact, drain with sanguinous drainage, penrose with bloody purulent drainage, no palpable fluid collection along incision line. Mild erythema along portions of midline incision. No induration surrounding midline incision ? ?I or a member of my team have reviewed this patient in the Controlled Substance Database. ? ? ?Allergies as of 02/13/2022   ? ?   Reactions  ? Oxybutynin   ? Dry mouth; no taste; not effective for incontinence  ? Penicillins Hives  ? Sulfa Antibiotics Hives  ? ?  ? ?  ?Medication List  ?  ? ?TAKE these medications   ? ?acetaminophen 325 MG tablet ?Commonly known as: TYLENOL ?Take 2 tablets (650 mg total) by mouth every 6 (six) hours as needed for mild pain or moderate pain. ?  ?atenolol 25 MG tablet ?Commonly known as: TENORMIN ?TAKE 1 TABLET BY MOUTH EVERY DAY ?  ?atorvastatin 40 MG tablet ?Commonly known as: LIPITOR ?TAKE 1 TABLET BY MOUTH EVERYDAY AT BEDTIME ?What changed: See the new instructions. ?  ?CALCIUM 1000 + D PO ?Take 2 tablets by mouth daily. ?  ?Citrucel 500 MG Tabs ?Generic drug: Methylcellulose (Laxative) ?Take 1,000 mg by mouth daily. ?  ?Citrucel oral powder ?Generic drug: methylcellulose ?Take 1 packet by mouth daily. ?  ?[employer]S Sodium Chloride 5 % ophthalmic solution ?Generic drug: sodium chloride ?Place 1 drop into both eyes as needed for eye irritation. ?  ?docusate sodium 100 MG capsule ?Commonly known as: COLACE ?Take 1 capsule (100 mg total) by mouth 2 (two) times daily as needed for mild constipation or moderate constipation. ?  ?estradiol 0.1 MG/GM vaginal cream ?Commonly known as: ESTRACE ?Place 1 Applicatorful  vaginally 3 (three) times a week. ?  ?fluticasone 50 MCG/ACT nasal spray ?Commonly known as: FLONASE ?Place 2 sprays into both nostrils daily. ?  ?GLUCOSAMINE-CHONDROITIN PO ?Take 2  capsules by mouth daily. 750-100//125-1.65 ?  ?MELATONIN PO ?Take 15 mg by mouth at bedtime. ?  ?methocarbamol 500 MG tablet ?Commonly known as: ROBAXIN ?Take 1 tablet (500 mg total) by mouth 3 (three) times daily as needed for up to 7 days for muscle spasms (pain). ?  ?Myrbetriq 50 MG Tb24 tablet ?Generic drug: mirabegron ER ?TAKE 1 TABLET BY MOUTH EVERY DAY ?What changed: how much to take ?  ?oxymetazoline 0.05 % nasal spray ?Commonly known as: AFRIN ?Place 1 spray into both nostrils 2 (two) times daily as needed (congestion during flights). ?  ?PROBIOTIC PO ?Take 1 tablet by mouth daily. ?  ?traMADol 50 MG tablet ?Commonly known as: ULTRAM ?Take 1 tablet (50 mg total) by mouth every 6 (six) hours as needed for up to 5 days for moderate pain or severe pain. ?  ?triamterene-hydrochlorothiazide 37.5-25 MG tablet ?Commonly known as: MAXZIDE-25 ?TAKE 1 TABLET BY MOUTH EVERY DAY ?  ? ?  ? ? ? ? Follow-up Information   ? ? Stechschulte, Nickola Major, MD Follow up on 03/01/2022.   ?Specialty: Surgery ?Why: 140pm. Please arrive 30 minutes prior to your appointment for paperwork. Please bring a copy of your photo ID and insurance card. ?Contact information: ?[address] ?[place] [address] ?[phone] ? ? ?  ?  ? ? [facility] Follow up on 02/21/2022.   ?Specialty: General Surgery ?Why: follow up on 02/21/22 at 1:30 pm for post op follow up and incision check. Please arrive 30 minutes prior to your appointment for paperwork. Please bring a copy of your photo ID and insurance card. ?Contact information: ?[address] ?[address] ?[place] ?[phone] ? ?  ?  ? ?  ?  ? ?  ? ? ?Signed: ?Winferd Humphrey , PA-C ?[facility] ?02/13/2022, 11:26 AM ?Please see Amion for pager number during day hours 7:00am-4:30pm ? ? ? ?

## 2022-02-13 NOTE — Progress Notes (Signed)
OT Cancellation Note ? ?Patient Details ?Name: Natalia Wittmeyer Sagar ?MRN: [id number] ?DOB: [birth year] ? ? ?Cancelled Treatment:    Reason Eval/Treat Not Completed:  (Pt is independent in ADLs and mobility. No further OT needs.) ? ?Malka So ?02/13/2022, 12:38 PM ?Nestor Lewandowsky, OTR/L ?Acute Rehabilitation Services ?Pager: [phone] ?Office: [phone]  ?

## 2022-02-13 NOTE — Progress Notes (Signed)
PT Cancellation Note ? ?Patient Details ?Name: Caitlin Bowers ?MRN: [id number] ?DOB: 23-Jan-1951 ? ? ?Cancelled Treatment:    Reason Eval/Treat Not Completed: PT screened, no needs identified, will sign off - pt reports feeling back to baseline, has been mobilizing all weekend and is ready to d/c home without need for PT session today. PT to sign off, thank you for consult. ? ?Stacie Glaze, PT DPT ?Acute Rehabilitation Services ?Pager [phone]  ?Office [phone] ? ? ? ?Caitlin Bowers ?02/13/2022, 9:51 AM ?

## 2022-02-14 NOTE — Progress Notes (Signed)
?  Progress Note  ? ?Date: 02/13/2022 ? ?Patient Name: Caitlin Bowers        ?MRN#: [id number] ? ?Review the patient?s clinical findings supports the diagnosis of:  ? ?Hyponatremia  ? Managed with IV hydration prior to patient resuming a normal diet ? ? ? ?

## 2022-02-20 ENCOUNTER — Encounter: Payer: Self-pay | Admitting: Family Medicine

## 2022-02-20 ENCOUNTER — Ambulatory Visit (INDEPENDENT_AMBULATORY_CARE_PROVIDER_SITE_OTHER): Payer: Medicare Other | Admitting: Family Medicine

## 2022-02-20 VITALS — BP 102/74 | HR 68 | Temp 97.6°F | Ht 69.0 in | Wt 161.3 lb

## 2022-02-20 DIAGNOSIS — E785 Hyperlipidemia, unspecified: Secondary | ICD-10-CM

## 2022-02-20 DIAGNOSIS — I10 Essential (primary) hypertension: Secondary | ICD-10-CM

## 2022-02-20 DIAGNOSIS — D259 Leiomyoma of uterus, unspecified: Secondary | ICD-10-CM

## 2022-02-20 DIAGNOSIS — R3915 Urgency of urination: Secondary | ICD-10-CM

## 2022-02-20 LAB — URINALYSIS
Bilirubin Urine: NEGATIVE
Ketones, ur: NEGATIVE
Leukocytes,Ua: NEGATIVE
Nitrite: NEGATIVE
Specific Gravity, Urine: 1.01 (ref 1.000–1.030)
Total Protein, Urine: NEGATIVE
Urine Glucose: NEGATIVE
Urobilinogen, UA: 0.2 (ref 0.0–1.0)
pH: 8 (ref 5.0–8.0)

## 2022-02-20 LAB — CBC WITH DIFFERENTIAL/PLATELET
Basophils Absolute: 0.1 10*3/uL (ref 0.0–0.1)
Basophils Relative: 1.2 % (ref 0.0–3.0)
Eosinophils Absolute: 0.1 10*3/uL (ref 0.0–0.7)
Eosinophils Relative: 1.4 % (ref 0.0–5.0)
HCT: 33.6 % — ABNORMAL LOW (ref 36.0–46.0)
Hemoglobin: 11.4 g/dL — ABNORMAL LOW (ref 12.0–15.0)
Lymphocytes Relative: 23.4 % (ref 12.0–46.0)
Lymphs Abs: 2.2 10*3/uL (ref 0.7–4.0)
MCHC: 34 g/dL (ref 30.0–36.0)
MCV: 95 fl (ref 78.0–100.0)
Monocytes Absolute: 0.9 10*3/uL (ref 0.1–1.0)
Monocytes Relative: 9.9 % (ref 3.0–12.0)
Neutro Abs: 6 10*3/uL (ref 1.4–7.7)
Neutrophils Relative %: 64.1 % (ref 43.0–77.0)
Platelets: 564 10*3/uL — ABNORMAL HIGH (ref 150.0–400.0)
RBC: 3.54 Mil/uL — ABNORMAL LOW (ref 3.87–5.11)
RDW: 13.7 % (ref 11.5–15.5)
WBC: 9.3 10*3/uL (ref 4.0–10.5)

## 2022-02-20 LAB — LIPID PANEL
Cholesterol: 163 mg/dL (ref 0–200)
HDL: 34.8 mg/dL — ABNORMAL LOW (ref >39.00)
NonHDL: 127.78
Total CHOL/HDL Ratio: 5
Triglycerides: 287 mg/dL — ABNORMAL HIGH (ref 0.0–149.0)
VLDL: 57.4 mg/dL — ABNORMAL HIGH (ref 0.0–40.0)

## 2022-02-20 LAB — COMPREHENSIVE METABOLIC PANEL
ALT: 24 U/L (ref 0–35)
AST: 27 U/L (ref 0–37)
Albumin: 4 g/dL (ref 3.5–5.2)
Alkaline Phosphatase: 102 U/L (ref 39–117)
BUN: 17 mg/dL (ref 6–23)
CO2: 26 mEq/L (ref 19–32)
Calcium: 9.9 mg/dL (ref 8.4–10.5)
Chloride: 96 mEq/L (ref 96–112)
Creatinine, Ser: 1 mg/dL (ref 0.40–1.20)
GFR: 56.22 mL/min — ABNORMAL LOW (ref >60.00)
Glucose, Bld: 86 mg/dL (ref 70–99)
Potassium: 4.3 mEq/L (ref 3.5–5.1)
Sodium: 133 mEq/L — ABNORMAL LOW (ref 135–145)
Total Bilirubin: 0.5 mg/dL (ref 0.2–1.2)
Total Protein: 7.1 g/dL (ref 6.0–8.3)

## 2022-02-20 LAB — LDL CHOLESTEROL, DIRECT: Direct LDL: 80 mg/dL

## 2022-02-20 NOTE — Patient Instructions (Signed)
Arlina Robes, MD, FACOG ?Attending Obstetrician & Gynecologist ?[facility], [facility] ?Consult Phone: [phone] ?

## 2022-02-20 NOTE — Progress Notes (Signed)
Caitlin Bowers DOB: [birth year] Encounter date: 02/20/2022  This is a 71 y.o. female who presents for chronic condition visit/exam  History of present illness/Additional concerns: Patient was admitted on 02/05/2022 to the hospital and discharged on 02/13/2022.  Admitting diagnoses were bowel perforation, pneumoperitoneum with pelvic abscess.  She underwent exploratory laparotomy proctoscopy, and appendectomy.  She has a follow-up tomorrow with a Development worker, international aid.  She was in [place] when she started having sx and barely made it back here to go straight to ER. She no longer has pain, incision is doing well. Energy is down. She was NPO for most of stay. Breathing feels ok.no fevers. Urinating ok. Regular bowel movements.   Eating normally now. Very hungry.   HTN: bp was issue whole time in hospital - lots of fluctuations. Has stayed pretty stable at home. Still on atenolol, maxzide.   Yesterday was first day out - first time driving. Hasn't been back to walking/exercising yet. Walking around in house. Does feel like hse is getting stronger.  HL: lipitor 40mg  - doing well with this.  Urinary freq: myrbetriq does feel like it works well. Has a little urge,but improving. Treated since coming home with abx. Took last one this morning (macrobid)  Mammogram also tomorrow.  Repeat colonoscopy 2029.    Past Surgical History:  Procedure Laterality Date   CATARACT EXTRACTION, BILATERAL Bilateral 02/2020   CESAREAN SECTION  1979   COLONOSCOPY  2017   UTSW, tics, 2 polyps, TA x 1, 5 yr recall   LAPAROTOMY N/A 02/05/2022   Procedure: EXPLORATORY LAPAROTOMY PROCTOSIGMOIDOSCOPY APPENDECTOMY;  Surgeon: Quentin Ore, MD;  Location: [facility] OR;  Service: General;  Laterality: N/A;   POLYPECTOMY  2017   TA x 1   TONSILLECTOMY  1957   Allergies  Allergen Reactions   Oxybutynin     Dry mouth; no taste; not effective for incontinence   Penicillins Hives   Sulfa Antibiotics Hives   Current Meds   Medication Sig   atenolol (TENORMIN) 25 MG tablet TAKE 1 TABLET BY MOUTH EVERY DAY (Patient taking differently: Take 25 mg by mouth daily.)   atorvastatin (LIPITOR) 40 MG tablet TAKE 1 TABLET BY MOUTH EVERYDAY AT BEDTIME (Patient taking differently: Take 40 mg by mouth daily.)   Calcium Carb-Cholecalciferol (CALCIUM 1000 + D PO) Take 2 tablets by mouth daily.   CVS SODIUM CHLORIDE 5 % ophthalmic solution Place 1 drop into both eyes as needed for eye irritation.   estradiol (ESTRACE) 0.1 MG/GM vaginal cream Place 1 Applicatorful vaginally 3 (three) times a week.   fluticasone (FLONASE) 50 MCG/ACT nasal spray Place 2 sprays into both nostrils daily.   GLUCOSAMINE-CHONDROITIN PO Take 2 capsules by mouth daily. 750-100//125-1.65   MELATONIN PO Take 15 mg by mouth at bedtime.   methylcellulose (CITRUCEL) oral powder Take 1 packet by mouth daily.   MYRBETRIQ 50 MG TB24 tablet TAKE 1 TABLET BY MOUTH EVERY DAY (Patient taking differently: Take 50 mg by mouth daily.)   oxymetazoline (AFRIN) 0.05 % nasal spray Place 1 spray into both nostrils 2 (two) times daily as needed (congestion during flights).   Probiotic Product (PROBIOTIC PO) Take 1 tablet by mouth daily.   triamterene-hydrochlorothiazide (MAXZIDE-25) 37.5-25 MG tablet TAKE 1 TABLET BY MOUTH EVERY DAY (Patient taking differently: Take 1 tablet by mouth daily.)   Social History   Tobacco Use   Smoking status: Never   Smokeless tobacco: Never  Substance Use Topics   Alcohol use: Yes  Alcohol/week: 7.0 standard drinks    Types: 7 Shots of liquor per week    Comment: per week   Family History  Adopted: Yes  Family history unknown: Yes     Review of Systems  Constitutional:  Negative for activity change, appetite change, chills, fatigue, fever and unexpected weight change.  HENT:  Negative for congestion, ear pain, hearing loss, sinus pressure, sinus pain, sore throat and trouble swallowing.   Eyes:  Negative for pain and visual  disturbance.  Respiratory:  Negative for cough, chest tightness, shortness of breath and wheezing.   Cardiovascular:  Negative for chest pain, palpitations and leg swelling.  Gastrointestinal:  Negative for abdominal pain, blood in stool, constipation, diarrhea, nausea and vomiting.  Genitourinary:  Negative for difficulty urinating and menstrual problem.  Musculoskeletal:  Negative for arthralgias and back pain.  Skin:  Negative for rash.  Neurological:  Negative for dizziness, weakness, numbness and headaches.  Hematological:  Negative for adenopathy. Does not bruise/bleed easily.  Psychiatric/Behavioral:  Negative for sleep disturbance and suicidal ideas. The patient is not nervous/anxious.    CBC:  Lab Results  Component Value Date   WBC 8.3 02/11/2022   HGB 9.7 (L) 02/11/2022   HCT 29.5 (L) 02/11/2022   MCH 31.3 02/11/2022   MCHC 32.9 02/11/2022   RDW 13.9 02/11/2022   PLT 233 02/11/2022   CMP: Lab Results  Component Value Date   NA 131 (L) 02/11/2022   K 4.2 02/11/2022   CL 105 02/11/2022   CO2 18 (L) 02/11/2022   ANIONGAP 8 02/11/2022   GLUCOSE 116 (H) 02/11/2022   BUN 10 02/11/2022   CREATININE 0.96 02/11/2022   CALCIUM 8.3 (L) 02/11/2022   PROT 4.8 (L) 02/10/2022   BILITOT 0.9 02/10/2022   ALKPHOS 55 02/10/2022   ALT 21 02/10/2022   AST 22 02/10/2022   LIPID: Lab Results  Component Value Date   CHOL 181 08/22/2021   TRIG 143.0 08/22/2021   HDL 61.60 08/22/2021   LDLCALC 91 08/22/2021    Objective:  BP 102/74 (BP Location: Left Arm, Patient Position: Sitting, Cuff Size: Large)   Pulse 68   Temp 97.6 F (36.4 C) (Oral)   Ht 5\' 9"  (1.753 m)   Wt 161 lb 4.8 oz (73.2 kg)   SpO2 98%   BMI 23.82 kg/m   Weight: 161 lb 4.8 oz (73.2 kg)   BP Readings from Last 3 Encounters:  02/20/22 102/74  02/13/22 (!) 158/70  09/07/21 (!) 154/79   Wt Readings from Last 3 Encounters:  02/20/22 161 lb 4.8 oz (73.2 kg)  02/05/22 160 lb (72.6 kg)  12/16/21 163 lb  (73.9 kg)    Physical Exam Constitutional:      General: She is not in acute distress.    Appearance: She is well-developed.  HENT:     Head: Normocephalic and atraumatic.     Right Ear: External ear normal.     Left Ear: External ear normal.     Mouth/Throat:     Pharynx: No oropharyngeal exudate.  Eyes:     Conjunctiva/sclera: Conjunctivae normal.     Pupils: Pupils are equal, round, and reactive to light.  Neck:     Thyroid: No thyromegaly.  Cardiovascular:     Rate and Rhythm: Normal rate and regular rhythm.     Heart sounds: Normal heart sounds. No murmur heard.   No friction rub. No gallop.  Pulmonary:     Effort: Pulmonary effort is normal.  Breath sounds: Normal breath sounds.  Abdominal:     General: Bowel sounds are normal. There is no distension.     Palpations: Abdomen is soft. There is no mass.     Tenderness: There is no abdominal tenderness. There is no guarding.     Hernia: No hernia is present.  Musculoskeletal:        General: No tenderness or deformity. Normal range of motion.     Cervical back: Normal range of motion and neck supple.  Lymphadenopathy:     Cervical: No cervical adenopathy.  Skin:    General: Skin is warm and dry.     Findings: No rash.  Neurological:     Mental Status: She is alert and oriented to person, place, and time.     Deep Tendon Reflexes: Reflexes normal.     Reflex Scores:      Tricep reflexes are 2+ on the right side and 2+ on the left side.      Bicep reflexes are 2+ on the right side and 2+ on the left side.      Brachioradialis reflexes are 2+ on the right side and 2+ on the left side.      Patellar reflexes are 2+ on the right side and 2+ on the left side. Psychiatric:        Speech: Speech normal.        Behavior: Behavior normal.        Thought Content: Thought content normal.    Assessment/Plan: There are no preventive care reminders to display for this patient. Health Maintenance reviewed.  1.  Hypertension, unspecified type Well controlled. Continue with current medication.  - CBC with Differential/Platelet; Future - Comprehensive metabolic panel; Future  2. Hyperlipidemia, unspecified hyperlipidemia type Continue with crestor.  - Comprehensive metabolic panel; Future - Lipid panel; Future  3. Urinary urgency - Urine Culture; Future - Urinalysis; Future  4. Uterine leiomyoma, unspecified location - Ambulatory referral to Gynecology   Return for pending lab or imaging results.  Theodis Shove, MD

## 2022-02-21 ENCOUNTER — Other Ambulatory Visit: Payer: Self-pay

## 2022-02-21 ENCOUNTER — Ambulatory Visit
Admission: RE | Admit: 2022-02-21 | Discharge: 2022-02-21 | Disposition: A | Discharge status: Home / Self Care | Payer: Medicare Other | Source: Ambulatory Visit | Attending: Family Medicine | Admitting: Family Medicine

## 2022-02-21 DIAGNOSIS — Z1231 Encounter for screening mammogram for malignant neoplasm of breast: Secondary | ICD-10-CM

## 2022-02-21 LAB — URINE CULTURE
MICRO NUMBER:: 13152206
Result:: NO GROWTH
SPECIMEN QUALITY:: ADEQUATE

## 2022-04-10 ENCOUNTER — Other Ambulatory Visit: Payer: Self-pay | Admitting: Surgery

## 2022-04-10 DIAGNOSIS — R1032 Left lower quadrant pain: Secondary | ICD-10-CM

## 2022-04-13 ENCOUNTER — Other Ambulatory Visit: Payer: Self-pay | Admitting: Family Medicine

## 2022-04-13 DIAGNOSIS — E785 Hyperlipidemia, unspecified: Secondary | ICD-10-CM

## 2022-04-20 ENCOUNTER — Other Ambulatory Visit: Payer: Self-pay | Admitting: Family Medicine

## 2022-04-20 DIAGNOSIS — I10 Essential (primary) hypertension: Secondary | ICD-10-CM

## 2022-04-27 ENCOUNTER — Encounter (HOSPITAL_BASED_OUTPATIENT_CLINIC_OR_DEPARTMENT_OTHER): Payer: Self-pay | Admitting: Obstetrics & Gynecology

## 2022-04-27 ENCOUNTER — Ambulatory Visit (INDEPENDENT_AMBULATORY_CARE_PROVIDER_SITE_OTHER): Payer: Medicare Other | Admitting: Obstetrics & Gynecology

## 2022-04-27 ENCOUNTER — Other Ambulatory Visit (HOSPITAL_COMMUNITY)
Admission: RE | Admit: 2022-04-27 | Discharge: 2022-04-27 | Disposition: A | Discharge status: Home / Self Care | Payer: Medicare Other | Source: Ambulatory Visit | Attending: Obstetrics & Gynecology | Admitting: Obstetrics & Gynecology

## 2022-04-27 VITALS — BP 138/72 | HR 70 | Ht 70.0 in | Wt 163.6 lb

## 2022-04-27 DIAGNOSIS — D251 Intramural leiomyoma of uterus: Secondary | ICD-10-CM

## 2022-04-27 DIAGNOSIS — Z1151 Encounter for screening for human papillomavirus (HPV): Secondary | ICD-10-CM | POA: Insufficient documentation

## 2022-04-27 DIAGNOSIS — Z87410 Personal history of cervical dysplasia: Secondary | ICD-10-CM

## 2022-04-27 DIAGNOSIS — N739 Female pelvic inflammatory disease, unspecified: Secondary | ICD-10-CM

## 2022-04-27 DIAGNOSIS — Z9889 Other specified postprocedural states: Secondary | ICD-10-CM

## 2022-04-27 DIAGNOSIS — Z124 Encounter for screening for malignant neoplasm of cervix: Secondary | ICD-10-CM | POA: Insufficient documentation

## 2022-04-27 DIAGNOSIS — Z780 Asymptomatic menopausal state: Secondary | ICD-10-CM | POA: Diagnosis not present

## 2022-04-27 NOTE — Progress Notes (Signed)
71 y.o. G1P1 Married White or Caucasian female here for new patient exam.  Has not seen gyn in several years.  Had recent emergency laparotomy with appendectomy due to free air in the abdomen and abscess.  No source of bowel perforation or other source of infection was noted at the time of surgery.  Fibroids noted with imaging done prior to surgery.  Is healing and feeling ok.  Does not love how her abdomen looks since surgery.  Denies vaginal bleeding.  Not on HRT.  Is PMP.  H/o LEEP in 2011.  Not sure about pathology but reports it was done for abnormal cells.  Has not had a pap since 2019.  With LEEP hx and no pathology, feel should follow as if had high grade dysplasia.  Current recommendations regarding this hx discussed.    Health Maintenance: PCP:  Dr. Ethlyn Gallery   Colonoscopy:  09/07/21 MMG:  02/21/22 BMD:  07/16/17 osteopenia Last pap smear:  09/09/18.   H/o abnormal pap smear:      reports that she has never smoked. She has never used smokeless tobacco. She reports current alcohol use of about 7.0 standard drinks per week. She reports that she does not use drugs.  Past Medical History:  Diagnosis Date   Arthritis    RIGHT shoulder, bilateral hands   Cataract    bilateral sx   Graves disease    in remission as of 08/24/2021   Hyperlipidemia    on meds   Hypertension    on meds   Seasonal allergies    Urgency of urination     Past Surgical History:  Procedure Laterality Date   CATARACT EXTRACTION, BILATERAL Bilateral 02/2020   CERVICAL BIOPSY  W/ LOOP ELECTRODE EXCISION  2011   CESAREAN SECTION  1979   COLONOSCOPY  2017   UTSW, tics, 2 polyps, TA x 1, 5 yr recall   LAPAROTOMY N/A 02/05/2022   Procedure: EXPLORATORY LAPAROTOMY PROCTOSIGMOIDOSCOPY APPENDECTOMY;  Surgeon: [name], MD;  Location: [facility];  Service: General;  Laterality: N/A;   POLYPECTOMY  2017   TA x 1   TONSILLECTOMY  1957    Current Outpatient Medications  Medication Sig Dispense Refill    atenolol (TENORMIN) 25 MG tablet TAKE 1 TABLET BY MOUTH EVERY DAY (Patient taking differently: Take 25 mg by mouth daily.) 90 tablet 1   atorvastatin (LIPITOR) 40 MG tablet TAKE 1 TABLET BY MOUTH EVERYDAY AT BEDTIME 90 tablet 1   Calcium Carb-Cholecalciferol (CALCIUM 1000 + D PO) Take 2 tablets by mouth daily.     CVS SODIUM CHLORIDE 5 % ophthalmic solution Place 1 drop into both eyes as needed for eye irritation.     estradiol (ESTRACE) 0.1 MG/GM vaginal cream Place 1 Applicatorful vaginally 3 (three) times a week. 42.5 g 5   fluticasone (FLONASE) 50 MCG/ACT nasal spray Place 2 sprays into both nostrils daily.     GLUCOSAMINE-CHONDROITIN PO Take 2 capsules by mouth daily. 750-100//125-1.65     methylcellulose (CITRUCEL) oral powder Take 1 packet by mouth daily.     MYRBETRIQ 50 MG TB24 tablet TAKE 1 TABLET BY MOUTH EVERY DAY (Patient taking differently: Take 50 mg by mouth daily.) 90 tablet 1   oxymetazoline (AFRIN) 0.05 % nasal spray Place 1 spray into both nostrils 2 (two) times daily as needed (congestion during flights).     Probiotic Product (PROBIOTIC PO) Take 1 tablet by mouth daily.     triamterene-hydrochlorothiazide (MAXZIDE-25) 37.5-25 MG tablet TAKE 1  TABLET BY MOUTH EVERY DAY 90 tablet 1   No current facility-administered medications for this visit.    Family History  Adopted: Yes  Problem Relation Age of Onset   Breast cancer Neg Hx     Review of Systems  Constitutional: Negative.   Genitourinary: Negative.    Exam:   BP 138/72   Pulse 70   Ht '5\' 10"'$  (1.778 m)   Wt 163 lb 9.6 oz (74.2 kg)   BMI 23.47 kg/m   Height: '5\' 10"'$  (177.8 cm)  General appearance: alert, cooperative and appears stated age Breasts: normal appearance, no masses or tenderness Abdomen: soft, non-tender; bowel sounds normal; no masses,  no organomegaly, well healed midline scar Lymph nodes: Cervical, supraclavicular, and axillary nodes normal.  No abnormal inguinal nodes palpated Neurologic:  Grossly normal  Pelvic: External genitalia:  no lesions              Urethra:  normal appearing urethra with no masses, tenderness or lesions              Bartholins and Skenes: normal                 Vagina: normal appearing vagina with atrophic changes and no discharge, no lesions              Cervix: no lesions              Pap taken: Yes.   Bimanual Exam:  Uterus:  normal size, contour, position, consistency, mobility, non-tender              Adnexa: no mass, fullness, tenderness               Rectovaginal: Confirms               Anus:  normal sphincter tone, no lesions  Chaperone, Ezekiel Ina, RN, was present for exam.  Assessment/Plan: 1. Postmenopausal - no HRT - MMG up to date - colonoscopy up to date  2. History of loop electrical excision procedure (LEEP) - Cytology - PAP( [facility])  3. History of cervical dysplasia  4. Intramural uterine fibroid - no additional imaging needed  5. Pelvic abscess in female - s/p laparotomy with appendectomy

## 2022-04-30 DIAGNOSIS — D251 Intramural leiomyoma of uterus: Secondary | ICD-10-CM | POA: Insufficient documentation

## 2022-05-03 LAB — CYTOLOGY - PAP
Comment: NEGATIVE
Comment: NEGATIVE
Comment: NEGATIVE
Diagnosis: NEGATIVE
HPV 16: NEGATIVE
HPV 18 / 45: NEGATIVE
High risk HPV: POSITIVE — AB

## 2022-05-12 ENCOUNTER — Telehealth: Payer: Self-pay

## 2022-05-12 NOTE — Telephone Encounter (Signed)
Patient called into office and wanted to know if she could get a refill on MYRBETRIQ 50 MG TB24 tablet    she stated she has called pharmacy and prescription was denied.

## 2022-05-14 ENCOUNTER — Other Ambulatory Visit: Payer: Self-pay | Admitting: Family

## 2022-05-14 MED ORDER — MIRABEGRON ER 50 MG PO TB24: 50.0000 mg | ORAL_TABLET | Freq: Every day | ORAL | 1 refills | Status: DC

## 2022-08-31 ENCOUNTER — Other Ambulatory Visit: Payer: Self-pay | Admitting: Surgery

## 2022-08-31 DIAGNOSIS — K432 Incisional hernia without obstruction or gangrene: Secondary | ICD-10-CM

## 2022-09-28 ENCOUNTER — Ambulatory Visit
Admission: RE | Admit: 2022-09-28 | Discharge: 2022-09-28 | Disposition: A | Discharge status: Home / Self Care | Payer: Medicare Other | Source: Ambulatory Visit | Attending: Surgery | Admitting: Surgery

## 2022-09-28 DIAGNOSIS — K432 Incisional hernia without obstruction or gangrene: Secondary | ICD-10-CM

## 2022-09-28 MED ORDER — IOPAMIDOL (ISOVUE-300) INJECTION 61%
100.0000 mL | Freq: Once | INTRAVENOUS | Status: AC | PRN
  Administered 2022-09-28: 100 mL via INTRAVENOUS

## 2022-11-12 ENCOUNTER — Other Ambulatory Visit: Payer: Self-pay

## 2022-11-12 ENCOUNTER — Emergency Department (HOSPITAL_BASED_OUTPATIENT_CLINIC_OR_DEPARTMENT_OTHER): Payer: Medicare Other | Admitting: Radiology

## 2022-11-12 ENCOUNTER — Encounter (HOSPITAL_BASED_OUTPATIENT_CLINIC_OR_DEPARTMENT_OTHER): Payer: Self-pay | Admitting: Emergency Medicine

## 2022-11-12 ENCOUNTER — Emergency Department (HOSPITAL_BASED_OUTPATIENT_CLINIC_OR_DEPARTMENT_OTHER)
Admission: EM | Admit: 2022-11-12 | Discharge: 2022-11-12 | Disposition: A | Discharge status: Home / Self Care | Payer: Medicare Other | Attending: Emergency Medicine | Admitting: Emergency Medicine

## 2022-11-12 DIAGNOSIS — I10 Essential (primary) hypertension: Secondary | ICD-10-CM | POA: Insufficient documentation

## 2022-11-12 DIAGNOSIS — Z79899 Other long term (current) drug therapy: Secondary | ICD-10-CM | POA: Diagnosis not present

## 2022-11-12 DIAGNOSIS — W01198A Fall on same level from slipping, tripping and stumbling with subsequent striking against other object, initial encounter: Secondary | ICD-10-CM | POA: Diagnosis not present

## 2022-11-12 DIAGNOSIS — R0781 Pleurodynia: Secondary | ICD-10-CM | POA: Diagnosis not present

## 2022-11-12 DIAGNOSIS — R030 Elevated blood-pressure reading, without diagnosis of hypertension: Secondary | ICD-10-CM

## 2022-11-12 DIAGNOSIS — S99921A Unspecified injury of right foot, initial encounter: Secondary | ICD-10-CM | POA: Diagnosis present

## 2022-11-12 DIAGNOSIS — W19XXXA Unspecified fall, initial encounter: Secondary | ICD-10-CM

## 2022-11-12 DIAGNOSIS — S92334A Nondisplaced fracture of third metatarsal bone, right foot, initial encounter for closed fracture: Secondary | ICD-10-CM

## 2022-11-12 HISTORY — DX: Diverticulitis of intestine, part unspecified, without perforation or abscess without bleeding: K57.92

## 2022-11-12 IMAGING — MG MM DIGITAL SCREENING BILAT W/ TOMO AND CAD
6 of 10 series · 6 of 30 positions shown · non-contrast
Comparison: Previous exam(s).

CLINICAL DATA: Screening.

EXAM:
DIGITAL SCREENING BILATERAL MAMMOGRAM WITH TOMOSYNTHESIS AND CAD
TECHNIQUE: Bilateral screening digital craniocaudal and mediolateral oblique
mammograms were obtained. Bilateral screening digital breast
tomosynthesis was performed. The images were evaluated with
computer-aided detection.

[R CC synth-2D]
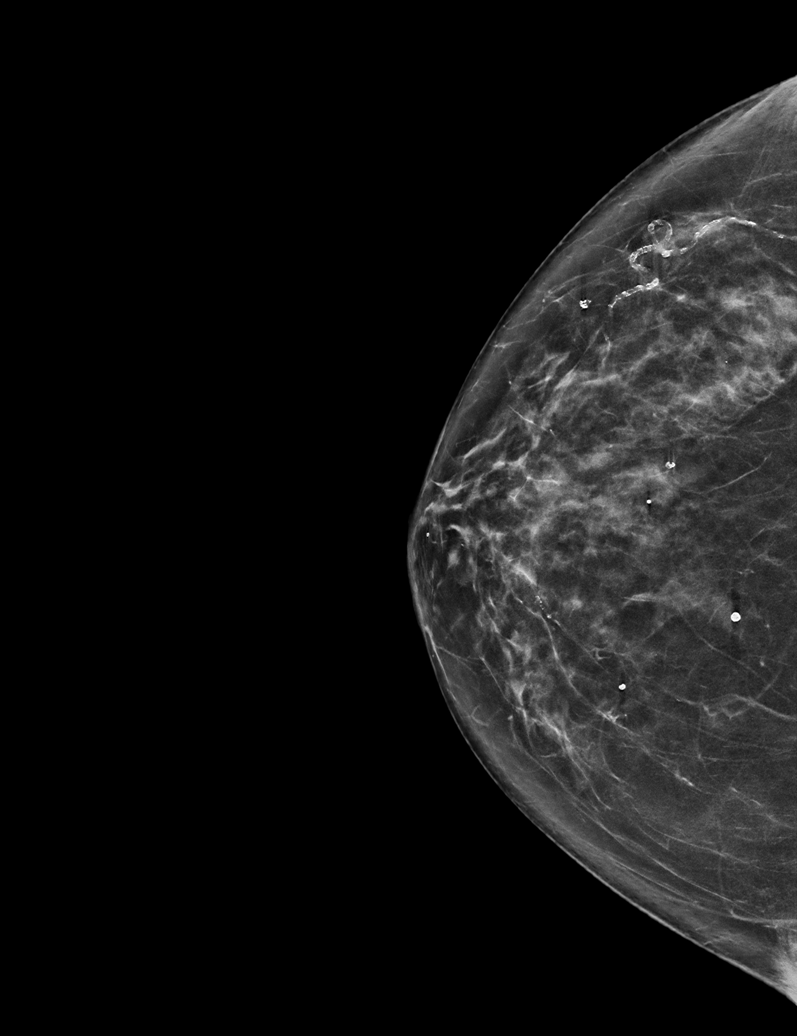

[R MLO synth-2D]
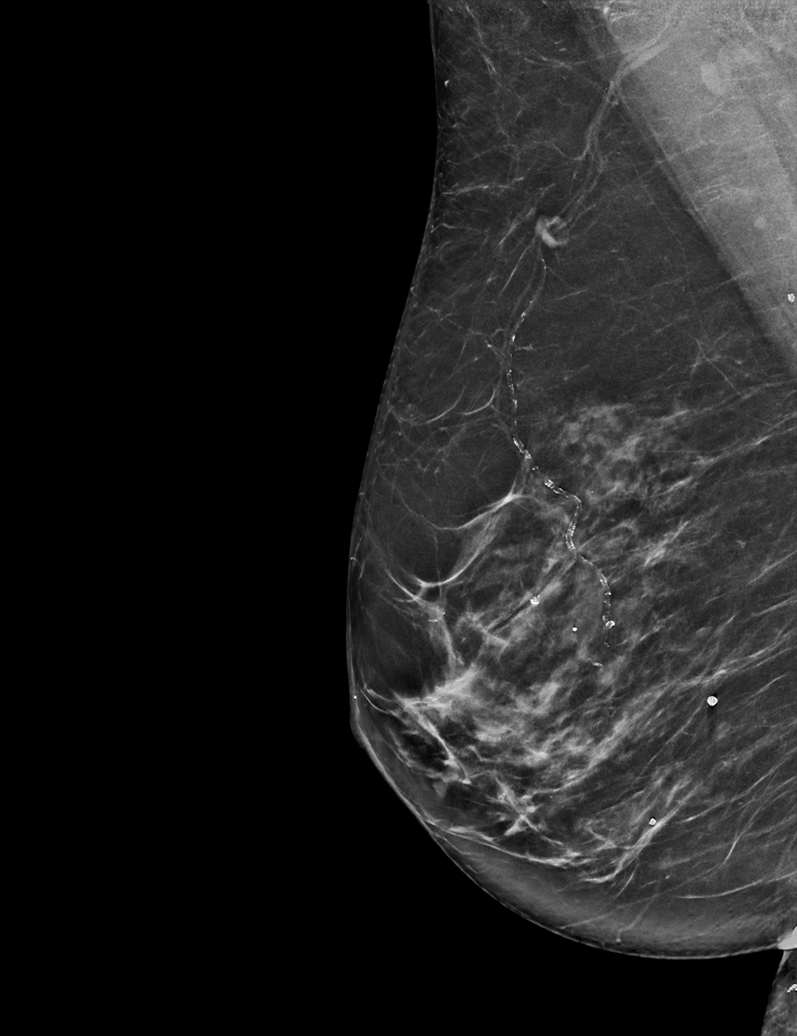

[L CC synth-2D]
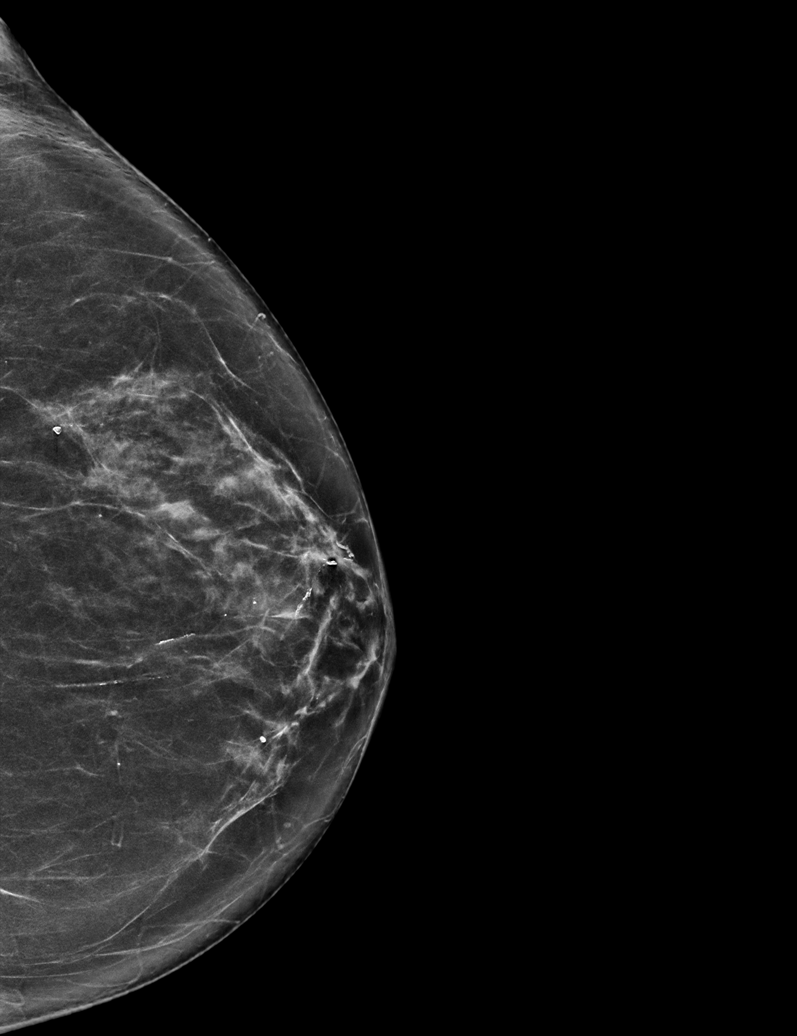

[R XCCL synth-2D]
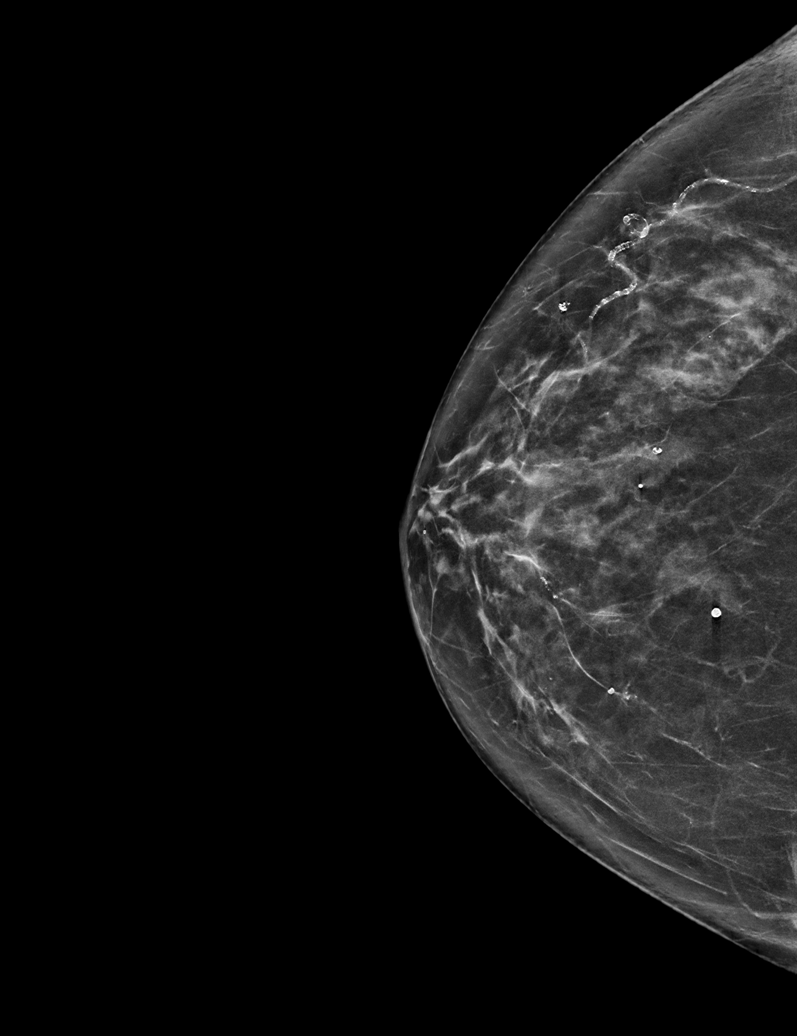

[L MLO synth-2D]
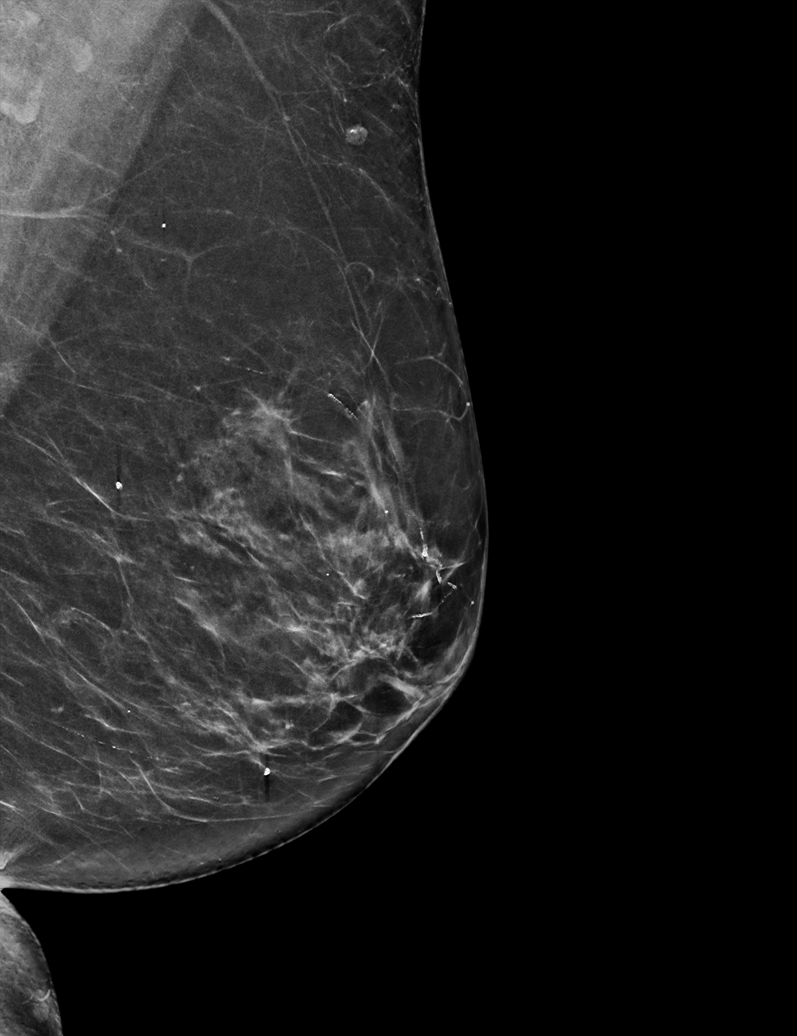

[R XCCL tomo · tomo slice 35/68.0]
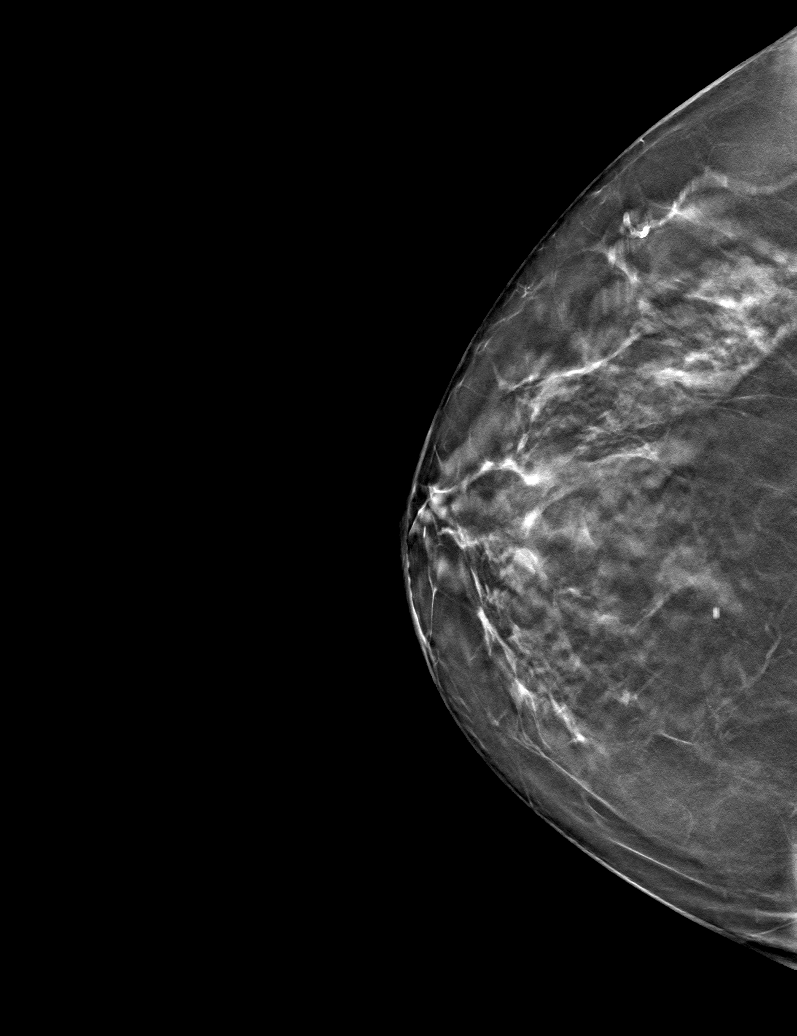

[6 of 30 positions shown; findings below may reference images not displayed]

ACR Breast Density Category c: The breast tissue is heterogeneously
dense, which may obscure small masses.
FINDINGS: There are no findings suspicious for malignancy.
IMPRESSION: No mammographic evidence of malignancy. A result letter of this
screening mammogram will be mailed directly to the patient.

RECOMMENDATION:
Screening mammogram in one year. (Code:Q3-W-BC3)

BI-RADS CATEGORY  1: Negative.

## 2022-11-12 MED ORDER — TRAMADOL HCL 50 MG PO TABS: 50.0000 mg | ORAL_TABLET | Freq: Four times a day (QID) | ORAL | 0 refills | Status: DC | PRN

## 2022-11-12 NOTE — Discharge Instructions (Addendum)
Was a pleasure taking care of you here in the emergency department today  Your x-ray of your chest did not show any rib fractures.  We have given you an incentive spirometer to encourage you to take deep breaths so you do not get a pneumonia.  You do have a fracture at the very distal aspect of your third toe.  We have take this next to the other 1.  Keep doing this over the next few days.  Follow-up outpatient.  Take Tylenol or ibuprofen as needed for pain

## 2022-11-12 NOTE — ED Triage Notes (Signed)
Pt presents to ED Pov. Pt c/o L rib and hip pain s/p mechanical fall. Pt reports that she was in hosp a couple weeks ago and receiving heparin. Reports no head injury.

## 2022-11-12 NOTE — ED Provider Notes (Signed)
[facility] EMERGENCY DEPT Provider Note   CSN: [id number] Arrival date & time: 11/12/22  1414     History  Chief Complaint  Patient presents with   Lytle Michaels    Caitlin Bowers is a 71 y.o. female history of known hernia, Graves' disease, hypertension here for evaluation mechanical fall.  Tripped and fell earlier today.  Hit her left mid ribs and right foot.  She denies hitting her head, LOC or anticoagulation.  Has no pain to her abdomen.  Pain worse with movement and palpation to the left chest at mid breast and to right 2/3 digits on right foot. Ambulatory PTA.  HPI     Home Medications Prior to Admission medications   Medication Sig Start Date End Date Taking? Authorizing Provider  atenolol (TENORMIN) 25 MG tablet TAKE 1 TABLET BY MOUTH EVERY DAY Patient taking differently: Take 25 mg by mouth daily. 11/07/21   Koberlein, Steele Berg, MD  atorvastatin (LIPITOR) 40 MG tablet TAKE 1 TABLET BY MOUTH EVERYDAY AT BEDTIME 04/13/22   Koberlein, Andris Flurry C, MD  Calcium Carb-Cholecalciferol (CALCIUM 1000 + D PO) Take 2 tablets by mouth daily.    [provider]  [employer] SODIUM CHLORIDE 5 % ophthalmic solution Place 1 drop into both eyes as needed for eye irritation. 05/19/21   [provider]  estradiol (ESTRACE) 0.1 MG/GM vaginal cream Place 1 Applicatorful vaginally 3 (three) times a week. 08/22/21   Koberlein, Steele Berg, MD  fluticasone (FLONASE) 50 MCG/ACT nasal spray Place 2 sprays into both nostrils daily. 11/23/11   [provider]  GLUCOSAMINE-CHONDROITIN PO Take 2 capsules by mouth daily. 750-100//125-1.65    [provider]  methylcellulose (CITRUCEL) oral powder Take 1 packet by mouth daily.    [provider]  mirabegron ER (MYRBETRIQ) 50 MG TB24 tablet Take 1 tablet (50 mg total) by mouth daily. 05/14/22   Kennyth Arnold, FNP  oxymetazoline (AFRIN) 0.05 % nasal spray Place 1 spray into both nostrils 2 (two) times daily as  needed (congestion during flights).    [provider]  Probiotic Product (PROBIOTIC PO) Take 1 tablet by mouth daily.    [provider]  traMADol (ULTRAM) 50 MG tablet Take 1 tablet (50 mg total) by mouth every 6 (six) hours as needed. 11/12/22  Yes Tymir Terral A, PA-C  triamterene-hydrochlorothiazide (MAXZIDE-25) 37.5-25 MG tablet TAKE 1 TABLET BY MOUTH EVERY DAY 04/20/22   Caren Macadam, MD      Allergies    Oxybutynin, Penicillins, and Sulfa antibiotics    Review of Systems   Review of Systems  Constitutional: Negative.   HENT: Negative.    Respiratory: Negative.    Cardiovascular:  Positive for chest pain (left chest wall pain).  Gastrointestinal: Negative.   Genitourinary: Negative.   Musculoskeletal:        Right foot pain at 2/3 digits  Skin: Negative.   Neurological: Negative.   All other systems reviewed and are negative.   Physical Exam Updated Vital Signs BP (!) 174/104   Pulse 81   Temp 98.6 F (37 C) (Oral)   Resp 18   SpO2 100%  Physical Exam Vitals and nursing note reviewed.  Constitutional:      General: She is not in acute distress.    Appearance: She is well-developed. She is not ill-appearing, toxic-appearing or diaphoretic.  HENT:     Head: Normocephalic and atraumatic.     Nose: Nose normal.     Mouth/Throat:  Mouth: Mucous membranes are moist.  Eyes:     Pupils: Pupils are equal, round, and reactive to light.  Cardiovascular:     Rate and Rhythm: Normal rate.     Pulses: Normal pulses.          Radial pulses are 2+ on the right side and 2+ on the left side.       Dorsalis pedis pulses are 2+ on the right side and 2+ on the left side.     Heart sounds: Normal heart sounds.  Pulmonary:     Effort: Pulmonary effort is normal. No respiratory distress.     Breath sounds: Normal breath sounds.  Chest:     Chest wall: Tenderness present. No mass, lacerations, deformity, swelling, crepitus or edema. There is no  dullness to percussion.       Comments: Tenderness right anterior lateral mid ribs.  No overlying skin changes.  Nontender lower ribs.  No crepitus or step-off. Abdominal:     General: Bowel sounds are normal. There is no distension.     Palpations: Abdomen is soft.     Tenderness: There is no abdominal tenderness. There is no right CVA tenderness, left CVA tenderness, guarding or rebound.     Comments: Soft, nontender, specifically no tenderness over spleen  Musculoskeletal:        General: Normal range of motion.     Cervical back: Normal range of motion.     Comments: No C/T/L tenderness.  Lift bilateral legs without difficulty.  No bony tenderness to bilateral upper arms.  Tenderness to right foot digits 2 and 3 no tenderness to midfoot.  Skin:    General: Skin is warm and dry.     Capillary Refill: Capillary refill takes less than 2 seconds.     Comments: Ecchymosis to right great toe.  No subungual hematoma.  No lacerations. Abrasion to distal right 3rd digit  Neurological:     General: No focal deficit present.     Mental Status: She is alert.     Cranial Nerves: Cranial nerves 2-12 are intact.     Sensory: Sensation is intact.     Motor: Motor function is intact.     Comments: Intact sensation ambulatory  Psychiatric:        Mood and Affect: Mood normal.     ED Results / Procedures / Treatments   Labs (all labs ordered are listed, but only abnormal results are displayed) Labs Reviewed - No data to display  EKG None  Radiology DG Foot 2 Views Right  Result Date: 11/12/2022 CLINICAL DATA:  Fall.  Foot pain. EXAM: RIGHT FOOT - 2 VIEW COMPARISON:  None Available. FINDINGS: Acute fracture of the distal aspect of the proximal phalanx of the third toe without significant angulation or displacement. Presumed old post traumatic deformity of the distal phalanx of the great toe. If the patient is painful in this location, this could possibly be a recent fracture. 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 No other bone or joint abnormality seen in the foot. IMPRESSION: 1. Acute fracture of the distal aspect of the proximal phalanx of the third toe without significant angulation or displacement. 2. Presumed old post traumatic deformity of the distal phalanx of the great toe. Electronically Signed   By: Nelson Chimes M.D.   On: 11/12/2022 14:57   DG Ribs Unilateral W/Chest Left  Result Date: 11/12/2022 CLINICAL DATA:  Fall with left rib and hip pain  EXAM: LEFT RIBS AND CHEST - 3+ VIEW COMPARISON:  None Available. FINDINGS: Heart size is normal. Mild tortuosity of the aorta. The lungs are clear. No pneumothorax or hemothorax. Rib films performed with a marker overlying the region of concern do not show any left lower rib fracture. IMPRESSION: No active cardiopulmonary disease. No left lower rib fracture seen. Mild tortuosity of the aorta. Electronically Signed   By: Nelson Chimes M.D.   On: 11/12/2022 14:55    Procedures .Ortho Injury Treatment  Date/Time: 11/12/2022 7:24 PM  Performed by: Shelby Dubin A, PA-C Authorized by: Nettie Elm, PA-C   Consent:    Consent obtained:  Verbal   Consent given by:  Patient   Risks discussed:  Fracture, nerve damage, restricted joint movement, vascular damage, stiffness, recurrent dislocation and irreducible dislocation   Alternatives discussed:  No treatment, alternative treatment, immobilization, referral and delayed treatmentInjury location: toe Location details: right third toe Injury type: fracture Fracture type: distal phalanx Pre-procedure neurovascular assessment: neurovascularly intact Pre-procedure distal perfusion: normal Pre-procedure neurological function: normal Pre-procedure range of motion: normal  Anesthesia: Local anesthesia used: no  Patient sedated: NoManipulation performed: no Immobilization: tape Splint Applied by: ED Nurse Post-procedure neurovascular assessment:  post-procedure neurovascularly intact Post-procedure distal perfusion: normal Post-procedure neurological function: normal Post-procedure range of motion: normal       Medications Ordered in ED Medications - No data to display  ED Course/ Medical Decision Making/ A&P    71 year old here for evaluation of mechanical fall which occurred earlier today.  Hit her left lateral mid ribs near her breast as well as her right foot.  Has had pain since.  Ambulatory.  Neurovascular intact.  Denies hitting head, LOC or anticoagulation.  Denies any abdominal pain.  Reproducible tenderness to her left anterior lateral mid ribs, no obvious flail chest.  No overlying skin changes.  Her abdomen is soft, nontender, specifically nontender over spleen.  Midline C/T/L tenderness.  Some ecchymosis to her distal right great toe however no subungual hematoma.  More so tenderness around second and third digit on her right lower extremity.  No bony tenderness to midfoot.  No shortening or rotation of legs.  Will plan on imaging  Imaging personally viewed and interpreted:  X-ray chest with left ribs without acute rib fracture X-ray right foot with distal third metatarsal fracture  Discussed results with patient, family in room. Will DC home with incentive spirometer, buddy tape toes, Friday postop boot. Head injury to suggest needing head CT.   Her blood pressure was elevated here.  States has been elevated over the last few weeks.  States she has not been follow-up with PCP for this.  She is compliant with her home meds.  Low suspicion for hypertensive urgency or emergency.  I discussed following up with her PCP for possible med adjustment.  The patient has been appropriately medically screened and/or stabilized in the ED. I have low suspicion for any other emergent medical condition which would require further screening, evaluation or treatment in the ED or require inpatient management.  Patient is hemodynamically  stable and in no acute distress.  Patient able to ambulate in department prior to ED.  Evaluation does not show acute pathology that would require ongoing or additional emergent interventions while in the emergency department or further inpatient treatment.  I have discussed the diagnosis with the patient and answered all questions.  Pain is been managed while in the emergency department and patient has no further complaints prior to  discharge.  Patient is comfortable with plan discussed in room and is stable for discharge at this time.  I have discussed strict return precautions for returning to the emergency department.  Patient was encouraged to follow-up with PCP/specialist refer to at discharge.                            Medical Decision Making Amount and/or Complexity of Data Reviewed Independent Historian: spouse External Data Reviewed: labs, radiology and notes. Radiology: ordered and independent interpretation performed. Decision-making details documented in ED Course.  Risk OTC drugs. Prescription drug management. Decision regarding hospitalization. Diagnosis or treatment significantly limited by social determinants of health.          Final Clinical Impression(s) / ED Diagnoses Final diagnoses:  Fall, initial encounter  Rib pain  Closed nondisplaced fracture of third metatarsal bone of right foot, initial encounter  Elevated blood pressure reading    Rx / DC Orders ED Discharge Orders          Ordered    traMADol (ULTRAM) 50 MG tablet  Every 6 hours PRN        11/12/22 1927              Neithan Day A, PA-C 11/12/22 1929    Regan Lemming, MD 11/12/22 2322

## 2022-11-21 ENCOUNTER — Other Ambulatory Visit: Payer: Self-pay | Admitting: *Deleted

## 2022-11-21 DIAGNOSIS — E785 Hyperlipidemia, unspecified: Secondary | ICD-10-CM

## 2022-11-21 MED ORDER — ATORVASTATIN CALCIUM 40 MG PO TABS: ORAL_TABLET | ORAL | 0 refills | Status: DC

## 2022-11-24 ENCOUNTER — Ambulatory Visit: Payer: Self-pay | Admitting: Surgery

## 2022-11-30 ENCOUNTER — Encounter (HOSPITAL_COMMUNITY): Payer: Medicare Other

## 2022-12-08 ENCOUNTER — Ambulatory Visit: Admit: 2022-12-08 | Payer: Medicare Other | Admitting: Surgery

## 2022-12-08 SURGERY — REPAIR, HERNIA, VENTRAL, ROBOT-ASSISTED
Anesthesia: General

## 2022-12-11 ENCOUNTER — Encounter: Payer: Self-pay | Admitting: Family Medicine

## 2022-12-11 ENCOUNTER — Ambulatory Visit (INDEPENDENT_AMBULATORY_CARE_PROVIDER_SITE_OTHER): Payer: Medicare Other | Admitting: Family Medicine

## 2022-12-11 VITALS — BP 120/64 | HR 73 | Temp 98.3°F | Ht 70.0 in | Wt 166.8 lb

## 2022-12-11 DIAGNOSIS — E785 Hyperlipidemia, unspecified: Secondary | ICD-10-CM | POA: Diagnosis not present

## 2022-12-11 DIAGNOSIS — R3915 Urgency of urination: Secondary | ICD-10-CM | POA: Diagnosis not present

## 2022-12-11 DIAGNOSIS — E2839 Other primary ovarian failure: Secondary | ICD-10-CM

## 2022-12-11 DIAGNOSIS — I10 Essential (primary) hypertension: Secondary | ICD-10-CM | POA: Diagnosis not present

## 2022-12-11 DIAGNOSIS — Z1231 Encounter for screening mammogram for malignant neoplasm of breast: Secondary | ICD-10-CM

## 2022-12-11 DIAGNOSIS — Z8639 Personal history of other endocrine, nutritional and metabolic disease: Secondary | ICD-10-CM

## 2022-12-11 MED ORDER — MIRABEGRON ER 50 MG PO TB24: 50.0000 mg | ORAL_TABLET | Freq: Every day | ORAL | 3 refills | Status: DC

## 2022-12-11 MED ORDER — ATENOLOL 25 MG PO TABS: 12.5000 mg | ORAL_TABLET | Freq: Every day | ORAL | 1 refills | Status: DC

## 2022-12-11 MED ORDER — ESTRADIOL 0.1 MG/GM VA CREA: 1.0000 | TOPICAL_CREAM | VAGINAL | 5 refills | Status: DC

## 2022-12-11 MED ORDER — TRIAMTERENE-HCTZ 37.5-25 MG PO TABS: 1.0000 | ORAL_TABLET | Freq: Every day | ORAL | 3 refills | Status: DC

## 2022-12-11 MED ORDER — ATORVASTATIN CALCIUM 40 MG PO TABS: ORAL_TABLET | ORAL | 3 refills | Status: DC

## 2022-12-11 NOTE — Progress Notes (Signed)
Established Patient Office Visit  Subjective   Patient ID: Coretha Creswell, female    DOB: [birth year]  Age: 72 y.o. MRN: [id number]  Chief Complaint  Patient presents with  . Establish Care    Patient is here for transition of care visit. Patient states that she had a ventral hernia repair surgery that was cancelled. States that she was diagnosed earlier in the year with perforated bowel, was hospitalized twice in the last year with this, once here and once in [place]. States that her surgeon reported that if she had had an intraperitoneal infection that using a mesh would be higher risk so the surgery was cancelled. Pt reports she has not had any further problems with abdominal pain or diverticula. States she is keeping her stool soft, drinking lots of fluids.   HTN -- BP in office performed and is well controlled. She  reports no side effects to the medications, no chest pain, SOB, dizziness or headaches. She has a BP cuff at home and is checking BP regularly, reports they are in the normal range.     Current Outpatient Medications  Medication Instructions  . atenolol (TENORMIN) 12.5 mg, Oral, Daily  . atorvastatin (LIPITOR) 40 MG tablet TAKE 1 TABLET BY MOUTH EVERYDAY AT BEDTIME  . Calcium Carb-Cholecalciferol (CALCIUM 1000 + D PO) 2 tablets, Oral, Daily  . [facility] SODIUM CHLORIDE 5 % ophthalmic solution 1 drop, Both Eyes, As needed  . estradiol (ESTRACE) 0.1 MG/GM vaginal cream 1 Applicatorful, Vaginal, 3 times weekly  . fluticasone (FLONASE) 50 MCG/ACT nasal spray 2 sprays, Each Nare, Daily  . GLUCOSAMINE-CHONDROITIN PO 2 capsules, Oral, Daily, 750-100//125-1.65   . Lifitegrast (XIIDRA) 5 % SOLN 1 drop, Ophthalmic, 2 times daily, Bilateral eyes  . methylcellulose (CITRUCEL) oral powder 1 packet, Oral, Daily  . mirabegron ER (MYRBETRIQ) 50 mg, Oral, Daily  . oxymetazoline (AFRIN) 0.05 % nasal spray 1 spray, Each Nare, 2 times daily PRN  . Probiotic Product (PROBIOTIC PO) 1  tablet, Oral, Daily  . traMADol (ULTRAM) 50 mg, Oral, Every 6 hours PRN  . triamterene-hydrochlorothiazide (MAXZIDE-25) 37.5-25 MG tablet 1 tablet, Oral, Daily    Patient Active Problem List   Diagnosis Date Noted  . Intramural uterine fibroid 04/30/2022  . History of appendectomy 02/13/2022  . Pelvic abscess in female 02/13/2022  . History of exploratory laparotomy 02/06/2022  . Urinary urgency 11/05/2019  . Hyperlipidemia 11/05/2019  . Hypertension 11/05/2019  . History of Graves' disease 11/05/2019  . 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     Objective:     BP 120/64 (BP Location: Left Arm, Patient Position: Sitting, Cuff Size: Large)   Pulse 73   Temp 98.3 F (36.8 C) (Oral)   Ht '5\' 10"'$  (1.778 m)   Wt 166 lb 12.8 oz (75.7 kg)   SpO2 98%   BMI 23.93 kg/m  {Vitals History (Optional):23777}  Physical Exam Vitals reviewed.  Constitutional:      Appearance: Normal appearance. She is well-groomed and normal weight.  Eyes:     Conjunctiva/sclera: Conjunctivae normal.  Neck:     Thyroid: No thyromegaly.  Cardiovascular:     Rate and Rhythm: Normal rate and regular rhythm.     Pulses: Normal pulses.     Heart sounds: S1 normal and S2 normal.  Pulmonary:     Effort: Pulmonary effort is normal.     Breath sounds: Normal breath  sounds and air entry.  Abdominal:     General: Bowel sounds are normal.  Musculoskeletal:     Right lower leg: No edema.     Left lower leg: No edema.  Neurological:     Mental Status: She is alert and oriented to person, place, and time. Mental status is at baseline.     Gait: Gait is intact.  Psychiatric:        Mood and Affect: Mood and affect normal.        Speech: Speech normal.        Behavior: Behavior normal.        Judgment: Judgment normal.     No results found for any visits on 12/11/22.  {Labs (Optional):23779}  The 10-year ASCVD risk score (Arnett DK,  et al., 2019) is: 15.2%    Assessment & Plan:   Problem List Items Addressed This Visit       Unprioritized   Urinary urgency - Primary   Relevant Medications   mirabegron ER (MYRBETRIQ) 50 MG TB24 tablet   Hyperlipidemia   Relevant Medications   atenolol (TENORMIN) 25 MG tablet   triamterene-hydrochlorothiazide (MAXZIDE-25) 37.5-25 MG tablet   atorvastatin (LIPITOR) 40 MG tablet   Other Relevant Orders   Lipid Panel   Hypertension   Relevant Medications   atenolol (TENORMIN) 25 MG tablet   triamterene-hydrochlorothiazide (MAXZIDE-25) 37.5-25 MG tablet   atorvastatin (LIPITOR) 40 MG tablet   Other Relevant Orders   CMP   Other Visit Diagnoses     Estrogen deficiency       Relevant Medications   estradiol (ESTRACE) 0.1 MG/GM vaginal cream   Encounter for screening mammogram for malignant neoplasm of breast       Relevant Orders   MM Digital Screening       No follow-ups on file.    Farrel Conners, MD

## 2022-12-12 DIAGNOSIS — E0500 Thyrotoxicosis with diffuse goiter without thyrotoxic crisis or storm: Secondary | ICD-10-CM | POA: Insufficient documentation

## 2022-12-12 NOTE — Assessment & Plan Note (Signed)
Stable on atorvastatin 40 mg daily, no side effects, no muscle cramps reported. She needs her annual lipid panel today.

## 2022-12-12 NOTE — Assessment & Plan Note (Signed)
Patient needs her annual TSH for surveillance, will order.

## 2022-12-12 NOTE — Assessment & Plan Note (Signed)
Patient reports that the myrbetriq 50 mg daily is working very well for her, has tried anticholinergics in the past but she had side effects to those medications, will refill her mybetriq 50 mg daily.

## 2022-12-12 NOTE — Assessment & Plan Note (Signed)
Patient needs her annual labs today, will order TSH for surveillance.

## 2022-12-12 NOTE — Assessment & Plan Note (Signed)
Current hypertension medications:       Sig   atenolol (TENORMIN) 25 MG tablet Take 0.5 tablets (12.5 mg total) by mouth daily.   triamterene-hydrochlorothiazide (MAXZIDE-25) 37.5-25 MG tablet Take 1 tablet by mouth daily.      BP is well controlled today on the above medications, will continue these as prescribed.

## 2022-12-14 ENCOUNTER — Other Ambulatory Visit (INDEPENDENT_AMBULATORY_CARE_PROVIDER_SITE_OTHER): Payer: Medicare Other

## 2022-12-14 DIAGNOSIS — I10 Essential (primary) hypertension: Secondary | ICD-10-CM

## 2022-12-14 DIAGNOSIS — Z8639 Personal history of other endocrine, nutritional and metabolic disease: Secondary | ICD-10-CM

## 2022-12-14 DIAGNOSIS — E785 Hyperlipidemia, unspecified: Secondary | ICD-10-CM

## 2022-12-14 LAB — LIPID PANEL
Cholesterol: 164 mg/dL (ref 0–200)
HDL: 50.6 mg/dL (ref >39.00)
LDL Cholesterol: 77 mg/dL (ref 0–99)
NonHDL: 113.05
Total CHOL/HDL Ratio: 3
Triglycerides: 178 mg/dL — ABNORMAL HIGH (ref 0.0–149.0)
VLDL: 35.6 mg/dL (ref 0.0–40.0)

## 2022-12-14 LAB — COMPREHENSIVE METABOLIC PANEL
ALT: 19 U/L (ref 0–35)
AST: 19 U/L (ref 0–37)
Albumin: 4.3 g/dL (ref 3.5–5.2)
Alkaline Phosphatase: 117 U/L (ref 39–117)
BUN: 17 mg/dL (ref 6–23)
CO2: 30 mEq/L (ref 19–32)
Calcium: 10.1 mg/dL (ref 8.4–10.5)
Chloride: 101 mEq/L (ref 96–112)
Creatinine, Ser: 1.11 mg/dL (ref 0.40–1.20)
GFR: 49.32 mL/min — ABNORMAL LOW (ref >60.00)
Glucose, Bld: 95 mg/dL (ref 70–99)
Potassium: 4.2 mEq/L (ref 3.5–5.1)
Sodium: 139 mEq/L (ref 135–145)
Total Bilirubin: 0.9 mg/dL (ref 0.2–1.2)
Total Protein: 7.1 g/dL (ref 6.0–8.3)

## 2022-12-14 LAB — T4, FREE: Free T4: 0.88 ng/dL (ref 0.60–1.60)

## 2022-12-14 LAB — TSH: TSH: 1.87 u[IU]/mL (ref 0.35–5.50)

## 2022-12-19 ENCOUNTER — Ambulatory Visit (INDEPENDENT_AMBULATORY_CARE_PROVIDER_SITE_OTHER): Payer: Medicare Other

## 2022-12-19 VITALS — Ht 70.0 in | Wt 166.0 lb

## 2022-12-19 DIAGNOSIS — Z0000 Encounter for general adult medical examination without abnormal findings: Secondary | ICD-10-CM | POA: Diagnosis not present

## 2022-12-19 NOTE — Progress Notes (Signed)
Subjective:   Caitlin Bowers is a 72 y.o. female who presents for Medicare Annual (Subsequent) preventive examination.  Review of Systems    Virtual Visit via Telephone Note  I connected with  [name] on 12/19/22 at  1:45 PM EST by telephone and verified that I am speaking with the correct person using two identifiers.  Location: Patient: Home Provider: Office Persons participating in the virtual visit: patient/Nurse Health Advisor   I discussed the limitations, risks, security and privacy concerns of performing an evaluation and management service by telephone and the availability of in person appointments. The patient expressed understanding and agreed to proceed.  Interactive audio and video telecommunications were attempted between this nurse and patient, however failed, due to patient having technical difficulties OR patient did not have access to video capability.  We continued and completed visit with audio only.  Some vital signs may be absent or patient reported.   Criselda Peaches, LPN  Cardiac Risk Factors include: advanced age (>48mn, >>83women)     Objective:    Today's Vitals   12/19/22 1344  Weight: 166 lb (75.3 kg)  Height: '5\' 10"'$  (1.778 m)   Body mass index is 23.82 kg/m.     12/19/2022    1:51 PM 11/12/2022    2:28 PM 02/05/2022    7:57 PM 12/16/2021    1:59 PM 05/22/2021    4:03 PM 12/15/2020    1:30 PM  Advanced Directives  Does Patient Have a Medical Advance Directive? Yes Yes Yes Yes No No  Type of AParamedicof AKelly Ridge[place] will Healthcare Power of [place]Living will [place] will    Does patient want to make changes to medical advance directive?   No - Patient declined No - Patient declined    Copy of [place] Chart? No - copy requested  No - copy requested No - copy requested    Would patient like information on  creating a medical advance directive?     No - Patient declined No - Patient declined    Current Medications (verified) Outpatient Encounter Medications as of 12/19/2022  Medication Sig   atenolol (TENORMIN) 25 MG tablet Take 0.5 tablets (12.5 mg total) by mouth daily.   atorvastatin (LIPITOR) 40 MG tablet TAKE 1 TABLET BY MOUTH EVERYDAY AT BEDTIME   Calcium Carb-Cholecalciferol (CALCIUM 1000 + D PO) Take 2 tablets by mouth daily.   [employer] SODIUM CHLORIDE 5 % ophthalmic solution Place 1 drop into both eyes as needed for eye irritation.   estradiol (ESTRACE) 0.1 MG/GM vaginal cream Place 1 Applicatorful vaginally 3 (three) times a week.   fluticasone (FLONASE) 50 MCG/ACT nasal spray Place 2 sprays into both nostrils daily.   GLUCOSAMINE-CHONDROITIN PO Take 2 capsules by mouth daily. 750-100//125-1.65   Lifitegrast (XIIDRA) 5 % SOLN Apply 1 drop to eye in the morning and at bedtime. Bilateral eyes   methylcellulose (CITRUCEL) oral powder Take 1 packet by mouth daily.   mirabegron ER (MYRBETRIQ) 50 MG TB24 tablet Take 1 tablet (50 mg total) by mouth daily.   oxymetazoline (AFRIN) 0.05 % nasal spray Place 1 spray into both nostrils 2 (two) times daily as needed (congestion during flights).   Probiotic Product (PROBIOTIC PO) Take 1 tablet by mouth daily.   traMADol (ULTRAM) 50 MG tablet Take 1 tablet (50 mg total) by mouth every 6 (six) hours as needed.   triamterene-hydrochlorothiazide (MAXZIDE-25) 37.5-25  MG tablet Take 1 tablet by mouth daily.   No facility-administered encounter medications on file as of 12/19/2022.    Allergies (verified) Oxybutynin, Penicillins, and Sulfa antibiotics   History: Past Medical History:  Diagnosis Date   Arthritis    RIGHT shoulder, bilateral hands   Cataract    bilateral sx   Diverticulitis    Graves disease    in remission as of 08/24/2021   Hyperlipidemia    on meds   Hypertension    on meds   Seasonal allergies    Urgency of urination    Past  Surgical History:  Procedure Laterality Date   CATARACT EXTRACTION, BILATERAL Bilateral 02/2020   CERVICAL BIOPSY  W/ LOOP ELECTRODE EXCISION  2011   [place]   COLONOSCOPY  2017   UTSW, tics, 2 polyps, TA x 1, 5 yr recall   LAPAROTOMY N/A 02/05/2022   Procedure: EXPLORATORY LAPAROTOMY PROCTOSIGMOIDOSCOPY APPENDECTOMY;  Surgeon: [name], MD;  Location: [facility];  Service: General;  Laterality: N/A;   POLYPECTOMY  2017   TA x 1   TONSILLECTOMY  1957   Family History  Adopted: Yes  Problem Relation Age of Onset   Breast cancer Neg Hx    Social History   Socioeconomic History   Marital status: Married    Spouse name: Not on file   Number of children: Not on file   Years of education: Not on file   Highest education level: Not on file  Occupational History   Not on file  Tobacco Use   Smoking status: Never   Smokeless tobacco: Never  Vaping Use   Vaping Use: Never used  Substance and Sexual Activity   Alcohol use: Yes    Alcohol/week: 7.0 standard drinks of alcohol    Types: 7 Shots of liquor per week    Comment: per week   Drug use: Never   Sexual activity: Yes    Birth control/protection: Post-menopausal  Other Topics Concern   Not on file  Social History Narrative   Not on file   Social Determinants of Health   Financial Resource Strain: Low Risk  (12/19/2022)   Overall Financial Resource Strain (CARDIA)    Difficulty of Paying Living Expenses: Not hard at all  Food Insecurity: No Food Insecurity (12/19/2022)   Hunger Vital Sign    Worried About Running Out of Food in the Last Year: Never true    Ran Out of Food in the Last Year: Never true  Transportation Needs: No Transportation Needs (12/19/2022)   PRAPARE - Hydrologist (Medical): No    Lack of Transportation (Non-Medical): No  Physical Activity: Sufficiently Active (12/19/2022)   Exercise Vital Sign    Days of Exercise per Week: 5 days    Minutes of  Exercise per Session: 40 min  Stress: No Stress Concern Present (12/16/2021)   [place]    Feeling of Stress : Not at all  Social Connections: [place] (12/19/2022)   Social Connection and Isolation Panel [NHANES]    Frequency of Communication with Friends and Family: More than three times a week    Frequency of Social Gatherings with Friends and Family: More than three times a week    Attends Religious Services: More than 4 times per year    Active Member of [employer] or Organizations: Yes    Attends Archivist Meetings: More than 4 times  per year    Marital Status: Married    Tobacco Counseling Counseling given: Not Answered   Clinical Intake:  Pre-visit preparation completed: No  Pain : No/denies pain     BMI - recorded: 23.82 Nutritional Risks: None Diabetes: No  How often do you need to have someone help you when you read instructions, pamphlets, or other written materials from your doctor or pharmacy?: 1 - Never  Diabetic? No  Interpreter Needed?: No  Information entered by :: Rolene Arbour LPN   Activities of Daily Living    12/19/2022    1:49 PM  In your present state of health, do you have any difficulty performing the following activities:  Hearing? 0  Vision? 0  Difficulty concentrating or making decisions? 0  Walking or climbing stairs? 0  Dressing or bathing? 0  Doing errands, shopping? 0  Preparing Food and eating ? N  Using the Toilet? N  In the past six months, have you accidently leaked urine? N  Do you have problems with loss of bowel control? N  Managing your Medications? N  Managing your Finances? N  Housekeeping or managing your Housekeeping? N    Patient Care Team: Farrel Conners, MD as PCP - General (Family Medicine) Ulla Gallo, MD as Consulting Physician (Dermatology)  Indicate any recent Medical Services you may have received from other  than [facility] providers in the past year (date may be approximate).     Assessment:   This is a routine wellness examination for Caitlin Bowers.  Hearing/Vision screen Hearing Screening - Comments:: Denies hearing difficulties   Vision Screening - Comments:: Wears rx glasses - up to date with routine eye exams with  Dr Celene Squibb  Dietary issues and exercise activities discussed: Exercise limited by: None identified   Goals Addressed               This Visit's Progress     No current goals (pt-stated)         Depression Screen    12/19/2022    1:48 PM 12/11/2022   11:08 AM 04/27/2022    3:09 PM 02/20/2022    9:02 AM 12/16/2021    1:53 PM 02/18/2021    9:13 AM 12/15/2020    1:32 PM  PHQ 2/9 Scores  PHQ - 2 Score 0 0 0 0 0 0 0  PHQ- 9 Score 0 0  0  0 0    Fall Risk    12/19/2022    1:49 PM 12/11/2022   11:09 AM 04/27/2022    3:09 PM 02/20/2022    8:59 AM 12/16/2021    1:56 PM  Fall Risk   Falls in the past year? 1 0 1 0 0  Number falls in past yr: 0 0 0 0 0  Injury with Fall? 1 0 0 0 0  Comment Fx Rt Toe. Followed by medical attention      Risk for fall due to : Impaired balance/gait;No Fall Risks No Fall Risks  No Fall Risks   Follow up Falls prevention discussed Falls evaluation completed  Falls evaluation completed     [place]:  Any stairs in or around the home? Yes  If so, are there any without handrails? No  Home free of loose throw rugs in walkways, pet beds, electrical cords, etc? Yes  Adequate lighting in your home to reduce risk of falls? Yes   ASSISTIVE DEVICES UTILIZED TO PREVENT FALLS:  Life alert? No  Use of a cane, walker or w/c? No  Grab bars in the bathroom? No  Shower chair or bench in shower? No  Elevated toilet seat or a handicapped toilet? No   TIMED UP AND GO:  Was the test performed? No .Audio visit    Cognitive Function:        12/19/2022    1:51 PM 12/16/2021    1:57 PM  6CIT Screen  What Year? 0 points 0  points  What month? 0 points 0 points  What time? 0 points 0 points  Count back from 20 0 points 0 points  Months in reverse 0 points 0 points  Repeat phrase 0 points 0 points  Total Score 0 points 0 points    Immunizations Immunization History  Administered Date(s) Administered   Influenza, High Dose Seasonal PF 08/17/2019, 08/10/2021   Influenza-Unspecified 10/23/2006, 10/08/2009, 09/13/2010, 08/30/2011, 09/03/2014, 08/07/2015, 03/04/2016, 08/04/2016, 09/03/2016, 07/30/2017, 08/10/2018, 08/11/2019, 08/06/2020, 08/10/2021, 09/07/2022   Moderna Sars-Covid-2 Vaccination 09/07/2022   PFIZER(Purple Top)SARS-COV-2 Vaccination 12/23/2019, 01/12/2020, 08/11/2020, 03/15/2021   PNEUMOCOCCAL CONJUGATE-20 08/10/2021   Pfizer Covid-19 Vaccine Bivalent Booster 49yr & up 10/14/2021, 06/01/2022   Pneumococcal Conjugate-13 10/01/2014   Pneumococcal Polysaccharide-23 02/04/2016   RSV,unspecified 10/09/2022   Respiratory Syncytial Virus Vaccine,Recomb Aduvanted(Arexvy) 10/09/2022   Tdap 02/14/2017   Zoster Recombinat (Shingrix) 02/13/2018, 06/23/2018   Zoster, Live 08/31/2011    TDAP status: Up to date  Flu Vaccine status: Up to date  Pneumococcal vaccine status: Up to date  Covid-19 vaccine status: Completed vaccines  Qualifies for Shingles Vaccine? Yes   Zostavax completed Yes   Shingrix Completed?: Yes  Screening Tests Health Maintenance  Topic Date Due   COVID-19 Vaccine (8 - 2023-24 season) 12/27/2022 (Originally 11/02/2022)   Medicare Annual Wellness (AWV)  12/20/2023   MAMMOGRAM  02/22/2024   DTaP/Tdap/Td (2 - Td or Tdap) 02/15/2027   COLONOSCOPY (Pts 45-435yrInsurance coverage will need to be confirmed)  09/07/2028   Pneumonia Vaccine 6534Years old  Completed   INFLUENZA VACCINE  Completed   DEXA SCAN  Completed   Hepatitis C Screening  Completed   Zoster Vaccines- Shingrix  Completed   HPV VACCINES  Aged Out    Health Maintenance  There are no preventive care  reminders to display for this patient.   Colorectal cancer screening: Type of screening: Colonoscopy. Completed 09/07/21. Repeat every 7 years  Mammogram status: Ordered 12/11/22. Pt provided with contact info and advised to call to schedule appt.   Bone Density status: Completed 12/04/17. Results reflect: Bone density results: OSTEOPOROSIS. Repeat every   years.  Lung Cancer Screening: (Low Dose CT Chest recommended if Age 74-80ears, 30 pack-year currently smoking OR have quit w/in 15years.) does not qualify.     Additional Screening:  Hepatitis C Screening: does qualify; Completed 02/16/20  Vision Screening: Recommended annual ophthalmology exams for early detection of glaucoma and other disorders of the eye. Is the patient up to date with their annual eye exam?  Yes  Who is the provider or what is the name of the office in which the patient attends annual eye exams? Dr McCelene Squibbf pt is not established with a provider, would they like to be referred to a provider to establish care? No .   Dental Screening: Recommended annual dental exams for proper oral hygiene  Community Resource Referral / Chronic Care Management:  CRR required this visit?  No   CCM required this visit?  No      Plan:  I have personally reviewed and noted the following in the patient's chart:   Medical and social history Use of alcohol, tobacco or illicit drugs  Current medications and supplements including opioid prescriptions. Patient is not currently taking opioid prescriptions. Functional ability and status Nutritional status Physical activity Advanced directives List of other physicians Hospitalizations, surgeries, and ER visits in previous 12 months Vitals Screenings to include cognitive, depression, and falls Referrals and appointments  In addition, I have reviewed and discussed with patient certain preventive protocols, quality metrics, and best practice recommendations. A written  personalized care plan for preventive services as well as general preventive health recommendations were provided to patient.     Criselda Peaches, LPN   0/14/1030   Nurse Notes: None

## 2022-12-19 NOTE — Patient Instructions (Addendum)
Caitlin Bowers , Thank you for taking time to come for your Medicare Wellness Visit. I appreciate your ongoing commitment to your health goals. Please review the following plan we discussed and let me know if I can assist you in the future.   These are the goals we discussed:  Goals       Exercise 150 min/wk Moderate Activity      No current goals (pt-stated)        This is a list of the screening recommended for you and due dates:  Health Maintenance  Topic Date Due   COVID-19 Vaccine (8 - 2023-24 season) 12/27/2022*   Medicare Annual Wellness Visit  12/20/2023   Mammogram  02/22/2024   DTaP/Tdap/Td vaccine (2 - Td or Tdap) 02/15/2027   Colon Cancer Screening  09/07/2028   Pneumonia Vaccine  Completed   Flu Shot  Completed   DEXA scan (bone density measurement)  Completed   Hepatitis C Screening: USPSTF Recommendation to screen - Ages 73-79 yo.  Completed   Zoster (Shingles) Vaccine  Completed   HPV Vaccine  Aged Out  *Topic was postponed. The date shown is not the original due date.    Advanced directives: In Chart  Conditions/risks identified: None  Next appointment: Follow up in one year for your annual wellness visit     Preventive Care 65 Years and Older, Female Preventive care refers to lifestyle choices and visits with your health care provider that can promote health and wellness. What does preventive care include? A yearly physical exam. This is also called an annual well check. Dental exams once or twice a year. Routine eye exams. Ask your health care provider how often you should have your eyes checked. Personal lifestyle choices, including: Daily care of your teeth and gums. Regular physical activity. Eating a healthy diet. Avoiding tobacco and drug use. Limiting alcohol use. Practicing safe sex. Taking low-dose aspirin every day. Taking vitamin and mineral supplements as recommended by your health care provider. What happens during an annual well  check? The services and screenings done by your health care provider during your annual well check will depend on your age, overall health, lifestyle risk factors, and family history of disease. Counseling  Your health care provider may ask you questions about your: Alcohol use. Tobacco use. Drug use. Emotional well-being. Home and relationship well-being. Sexual activity. Eating habits. History of falls. Memory and ability to understand (cognition). Work and work Statistician. Reproductive health. Screening  You may have the following tests or measurements: Height, weight, and BMI. Blood pressure. Lipid and cholesterol levels. These may be checked every 5 years, or more frequently if you are over 22 years old. Skin check. Lung cancer screening. You may have this screening every year starting at age 103 if you have a 30-pack-year history of smoking and currently smoke or have quit within the past 15 years. Fecal occult blood test (FOBT) of the stool. You may have this test every year starting at age 61. Flexible sigmoidoscopy or colonoscopy. You may have a sigmoidoscopy every 5 years or a colonoscopy every 10 years starting at age 20. Hepatitis C blood test. Hepatitis B blood test. Sexually transmitted disease (STD) testing. Diabetes screening. This is done by checking your blood sugar (glucose) after you have not eaten for a while (fasting). You may have this done every 1-3 years. Bone density scan. This is done to screen for osteoporosis. You may have this done starting at age 62. Mammogram. This may be  done every 1-2 years. Talk to your health care provider about how often you should have regular mammograms. Talk with your health care provider about your test results, treatment options, and if necessary, the need for more tests. Vaccines  Your health care provider may recommend certain vaccines, such as: Influenza vaccine. This is recommended every year. Tetanus, diphtheria, and  acellular pertussis (Tdap, Td) vaccine. You may need a Td booster every 10 years. Zoster vaccine. You may need this after age 94. Pneumococcal 13-valent conjugate (PCV13) vaccine. One dose is recommended after age 69. Pneumococcal polysaccharide (PPSV23) vaccine. One dose is recommended after age 27. Talk to your health care provider about which screenings and vaccines you need and how often you need them. This information is not intended to replace advice given to you by your health care provider. Make sure you discuss any questions you have with your health care provider. Document Released: 12/17/2015 Document Revised: 08/09/2016 Document Reviewed: 09/21/2015 Elsevier Interactive Patient Education  2017 [place] Prevention in the Home Falls can cause injuries. They can happen to people of all ages. There are many things you can do to make your home safe and to help prevent falls. What can I do on the outside of my home? Regularly fix the edges of walkways and driveways and fix any cracks. Remove anything that might make you trip as you walk through a door, such as a raised step or threshold. Trim any bushes or trees on the path to your home. Use bright outdoor lighting. Clear any walking paths of anything that might make someone trip, such as rocks or tools. Regularly check to see if handrails are loose or broken. Make sure that both sides of any steps have handrails. Any raised decks and porches should have guardrails on the edges. Have any leaves, snow, or ice cleared regularly. Use sand or salt on walking paths during winter. Clean up any spills in your garage right away. This includes oil or grease spills. What can I do in the bathroom? Use night lights. Install grab bars by the toilet and in the tub and shower. Do not use towel bars as grab bars. Use non-skid mats or decals in the tub or shower. If you need to sit down in the shower, use a plastic, non-slip stool. Keep  the floor dry. Clean up any water that spills on the floor as soon as it happens. Remove soap buildup in the tub or shower regularly. Attach bath mats securely with double-sided non-slip rug tape. Do not have throw rugs and other things on the floor that can make you trip. What can I do in the bedroom? Use night lights. Make sure that you have a light by your bed that is easy to reach. Do not use any sheets or blankets that are too big for your bed. They should not hang down onto the floor. Have a firm chair that has side arms. You can use this for support while you get dressed. Do not have throw rugs and other things on the floor that can make you trip. What can I do in the kitchen? Clean up any spills right away. Avoid walking on wet floors. Keep items that you use a lot in easy-to-reach places. If you need to reach something above you, use a strong step stool that has a grab bar. Keep electrical cords out of the way. Do not use floor polish or wax that makes floors slippery. If you must use wax,  use non-skid floor wax. Do not have throw rugs and other things on the floor that can make you trip. What can I do with my stairs? Do not leave any items on the stairs. Make sure that there are handrails on both sides of the stairs and use them. Fix handrails that are broken or loose. Make sure that handrails are as long as the stairways. Check any carpeting to make sure that it is firmly attached to the stairs. Fix any carpet that is loose or worn. Avoid having throw rugs at the top or bottom of the stairs. If you do have throw rugs, attach them to the floor with carpet tape. Make sure that you have a light switch at the top of the stairs and the bottom of the stairs. If you do not have them, ask someone to add them for you. What else can I do to help prevent falls? Wear shoes that: Do not have high heels. Have rubber bottoms. Are comfortable and fit you well. Are closed at the toe. Do not  wear sandals. If you use a stepladder: Make sure that it is fully opened. Do not climb a closed stepladder. Make sure that both sides of the stepladder are locked into place. Ask someone to hold it for you, if possible. Clearly mark and make sure that you can see: Any grab bars or handrails. First and last steps. Where the edge of each step is. Use tools that help you move around (mobility aids) if they are needed. These include: Canes. Walkers. Scooters. Crutches. Turn on the lights when you go into a dark area. Replace any light bulbs as soon as they burn out. Set up your furniture so you have a clear path. Avoid moving your furniture around. If any of your floors are uneven, fix them. If there are any pets around you, be aware of where they are. Review your medicines with your doctor. Some medicines can make you feel dizzy. This can increase your chance of falling. Ask your doctor what other things that you can do to help prevent falls. This information is not intended to replace advice given to you by your health care provider. Make sure you discuss any questions you have with your health care provider. Document Released: 09/16/2009 Document Revised: 04/27/2016 Document Reviewed: 12/25/2014 Elsevier Interactive Patient Education  2017 [employer].

## 2023-03-01 ENCOUNTER — Ambulatory Visit
Admission: RE | Admit: 2023-03-01 | Discharge: 2023-03-01 | Disposition: A | Discharge status: Home / Self Care | Payer: Medicare Other | Source: Ambulatory Visit | Attending: Family Medicine | Admitting: Family Medicine

## 2023-03-01 DIAGNOSIS — Z1231 Encounter for screening mammogram for malignant neoplasm of breast: Secondary | ICD-10-CM

## 2023-03-05 ENCOUNTER — Ambulatory Visit (INDEPENDENT_AMBULATORY_CARE_PROVIDER_SITE_OTHER): Payer: Medicare Other | Admitting: Family

## 2023-03-05 ENCOUNTER — Encounter: Payer: Self-pay | Admitting: Family

## 2023-03-05 VITALS — BP 154/88 | HR 71 | Temp 98.1°F | Ht 70.0 in | Wt 165.8 lb

## 2023-03-05 DIAGNOSIS — J069 Acute upper respiratory infection, unspecified: Secondary | ICD-10-CM | POA: Diagnosis not present

## 2023-03-05 MED ORDER — GUAIFENESIN-CODEINE 100-10 MG/5ML PO SYRP: 5.0000 mL | ORAL_SOLUTION | Freq: Three times a day (TID) | ORAL | 0 refills | Status: DC | PRN

## 2023-03-05 MED ORDER — PREDNISONE 20 MG PO TABS: ORAL_TABLET | ORAL | 0 refills | Status: DC

## 2023-03-05 NOTE — Progress Notes (Signed)
Patient ID: Caitlin Bowers, female    DOB: 05-Apr-1951, 72 y.o.   MRN: [id number]  Chief Complaint  Patient presents with   Cough    Pt c/o Cough with yellow mucus, Chest congestion and bilateral ear pressure. Has tried mucinex which did not help. Does not want to be covid tested.     HPI:      URI sx:  reports having sx for 3 days.  Denies fever. started as a dry cough, and then has been more productive, then dry again. Also reports sinus drainage, thick, clear to light yellow in color, nasal congestion, bilateral ear pressure. Has tried Mucinex syrup and OTC antihistamine/decongestant, Flonase which have not helped. Pt reports having her kitchen remodeled and there was a lot of dust in the air on Friday when her sx started.   Assessment & Plan:  1. Viral upper respiratory tract infection - lungs clear; sending cough syrup and prednisone. Advised on use & SE of both. advised to increase the Flonase to 1 squirt each nostril bid from qd, use saline nasal spray tid & prn & prior to Flonase. Increase water intake to 2L/d. Advised to wait 1-2d before starting the prednisone as the above tx may give her some relief. Call back end of week if sx are not improved.  - guaiFENesin-codeine (ROBITUSSIN AC) 100-10 MG/5ML syrup; Take 5 mLs by mouth 3 (three) times daily as needed for cough.  Dispense: 120 mL; Refill: 0 - predniSONE (DELTASONE) 20 MG tablet; Take 2 pills in the morning with breakfast for 3 days, then 1 pill for 2 days  Dispense: 8 tablet; Refill: 0  Subjective:    Outpatient Medications Prior to Visit  Medication Sig Dispense Refill   atenolol (TENORMIN) 25 MG tablet Take 0.5 tablets (12.5 mg total) by mouth daily. 90 tablet 1   atorvastatin (LIPITOR) 40 MG tablet TAKE 1 TABLET BY MOUTH EVERYDAY AT BEDTIME 90 tablet 3   Calcium Carb-Cholecalciferol (CALCIUM 1000 + D PO) Take 2 tablets by mouth daily.     [employer] SODIUM CHLORIDE 5 % ophthalmic solution Place 1 drop into both eyes as  needed for eye irritation.     estradiol (ESTRACE) 0.1 MG/GM vaginal cream Place 1 Applicatorful vaginally 3 (three) times a week. 42.5 g 5   fluticasone (FLONASE) 50 MCG/ACT nasal spray Place 2 sprays into both nostrils daily.     GLUCOSAMINE-CHONDROITIN PO Take 2 capsules by mouth daily. 750-100//125-1.65     Lifitegrast (XIIDRA) 5 % SOLN Apply 1 drop to eye in the morning and at bedtime. Bilateral eyes     methylcellulose (CITRUCEL) oral powder Take 1 packet by mouth daily.     mirabegron ER (MYRBETRIQ) 50 MG TB24 tablet Take 1 tablet (50 mg total) by mouth daily. 90 tablet 3   oxymetazoline (AFRIN) 0.05 % nasal spray Place 1 spray into both nostrils 2 (two) times daily as needed (congestion during flights).     Probiotic Product (PROBIOTIC PO) Take 1 tablet by mouth daily.     traMADol (ULTRAM) 50 MG tablet Take 1 tablet (50 mg total) by mouth every 6 (six) hours as needed. 10 tablet 0   triamterene-hydrochlorothiazide (MAXZIDE-25) 37.5-25 MG tablet Take 1 tablet by mouth daily. 90 tablet 3   No facility-administered medications prior to visit.   Past Medical History:  Diagnosis Date   Arthritis    RIGHT shoulder, bilateral hands   Cataract    bilateral sx   Diverticulitis  Graves disease    in remission as of 08/24/2021   Hyperlipidemia    on meds   Hypertension    on meds   Seasonal allergies    Urgency of urination    Past Surgical History:  Procedure Laterality Date   CATARACT EXTRACTION, BILATERAL Bilateral 02/2020   CERVICAL BIOPSY  W/ LOOP ELECTRODE EXCISION  2011   CESAREAN SECTION  1979   COLONOSCOPY  2017   UTSW, tics, 2 polyps, TA x 1, 5 yr recall   LAPAROTOMY N/A 02/05/2022   Procedure: EXPLORATORY LAPAROTOMY PROCTOSIGMOIDOSCOPY APPENDECTOMY;  Surgeon: [name], MD;  Location: [facility];  Service: General;  Laterality: N/A;   POLYPECTOMY  2017   TA x 1   TONSILLECTOMY  1957   Allergies  Allergen Reactions   Oxybutynin     Dry mouth; no taste;  not effective for incontinence   Penicillins Hives   Sulfa Antibiotics Hives      Objective:    Physical Exam Vitals and nursing note reviewed.  Constitutional:      Appearance: Normal appearance. She is ill-appearing.     Interventions: Face mask in place.  HENT:     Right Ear: Tympanic membrane and ear canal normal.     Left Ear: Ear canal normal. A middle ear effusion (mild) is present. Tympanic membrane is not injected, erythematous or bulging.     Nose:     Right Sinus: No frontal sinus tenderness.     Left Sinus: No frontal sinus tenderness.     Mouth/Throat:     Mouth: Mucous membranes are moist.     Pharynx: Posterior oropharyngeal erythema (mild) present. No pharyngeal swelling, oropharyngeal exudate or uvula swelling.     Tonsils: No tonsillar exudate or tonsillar abscesses.  Cardiovascular:     Rate and Rhythm: Normal rate and regular rhythm.  Pulmonary:     Effort: Pulmonary effort is normal.     Breath sounds: Normal breath sounds.  Musculoskeletal:        General: Normal range of motion.  Lymphadenopathy:     Head:     Right side of head: No preauricular or posterior auricular adenopathy.     Left side of head: No preauricular or posterior auricular adenopathy.     Cervical: No cervical adenopathy.  Skin:    General: Skin is warm and dry.  Neurological:     Mental Status: She is alert.  Psychiatric:        Mood and Affect: Mood normal.        Behavior: Behavior normal.    BP (!) 154/88 (BP Location: Left Arm, Patient Position: Sitting, Cuff Size: Large)   Pulse 71   Temp 98.1 F (36.7 C) (Temporal)   Ht 5\' 10"  (1.778 m)   Wt 165 lb 12.8 oz (75.2 kg)   SpO2 97%   BMI 23.79 kg/m  Wt Readings from Last 3 Encounters:  03/05/23 165 lb 12.8 oz (75.2 kg)  12/19/22 166 lb (75.3 kg)  12/11/22 166 lb 12.8 oz (75.7 kg)       Jeanie Sewer, NP

## 2023-05-02 ENCOUNTER — Ambulatory Visit (HOSPITAL_BASED_OUTPATIENT_CLINIC_OR_DEPARTMENT_OTHER): Payer: Medicare Other | Admitting: Obstetrics & Gynecology

## 2023-05-09 ENCOUNTER — Other Ambulatory Visit (HOSPITAL_COMMUNITY)
Admission: RE | Admit: 2023-05-09 | Discharge: 2023-05-09 | Disposition: A | Discharge status: Home / Self Care | Payer: Medicare Other | Source: Ambulatory Visit | Attending: Obstetrics & Gynecology | Admitting: Obstetrics & Gynecology

## 2023-05-09 ENCOUNTER — Ambulatory Visit (INDEPENDENT_AMBULATORY_CARE_PROVIDER_SITE_OTHER): Payer: Medicare Other | Admitting: Obstetrics & Gynecology

## 2023-05-09 ENCOUNTER — Encounter (HOSPITAL_BASED_OUTPATIENT_CLINIC_OR_DEPARTMENT_OTHER): Payer: Self-pay | Admitting: Obstetrics & Gynecology

## 2023-05-09 VITALS — BP 140/90 | HR 70 | Ht 70.0 in | Wt 167.2 lb

## 2023-05-09 DIAGNOSIS — Z9889 Other specified postprocedural states: Secondary | ICD-10-CM | POA: Diagnosis not present

## 2023-05-09 DIAGNOSIS — N952 Postmenopausal atrophic vaginitis: Secondary | ICD-10-CM

## 2023-05-09 DIAGNOSIS — K458 Other specified abdominal hernia without obstruction or gangrene: Secondary | ICD-10-CM

## 2023-05-09 DIAGNOSIS — Z87410 Personal history of cervical dysplasia: Secondary | ICD-10-CM | POA: Insufficient documentation

## 2023-05-09 DIAGNOSIS — Z13820 Encounter for screening for osteoporosis: Secondary | ICD-10-CM | POA: Diagnosis not present

## 2023-05-09 DIAGNOSIS — Z9189 Other specified personal risk factors, not elsewhere classified: Secondary | ICD-10-CM

## 2023-05-09 DIAGNOSIS — Z09 Encounter for follow-up examination after completed treatment for conditions other than malignant neoplasm: Secondary | ICD-10-CM | POA: Insufficient documentation

## 2023-05-09 DIAGNOSIS — Z01419 Encounter for gynecological examination (general) (routine) without abnormal findings: Secondary | ICD-10-CM

## 2023-05-09 DIAGNOSIS — B977 Papillomavirus as the cause of diseases classified elsewhere: Secondary | ICD-10-CM

## 2023-05-09 DIAGNOSIS — E2839 Other primary ovarian failure: Secondary | ICD-10-CM

## 2023-05-09 MED ORDER — ESTRADIOL 0.1 MG/GM VA CREA: 1.0000 | TOPICAL_CREAM | VAGINAL | 3 refills | Status: DC

## 2023-05-09 NOTE — Progress Notes (Addendum)
72 y.o. G1P1 Married White or Caucasian female here for breast and pelvic exam.  I am also following her for h/o abnormal pap smear and LEEP done in 2011.  I do not have pathology results from this time.  Current ASCCP guidelines recommend following cervix for 25 years after treatment for moderate or severe cervical dysplasia.  As most LEEPs are done for this reason, feel it is reasonable to continue screening this pt.  We discussed guidelines today.  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  Pt using vaginal estrogen cream.  Doing well with this.  Needs RF.  Pt does have abdominal hernia repair scheduled in August with Dr. Dossie Der.  Was scheduled earlier in the year but had to be rescheduled.  Currently having work done on home so hopefully this will be complete by that time.  Denies vaginal bleeding.  No LMP recorded. Patient is postmenopausal.          Sexually active: Yes.    H/o HR HPV  Health Maintenance: PCP:  Dr. Nira Conn.  Last wellness appt was 12/2022.  Did blood work at that appt:  yes Vaccines are up to date:  yes Colonoscopy:  09/07/2021 MMG:  03/01/2023 negative BMD:  prior to moving to [place] Last pap smear:  04/27/2022 Postive HPV.   H/o abnormal pap smear:  yes   reports that she has never smoked. She has never used smokeless tobacco. She reports current alcohol use of about 7.0 standard drinks of alcohol per week. She reports that she does not use drugs.  Past Medical History:  Diagnosis Date   Arthritis    RIGHT shoulder, bilateral hands   Cataract    bilateral sx   Diverticulitis    Graves disease    in remission as of 08/24/2021   Hyperlipidemia    on meds   Hypertension    on meds   Seasonal allergies    Urgency of urination     Past Surgical History:  Procedure Laterality Date   CATARACT EXTRACTION, BILATERAL Bilateral 02/2020   CERVICAL BIOPSY  W/ LOOP ELECTRODE EXCISION  2011   CESAREAN SECTION  1979   COLONOSCOPY  2017   UTSW,  tics, 2 polyps, TA x 1, 5 yr recall   LAPAROTOMY N/A 02/05/2022   Procedure: EXPLORATORY LAPAROTOMY PROCTOSIGMOIDOSCOPY APPENDECTOMY;  Surgeon: Quentin Ore, MD;  Location: [facility];  Service: General;  Laterality: N/A;   POLYPECTOMY  2017   TA x 1   TONSILLECTOMY  1957    Current Outpatient Medications  Medication Sig Dispense Refill   atenolol (TENORMIN) 25 MG tablet Take 0.5 tablets (12.5 mg total) by mouth daily. 90 tablet 1   atorvastatin (LIPITOR) 40 MG tablet TAKE 1 TABLET BY MOUTH EVERYDAY AT BEDTIME 90 tablet 3   Calcium Carb-Cholecalciferol (CALCIUM 1000 + D PO) Take 2 tablets by mouth daily.     [facility]S SODIUM CHLORIDE 5 % ophthalmic solution Place 1 drop into both eyes as needed for eye irritation.     estradiol (ESTRACE) 0.1 MG/GM vaginal cream Place 1 Applicatorful vaginally 3 (three) times a week. 42.5 g 5   fluticasone (FLONASE) 50 MCG/ACT nasal spray Place 2 sprays into both nostrils daily.     GLUCOSAMINE-CHONDROITIN PO Take 2 capsules by mouth daily. 750-100//125-1.65     Lifitegrast (XIIDRA) 5 % SOLN Apply 1 drop to eye in the morning and at bedtime. Bilateral eyes     methylcellulose (CITRUCEL) oral powder Take  1 packet by mouth daily.     mirabegron ER (MYRBETRIQ) 50 MG TB24 tablet Take 1 tablet (50 mg total) by mouth daily. 90 tablet 3   oxymetazoline (AFRIN) 0.05 % nasal spray Place 1 spray into both nostrils 2 (two) times daily as needed (congestion during flights).     Probiotic Product (PROBIOTIC PO) Take 1 tablet by mouth daily.     triamterene-hydrochlorothiazide (MAXZIDE-25) 37.5-25 MG tablet Take 1 tablet by mouth daily. 90 tablet 3   No current facility-administered medications for this visit.    Family History  Adopted: Yes  Problem Relation Age of Onset   Breast cancer Neg Hx     Review of Systems  Constitutional: Negative.   Genitourinary: Negative.     Exam:   BP (!) 140/90   Pulse 70   Ht 5\' 10"  (1.778 m) Comment: Reported  Wt 167 lb 3.2  oz (75.8 kg)   BMI 23.99 kg/m   Height: 5\' 10"  (177.8 cm) (Reported)  General appearance: alert, cooperative and appears stated age Breasts: normal appearance, no masses or tenderness Abdomen: soft, non-tender; bowel sounds normal; no masses,  no organomegaly, midline hernia present, non tender Lymph nodes: Cervical, supraclavicular, and axillary nodes normal.  No abnormal inguinal nodes palpated Neurologic: Grossly normal  Pelvic: External genitalia:  no lesions              Urethra:  normal appearing urethra with no masses, tenderness or lesions              Bartholins and Skenes: normal                 Vagina: normal appearing vagina with atrophic changes and no discharge, no lesions              Cervix: no lesions              Pap taken: No. Bimanual Exam:  Uterus:  normal size, contour, position, consistency, mobility, non-tender              Adnexa: normal adnexa and no mass, fullness, tenderness               Rectovaginal: Confirms               Anus:  normal sphincter tone, no lesions  Chaperone, Ina Homes, CMA, was present for exam.  Assessment/Plan: 1. Encntr for gyn exam (general) (routine) w/o abn findings - Pap smear neg with +HR HPV 2023.  Repeated today - Mammogram 03/01/2023 - Colonoscopy 09/07/2021 - Bone mineral density ordered - lab work done with PCP, Dr. Casimiro Needle - vaccines reviewed/updated  2. History of loop electrical excision procedure (LEEP)  3. High risk HPV infection - Cytology - PAP( )  4. Osteoporosis screening - BMD ordered - DG BONE DENSITY (DXA); Future  5. Hypoestrogenism  6. Vaginal atrophy - estradiol (ESTRACE) 0.1 MG/GM vaginal cream; Place 1 Applicatorful vaginally 3 (three) times a week.  Dispense: 42.5 g; Refill: 3  7. Other specified abdominal hernia without obstruction or gangrene - has surgery planned in early August

## 2023-05-17 LAB — CYTOLOGY - PAP
Comment: NEGATIVE
Diagnosis: NEGATIVE
High risk HPV: POSITIVE — AB

## 2023-05-30 ENCOUNTER — Ambulatory Visit (HOSPITAL_BASED_OUTPATIENT_CLINIC_OR_DEPARTMENT_OTHER)
Admission: RE | Admit: 2023-05-30 | Discharge: 2023-05-30 | Disposition: A | Discharge status: Home / Self Care | Payer: Medicare Other | Source: Ambulatory Visit | Attending: Obstetrics & Gynecology | Admitting: Obstetrics & Gynecology

## 2023-05-30 DIAGNOSIS — E2839 Other primary ovarian failure: Secondary | ICD-10-CM | POA: Insufficient documentation

## 2023-05-30 DIAGNOSIS — Z13820 Encounter for screening for osteoporosis: Secondary | ICD-10-CM | POA: Diagnosis present

## 2023-06-04 ENCOUNTER — Ambulatory Visit (INDEPENDENT_AMBULATORY_CARE_PROVIDER_SITE_OTHER): Payer: Medicare Other | Admitting: Family Medicine

## 2023-06-04 ENCOUNTER — Encounter: Payer: Self-pay | Admitting: Family Medicine

## 2023-06-04 VITALS — BP 130/88 | HR 69 | Temp 98.3°F | Ht 70.0 in | Wt 164.6 lb

## 2023-06-04 DIAGNOSIS — I10 Essential (primary) hypertension: Secondary | ICD-10-CM

## 2023-06-04 LAB — BASIC METABOLIC PANEL WITH GFR
BUN: 23 mg/dL (ref 6–23)
CO2: 27 meq/L (ref 19–32)
Calcium: 10.4 mg/dL (ref 8.4–10.5)
Chloride: 97 meq/L (ref 96–112)
Creatinine, Ser: 1.05 mg/dL (ref 0.40–1.20)
GFR: 52.54 mL/min — ABNORMAL LOW (ref >60.00)
Glucose, Bld: 96 mg/dL (ref 70–99)
Potassium: 4.1 meq/L (ref 3.5–5.1)
Sodium: 133 meq/L — ABNORMAL LOW (ref 135–145)

## 2023-06-04 MED ORDER — ATENOLOL 25 MG PO TABS: 12.5000 mg | ORAL_TABLET | Freq: Every day | ORAL | 1 refills | Status: DC

## 2023-06-04 NOTE — Progress Notes (Signed)
Established Patient Office Visit  Subjective   Patient ID: Caitlin Bowers, female    DOB: 08/23/1951  Age: 72 y.o. MRN: [id number]  Chief Complaint  Patient presents with   Medical Management of Chronic Issues    Patient is here for follow up today. She reports she is getting hernia repair surgery in August. States that she is otherwise in her normal states of health. No other new symptoms or issues to report today. We reviewed her medications, needs refills on the atenolol.  HTN -- BP in office performed and is well controlled. She  reports no side effects to the medications, no chest pain, SOB, dizziness or headaches. She has a BP cuff at home and is checking BP regularly, reports they are in the normal range.     Current Outpatient Medications  Medication Instructions   atenolol (TENORMIN) 12.5 mg, Oral, Daily   atorvastatin (LIPITOR) 40 MG tablet TAKE 1 TABLET BY MOUTH EVERYDAY AT BEDTIME   Calcium Carb-Cholecalciferol (CALCIUM 1000 + D PO) 2 tablets, Oral, Daily   estradiol (ESTRACE) 0.1 MG/GM vaginal cream 1 Applicatorful, Vaginal, 3 times weekly   fluticasone (FLONASE) 50 MCG/ACT nasal spray 2 sprays, Each Nare, Daily   GLUCOSAMINE-CHONDROITIN PO 2 capsules, Oral, Daily, 750-100//125-1.65    Lifitegrast (XIIDRA) 5 % SOLN 1 drop, Ophthalmic, 2 times daily, Bilateral eyes   methylcellulose (CITRUCEL) oral powder 1 packet, Oral, Daily   mirabegron ER (MYRBETRIQ) 50 mg, Oral, Daily   Probiotic Product (PROBIOTIC PO) 1 tablet, Oral, Daily   triamterene-hydrochlorothiazide (MAXZIDE-25) 37.5-25 MG tablet 1 tablet, Oral, Daily    Patient Active Problem List   Diagnosis Date Noted   Graves disease 12/12/2022   Intramural uterine fibroid 04/30/2022   History of appendectomy 02/13/2022   Pelvic abscess in female 02/13/2022   History of exploratory laparotomy 02/06/2022   Urinary urgency 11/05/2019   Hyperlipidemia 11/05/2019   Hypertension 11/05/2019   History of  Graves' disease 11/05/2019   History of HPV infection 09/09/2018      Review of Systems  All other systems reviewed and are negative.     Objective:     BP 130/88 (BP Location: Left Arm, Patient Position: Sitting, Cuff Size: Normal)   Pulse 69   Temp 98.3 F (36.8 C) (Oral)   Ht 5\' 10"  (1.778 m)   Wt 164 lb 9.6 oz (74.7 kg)   SpO2 98%   BMI 23.62 kg/m    Physical Exam Vitals reviewed.  Constitutional:      Appearance: Normal appearance. She is well-groomed and normal weight.  Eyes:     Conjunctiva/sclera: Conjunctivae normal.  Neck:     Thyroid: No thyromegaly.  Cardiovascular:     Rate and Rhythm: Normal rate and regular rhythm.     Pulses: Normal pulses.     Heart sounds: S1 normal and S2 normal.  Pulmonary:     Effort: Pulmonary effort is normal.     Breath sounds: Normal breath sounds and air entry.  Abdominal:     General: Bowel sounds are normal.  Musculoskeletal:     Right lower leg: No edema.     Left lower leg: No edema.  Neurological:     Mental Status: She is alert and oriented to person, place, and time. Mental status is at baseline.     Gait: Gait is intact.  Psychiatric:        Mood and Affect: Mood and affect normal.        Speech:  Speech normal.        Behavior: Behavior normal.        Judgment: Judgment normal.      No results found for any visits on 06/04/23.    The 10-year ASCVD risk score (Arnett DK, et al., 2019) is: 17.2%    Assessment & Plan:  Hypertension, unspecified type Assessment & Plan: Current hypertension medications:       Sig   triamterene-hydrochlorothiazide (MAXZIDE-25) 37.5-25 MG tablet (Taking) Take 1 tablet by mouth daily.   atenolol (TENORMIN) 25 MG tablet Take 0.5 tablets (12.5 mg total) by mouth daily.      BP is well controlled today. Will continue the above medications as prescribed. RTC 6 months.   Orders: -     Atenolol; Take 0.5 tablets (12.5 mg total) by mouth daily.  Dispense: 90 tablet;  Refill: 1 -     Basic metabolic panel     Return in about 29 weeks (around 12/24/2023) for AWV-- I will do the visit instead of Meriam Sprague-- please put her on my schedule for her AWV.    Karie Georges, MD

## 2023-06-04 NOTE — Assessment & Plan Note (Signed)
Current hypertension medications:       Sig   triamterene-hydrochlorothiazide (MAXZIDE-25) 37.5-25 MG tablet (Taking) Take 1 tablet by mouth daily.   atenolol (TENORMIN) 25 MG tablet Take 0.5 tablets (12.5 mg total) by mouth daily.      BP is well controlled today. Will continue the above medications as prescribed. RTC 6 months.

## 2023-06-25 NOTE — Progress Notes (Signed)
Sent message, via epic in basket, requesting orders in epic from surgeon.  

## 2023-06-26 ENCOUNTER — Ambulatory Visit: Payer: Self-pay | Admitting: Surgery

## 2023-06-30 NOTE — Patient Instructions (Signed)
SURGICAL WAITING ROOM VISITATION Patients having surgery or a procedure may have no more than 2 support people in the waiting area - these visitors may rotate in the visitor waiting room.   Due to an increase in RSV and influenza rates and associated hospitalizations, children ages 24 and under may not visit patients in [facility] hospitals. If the patient needs to stay at the hospital during part of their recovery, the visitor guidelines for inpatient rooms apply.  PRE-OP VISITATION  Pre-op nurse will coordinate an appropriate time for 1 support person to accompany the patient in pre-op.  This support person may not rotate.  This visitor will be contacted when the time is appropriate for the visitor to come back in the pre-op area.  Please refer to the [facility] website for the visitor guidelines for Inpatients (after your surgery is over and you are in a regular room).  You are not required to quarantine at this time prior to your surgery. However, you must do this: Hand Hygiene often Do NOT share personal items Notify your provider if you are in close contact with someone who has COVID or you develop fever 100.4 or greater, new onset of sneezing, cough, sore throat, shortness of breath or body aches.  If you test positive for Covid or have been in contact with anyone that has tested positive in the last 10 days please notify you surgeon.    Your procedure is scheduled on:  Friday  July 06, 2023  Report to [facility] Main Entrance: [address] entrance where the [employer] is available.   Report to admitting at:  05:15   AM  Call this number if you have any questions or problems the morning of surgery [phone]  Do not eat food after Midnight the night prior to your surgery/procedure.  After Midnight you may have the following liquids until  04:30  AM  DAY OF SURGERY  Clear Liquid Diet Water Black Coffee (sugar ok, NO MILK/CREAM OR CREAMERS)  Tea (sugar ok, NO  MILK/CREAM OR CREAMERS) regular and decaf                             Plain Jell-O  with no fruit (NO RED)                                           Fruit ices (not with fruit pulp, NO RED)                                     Popsicles (NO RED)                                                                  Juice: NO CITRUS JUICES: only apple, WHITE grape, WHITE cranberry Sports drinks like Gatorade or Powerade (NO RED)                 FOLLOW BOWEL PREP AND ANY ADDITIONAL PRE OP INSTRUCTIONS YOU RECEIVED FROM YOUR SURGEON'S OFFICE!!!   Oral Hygiene is  also important to reduce your risk of infection.        Remember - BRUSH YOUR TEETH THE MORNING OF SURGERY WITH YOUR REGULAR TOOTHPASTE  Do NOT smoke after Midnight the night before surgery.  STOP TAKING all Vitamins, Herbs and supplements 1 week before your surgery.   Take ONLY these medicines the morning of surgery with A SIP OF WATER: Mirabegron (Myrbetriq), Atenolol,  and you may use your Fluticasone (Flonase Nasal spray).                     You may not have any metal on your body including hair pins, jewelry, and body piercing  Do not wear make-up, lotions, powders, perfumes  or deodorant  Do not wear nail polish including gel and S&S, artificial / acrylic nails, or any other type of covering on natural nails including finger and toenails. If you have artificial nails, gel coating, etc., that needs to be removed by a nail salon, Please have this removed prior to surgery. Not doing so may mean that your surgery could be cancelled or delayed if the Surgeon or anesthesia staff feels like they are unable to monitor you safely.   Do not shave 48 hours prior to surgery to avoid nicks in your skin which may contribute to postoperative infections.    Patients discharged on the day of surgery will not be allowed to drive home.  Someone NEEDS to stay with you for the first 24 hours after anesthesia.  Do not bring your home medications to  the hospital. The Pharmacy will dispense medications listed on your medication list to you during your admission in the Hospital.  Special Instructions: Bring a copy of your healthcare power of attorney and living will documents the day of surgery, if you wish to have them scanned into your [facility] Records- EPIC  Please read over the following fact sheets you were given: IF YOU HAVE QUESTIONS ABOUT YOUR PRE-OP INSTRUCTIONS, PLEASE CALL [phone]   [facility] - Preparing for Surgery Before surgery, you can play an important role.  Because skin is not sterile, your skin needs to be as free of germs as possible.  You can reduce the number of germs on your skin by washing with CHG (chlorahexidine gluconate) soap before surgery.  CHG is an antiseptic cleaner which kills germs and bonds with the skin to continue killing germs even after washing. Please DO NOT use if you have an allergy to CHG or antibacterial soaps.  If your skin becomes reddened/irritated stop using the CHG and inform your nurse when you arrive at Short Stay. Do not shave (including legs and underarms) for at least 48 hours prior to the first CHG shower.  You may shave your face/neck.  Please follow these instructions carefully:  1.  Shower with CHG Soap the night before surgery and the  morning of surgery.  2.  If you choose to wash your hair, wash your hair first as usual with your normal  shampoo.  3.  After you shampoo, rinse your hair and body thoroughly to remove the shampoo.                             4.  Use CHG as you would any other liquid soap.  You can apply chg directly to the skin and wash.  Gently with a scrungie or clean washcloth.  5.  Apply the CHG Soap to  your body ONLY FROM THE NECK DOWN.   Do not use on face/ open                           Wound or open sores. Avoid contact with eyes, ears mouth and genitals (private parts).                       Wash face,  Genitals (private parts) with your normal  soap.             6.  Wash thoroughly, paying special attention to the area where your  surgery  will be performed.  7.  Thoroughly rinse your body with warm water from the neck down.  8.  DO NOT shower/wash with your normal soap after using and rinsing off the CHG Soap.            9.  Pat yourself dry with a clean towel.            10.  Wear clean pajamas.            11.  Place clean sheets on your bed the night of your first shower and do not  sleep with pets.  ON THE DAY OF SURGERY : Do not apply any lotions/deodorants the morning of surgery.  Please wear clean clothes to the hospital/surgery center.     FAILURE TO FOLLOW THESE INSTRUCTIONS MAY RESULT IN THE CANCELLATION OF YOUR SURGERY  PATIENT SIGNATURE_________________________________  NURSE SIGNATURE__________________________________  ________________________________________________________________________

## 2023-06-30 NOTE — Progress Notes (Addendum)
COVID Vaccine received:  []  No [x]  Yes Date of any COVID positive Test in last 90 days:  None  PCP - Nira Conn, MD Cardiologist - None  Chest x-ray - 11-12-2022  Epic EKG - 02-07-22   will repeat at PST  Stress Test -  ECHO -  Cardiac Cath -   PCR screen: []  Ordered & Completed           []   No Order but Needs PROFEND           [x]   N/A for this surgery  Surgery Plan:  [x]  Ambulatory                            []  Outpatient in bed                            []  Admit  Anesthesia:    [x]  General  []  Spinal                           []   Choice []   MAC  Bowel Prep - [x]  No  []   Yes ______  Pacemaker / ICD device [x]  No []  Yes   Spinal Cord Stimulator:[x]  No []  Yes       History of Sleep Apnea? [x]  No []  Yes   CPAP used?- [x]  No []  Yes    Does the patient monitor blood sugar?          []  No []  Yes  [x]  N/A  Patient has: [x]  NO Hx DM   []  Pre-DM                 []  DM1  []   DM2  Blood Thinner / Instructions:  none Aspirin Instructions:  none  ERAS Protocol Ordered: []  No  [x]  Yes PRE-SURGERY []  ENSURE  []  G2  [x]  No Drink Ordered Patient is to be NPO after:   04:30  am  Activity level: Patient is able to climb a flight of stairs without difficulty; [x]  No CP  [x]  No SOB.   Patient can perform ADLs without assistance.   Anesthesia review: Patient is adopted,  HTN, Grave's disease (no meds), CKD2  Patient denies shortness of breath, fever, cough and chest pain at PAT appointment.  Patient verbalized understanding and agreement to the Pre-Surgical Instructions that were given to them at this PAT appointment. Patient was also educated of the need to review these PAT instructions again prior to her surgery.I reviewed the appropriate phone numbers to call if they have any and questions or concerns.

## 2023-07-02 ENCOUNTER — Encounter (HOSPITAL_COMMUNITY)
Admission: RE | Admit: 2023-07-02 | Discharge: 2023-07-02 | Disposition: A | Discharge status: Home / Self Care | Payer: Medicare Other | Source: Ambulatory Visit | Attending: Surgery | Admitting: Surgery

## 2023-07-02 ENCOUNTER — Encounter (HOSPITAL_COMMUNITY): Payer: Self-pay

## 2023-07-02 ENCOUNTER — Other Ambulatory Visit: Payer: Self-pay

## 2023-07-02 VITALS — BP 138/80 | HR 74 | Temp 98.7°F | Resp 16 | Ht 70.0 in | Wt 160.0 lb

## 2023-07-02 DIAGNOSIS — I10 Essential (primary) hypertension: Secondary | ICD-10-CM | POA: Diagnosis not present

## 2023-07-02 DIAGNOSIS — Z01818 Encounter for other preprocedural examination: Secondary | ICD-10-CM | POA: Insufficient documentation

## 2023-07-02 HISTORY — DX: Malignant (primary) neoplasm, unspecified: C80.1

## 2023-07-02 LAB — BASIC METABOLIC PANEL
Anion gap: 10 (ref 5–15)
BUN: 19 mg/dL (ref 8–23)
CO2: 25 mmol/L (ref 22–32)
Calcium: 9.8 mg/dL (ref 8.9–10.3)
Chloride: 98 mmol/L (ref 98–111)
Creatinine, Ser: 1.01 mg/dL — ABNORMAL HIGH (ref 0.44–1.00)
GFR, Estimated: 58 mL/min — ABNORMAL LOW (ref >60)
Glucose, Bld: 100 mg/dL — ABNORMAL HIGH (ref 70–99)
Potassium: 4 mmol/L (ref 3.5–5.1)
Sodium: 133 mmol/L — ABNORMAL LOW (ref 135–145)

## 2023-07-02 LAB — CBC
HCT: 38.8 % (ref 36.0–46.0)
Hemoglobin: 12.7 g/dL (ref 12.0–15.0)
MCH: 31.1 pg (ref 26.0–34.0)
MCHC: 32.7 g/dL (ref 30.0–36.0)
MCV: 94.9 fL (ref 80.0–100.0)
Platelets: 204 10*3/uL (ref 150–400)
RBC: 4.09 MIL/uL (ref 3.87–5.11)
RDW: 13 % (ref 11.5–15.5)
WBC: 5.5 10*3/uL (ref 4.0–10.5)
nRBC: 0 % (ref 0.0–0.2)

## 2023-07-05 NOTE — Anesthesia Preprocedure Evaluation (Addendum)
Anesthesia Evaluation  Patient identified by MRN, date of birth, ID band Patient awake    Reviewed: Allergy & Precautions, NPO status , Patient's Chart, lab work & pertinent test results  History of Anesthesia Complications Negative for: history of anesthetic complications  Airway Mallampati: III  TM Distance: >3 FB Neck ROM: Full   Comment: Previous grade I view with MAC 3 Dental  (+) Dental Advisory Given   Pulmonary neg pulmonary ROS   Pulmonary exam normal breath sounds clear to auscultation       Cardiovascular hypertension (atenolol, triamterene-HCTZ), Pt. on home beta blockers (-) angina (-) Past MI, (-) Cardiac Stents and (-) CABG (-) dysrhythmias  Rhythm:Regular Rate:Normal  HLD   Neuro/Psych negative neurological ROS     GI/Hepatic negative GI ROS, Neg liver ROS,,,  Endo/Other  neg diabetes Hyperthyroidism (Graves disease, in remission)   Renal/GU negative Renal ROS     Musculoskeletal  (+) Arthritis ,    Abdominal   Peds  Hematology negative hematology ROS (+) Lab Results      Component                Value               Date                      WBC                      5.5                 07/02/2023                HGB                      12.7                07/02/2023                HCT                      38.8                07/02/2023                MCV                      94.9                07/02/2023                PLT                      204                 07/02/2023              Anesthesia Other Findings   Reproductive/Obstetrics                             Anesthesia Physical Anesthesia Plan  ASA: 2  Anesthesia Plan: General   Post-op Pain Management: Tylenol PO (pre-op)*   Induction: Intravenous  PONV Risk Score and Plan: 3 and Ondansetron, Dexamethasone and Treatment may vary due to age or medical condition  Airway Management Planned: Oral  ETT  Additional Equipment:   Intra-op Plan:   Post-operative Plan:  Extubation in OR  Informed Consent: I have reviewed the patients History and Physical, chart, labs and discussed the procedure including the risks, benefits and alternatives for the proposed anesthesia with the patient or authorized representative who has indicated his/her understanding and acceptance.     Dental advisory given  Plan Discussed with: CRNA and Anesthesiologist  Anesthesia Plan Comments: (Risks of general anesthesia discussed including, but not limited to, sore throat, hoarse voice, chipped/damaged teeth, injury to vocal cords, nausea and vomiting, allergic reactions, lung infection, heart attack, stroke, and death. All questions answered. )        Anesthesia Quick Evaluation

## 2023-07-06 ENCOUNTER — Ambulatory Visit (HOSPITAL_BASED_OUTPATIENT_CLINIC_OR_DEPARTMENT_OTHER): Payer: Medicare Other | Admitting: Anesthesiology

## 2023-07-06 ENCOUNTER — Ambulatory Visit (HOSPITAL_COMMUNITY): Payer: Medicare Other | Admitting: Anesthesiology

## 2023-07-06 ENCOUNTER — Other Ambulatory Visit: Payer: Self-pay

## 2023-07-06 ENCOUNTER — Encounter (HOSPITAL_COMMUNITY): Payer: Self-pay | Admitting: Surgery

## 2023-07-06 ENCOUNTER — Ambulatory Visit (HOSPITAL_COMMUNITY)
Admission: RE | Admit: 2023-07-06 | Discharge: 2023-07-07 | Disposition: A | Discharge status: Home / Self Care | Payer: Medicare Other | Attending: Surgery | Admitting: Surgery

## 2023-07-06 ENCOUNTER — Encounter (HOSPITAL_COMMUNITY)
Admission: RE | Disposition: A | Discharge status: Home / Self Care | Payer: Self-pay | Source: Home / Self Care | Attending: Surgery

## 2023-07-06 DIAGNOSIS — E785 Hyperlipidemia, unspecified: Secondary | ICD-10-CM | POA: Insufficient documentation

## 2023-07-06 DIAGNOSIS — M1990 Unspecified osteoarthritis, unspecified site: Secondary | ICD-10-CM | POA: Diagnosis not present

## 2023-07-06 DIAGNOSIS — K4020 Bilateral inguinal hernia, without obstruction or gangrene, not specified as recurrent: Secondary | ICD-10-CM | POA: Diagnosis not present

## 2023-07-06 DIAGNOSIS — I10 Essential (primary) hypertension: Secondary | ICD-10-CM | POA: Insufficient documentation

## 2023-07-06 DIAGNOSIS — Z79899 Other long term (current) drug therapy: Secondary | ICD-10-CM | POA: Insufficient documentation

## 2023-07-06 DIAGNOSIS — E0500 Thyrotoxicosis with diffuse goiter without thyrotoxic crisis or storm: Secondary | ICD-10-CM | POA: Diagnosis not present

## 2023-07-06 DIAGNOSIS — K432 Incisional hernia without obstruction or gangrene: Secondary | ICD-10-CM | POA: Diagnosis present

## 2023-07-06 HISTORY — PX: XI ROBOTIC ASSISTED VENTRAL HERNIA: SHX6789

## 2023-07-06 LAB — CBC
HCT: 34.5 % — ABNORMAL LOW (ref 36.0–46.0)
Hemoglobin: 11.1 g/dL — ABNORMAL LOW (ref 12.0–15.0)
MCH: 31.5 pg (ref 26.0–34.0)
MCHC: 32.2 g/dL (ref 30.0–36.0)
MCV: 98 fL (ref 80.0–100.0)
Platelets: 183 10*3/uL (ref 150–400)
RBC: 3.52 MIL/uL — ABNORMAL LOW (ref 3.87–5.11)
RDW: 13 % (ref 11.5–15.5)
WBC: 10.9 10*3/uL — ABNORMAL HIGH (ref 4.0–10.5)
nRBC: 0 % (ref 0.0–0.2)

## 2023-07-06 LAB — CREATININE, SERUM
Creatinine, Ser: 1.09 mg/dL — ABNORMAL HIGH (ref 0.44–1.00)
GFR, Estimated: 53 mL/min — ABNORMAL LOW (ref >60)

## 2023-07-06 SURGERY — REPAIR, HERNIA, VENTRAL, ROBOT-ASSISTED
Anesthesia: General | Site: Abdomen

## 2023-07-06 MED ORDER — DEXAMETHASONE SODIUM PHOSPHATE 10 MG/ML IJ SOLN
INTRAMUSCULAR | Status: DC | PRN
  Administered 2023-07-06: 5 mg via INTRAVENOUS

## 2023-07-06 MED ORDER — PROPOFOL 10 MG/ML IV BOLUS
INTRAVENOUS | Status: DC | PRN
  Administered 2023-07-06: 150 mg via INTRAVENOUS

## 2023-07-06 MED ORDER — PROCHLORPERAZINE EDISYLATE 10 MG/2ML IJ SOLN: 10.0000 mg | INTRAMUSCULAR | Status: DC | PRN

## 2023-07-06 MED ORDER — BUPIVACAINE LIPOSOME 1.3 % IJ SUSP
INTRAMUSCULAR | Status: AC
  Filled 2023-07-06: qty 20

## 2023-07-06 MED ORDER — SIMETHICONE 80 MG PO CHEW: 80.0000 mg | CHEWABLE_TABLET | Freq: Four times a day (QID) | ORAL | Status: DC | PRN

## 2023-07-06 MED ORDER — ENOXAPARIN SODIUM 40 MG/0.4ML IJ SOSY
40.0000 mg | PREFILLED_SYRINGE | Freq: Once | INTRAMUSCULAR | Status: AC
  Administered 2023-07-06: 40 mg via SUBCUTANEOUS
  Filled 2023-07-06: qty 0.4

## 2023-07-06 MED ORDER — TRIAMTERENE-HCTZ 37.5-25 MG PO TABS
1.0000 | ORAL_TABLET | Freq: Every day | ORAL | Status: DC
  Administered 2023-07-07: 1 via ORAL
  Filled 2023-07-06: qty 1

## 2023-07-06 MED ORDER — NUTRISOURCE FIBER PO PACK
1.0000 | PACK | Freq: Every day | ORAL | Status: DC
  Administered 2023-07-07: 1 via ORAL
  Filled 2023-07-06: qty 1

## 2023-07-06 MED ORDER — BUPIVACAINE-EPINEPHRINE 0.25% -1:200000 IJ SOLN
INTRAMUSCULAR | Status: DC | PRN
  Administered 2023-07-06: 30 mL

## 2023-07-06 MED ORDER — BUPIVACAINE LIPOSOME 1.3 % IJ SUSP
INTRAMUSCULAR | Status: DC | PRN
  Administered 2023-07-06: 20 mL

## 2023-07-06 MED ORDER — HYDROMORPHONE HCL 1 MG/ML IJ SOLN: 0.5000 mg | INTRAMUSCULAR | Status: DC | PRN

## 2023-07-06 MED ORDER — LIFITEGRAST 5 % OP SOLN: 1.0000 [drp] | Freq: Every evening | OPHTHALMIC | Status: DC

## 2023-07-06 MED ORDER — ACETAMINOPHEN 500 MG PO TABS
1000.0000 mg | ORAL_TABLET | ORAL | Status: AC
  Administered 2023-07-06: 1000 mg via ORAL
  Filled 2023-07-06: qty 2

## 2023-07-06 MED ORDER — METHOCARBAMOL 1000 MG/10ML IJ SOLN: 500.0000 mg | Freq: Four times a day (QID) | INTRAVENOUS | Status: DC | PRN

## 2023-07-06 MED ORDER — CHLORHEXIDINE GLUCONATE CLOTH 2 % EX PADS: 6.0000 | MEDICATED_PAD | Freq: Once | CUTANEOUS | Status: DC

## 2023-07-06 MED ORDER — FLUTICASONE PROPIONATE 50 MCG/ACT NA SUSP
1.0000 | Freq: Two times a day (BID) | NASAL | Status: DC
  Administered 2023-07-06 – 2023-07-07 (×2): 1 via NASAL
  Filled 2023-07-06: qty 16

## 2023-07-06 MED ORDER — OXYCODONE-ACETAMINOPHEN 5-325 MG PO TABS: 1.0000 | ORAL_TABLET | ORAL | 0 refills | Status: DC | PRN

## 2023-07-06 MED ORDER — LIDOCAINE HCL (CARDIAC) PF 100 MG/5ML IV SOSY
PREFILLED_SYRINGE | INTRAVENOUS | Status: DC | PRN
  Administered 2023-07-06: 50 mg via INTRAVENOUS

## 2023-07-06 MED ORDER — GABAPENTIN 100 MG PO CAPS: 100.0000 mg | ORAL_CAPSULE | Freq: Three times a day (TID) | ORAL | 2 refills | Status: DC | PRN

## 2023-07-06 MED ORDER — KETOROLAC TROMETHAMINE 15 MG/ML IJ SOLN
15.0000 mg | INTRAMUSCULAR | Status: AC
  Administered 2023-07-06: 15 mg via INTRAVENOUS
  Filled 2023-07-06: qty 1

## 2023-07-06 MED ORDER — LIDOCAINE HCL (PF) 2 % IJ SOLN
INTRAMUSCULAR | Status: DC | PRN
Start: 2023-07-06 — End: 2023-07-06
  Administered 2023-07-06: 1.5 mg/kg/h via INTRADERMAL

## 2023-07-06 MED ORDER — HYDROMORPHONE HCL 1 MG/ML IJ SOLN
INTRAMUSCULAR | Status: DC | PRN
Start: 2023-07-06 — End: 2023-07-06
  Administered 2023-07-06 (×2): .5 mg via INTRAVENOUS

## 2023-07-06 MED ORDER — OXYCODONE HCL 5 MG PO TABS: 5.0000 mg | ORAL_TABLET | ORAL | Status: DC | PRN

## 2023-07-06 MED ORDER — MIDAZOLAM HCL 5 MG/5ML IJ SOLN
INTRAMUSCULAR | Status: DC | PRN
  Administered 2023-07-06: 2 mg via INTRAVENOUS

## 2023-07-06 MED ORDER — DOCUSATE SODIUM 100 MG PO CAPS
100.0000 mg | ORAL_CAPSULE | Freq: Two times a day (BID) | ORAL | Status: DC
  Administered 2023-07-06 – 2023-07-07 (×2): 100 mg via ORAL
  Filled 2023-07-06 (×2): qty 1

## 2023-07-06 MED ORDER — GABAPENTIN 300 MG PO CAPS
300.0000 mg | ORAL_CAPSULE | ORAL | Status: AC
  Administered 2023-07-06: 300 mg via ORAL
  Filled 2023-07-06: qty 1

## 2023-07-06 MED ORDER — ONDANSETRON HCL 4 MG/2ML IJ SOLN
4.0000 mg | Freq: Four times a day (QID) | INTRAMUSCULAR | Status: DC | PRN
  Administered 2023-07-06: 4 mg via INTRAVENOUS
  Filled 2023-07-06: qty 2

## 2023-07-06 MED ORDER — ROCURONIUM BROMIDE 100 MG/10ML IV SOLN
INTRAVENOUS | Status: DC | PRN
  Administered 2023-07-06: 20 mg via INTRAVENOUS
  Administered 2023-07-06: 50 mg via INTRAVENOUS

## 2023-07-06 MED ORDER — 0.9 % SODIUM CHLORIDE (POUR BTL) OPTIME
TOPICAL | Status: DC | PRN
  Administered 2023-07-06: 1000 mL

## 2023-07-06 MED ORDER — CLINDAMYCIN PHOSPHATE 900 MG/50ML IV SOLN
900.0000 mg | INTRAVENOUS | Status: AC
  Administered 2023-07-06: 900 mg via INTRAVENOUS
  Filled 2023-07-06: qty 50

## 2023-07-06 MED ORDER — FENTANYL CITRATE (PF) 100 MCG/2ML IJ SOLN
INTRAMUSCULAR | Status: DC | PRN
  Administered 2023-07-06: 100 ug via INTRAVENOUS

## 2023-07-06 MED ORDER — ATORVASTATIN CALCIUM 20 MG PO TABS
40.0000 mg | ORAL_TABLET | Freq: Every day | ORAL | Status: DC
  Administered 2023-07-06: 40 mg via ORAL
  Filled 2023-07-06: qty 2

## 2023-07-06 MED ORDER — ENOXAPARIN SODIUM 40 MG/0.4ML IJ SOSY
40.0000 mg | PREFILLED_SYRINGE | INTRAMUSCULAR | Status: DC
  Administered 2023-07-07: 40 mg via SUBCUTANEOUS
  Filled 2023-07-06: qty 0.4

## 2023-07-06 MED ORDER — OXYCODONE HCL 5 MG PO TABS: 10.0000 mg | ORAL_TABLET | ORAL | Status: DC | PRN

## 2023-07-06 MED ORDER — BUPIVACAINE LIPOSOME 1.3 % IJ SUSP: 20.0000 mL | Freq: Once | INTRAMUSCULAR | Status: DC

## 2023-07-06 MED ORDER — GABAPENTIN 300 MG PO CAPS
300.0000 mg | ORAL_CAPSULE | Freq: Three times a day (TID) | ORAL | Status: DC
  Administered 2023-07-06 – 2023-07-07 (×3): 300 mg via ORAL
  Filled 2023-07-06 (×3): qty 1

## 2023-07-06 MED ORDER — BUPIVACAINE-EPINEPHRINE 0.25% -1:200000 IJ SOLN
INTRAMUSCULAR | Status: AC
  Filled 2023-07-06: qty 1

## 2023-07-06 MED ORDER — CHLORHEXIDINE GLUCONATE 0.12 % MT SOLN
15.0000 mL | Freq: Once | OROMUCOSAL | Status: AC
  Administered 2023-07-06: 15 mL via OROMUCOSAL

## 2023-07-06 MED ORDER — ORAL CARE MOUTH RINSE: 15.0000 mL | Freq: Once | OROMUCOSAL | Status: AC

## 2023-07-06 MED ORDER — OXYCODONE HCL 5 MG/5ML PO SOLN: 5.0000 mg | Freq: Once | ORAL | Status: DC | PRN

## 2023-07-06 MED ORDER — ONDANSETRON HCL 4 MG/2ML IJ SOLN
INTRAMUSCULAR | Status: DC | PRN
  Administered 2023-07-06: 4 mg via INTRAVENOUS

## 2023-07-06 MED ORDER — AMISULPRIDE (ANTIEMETIC) 5 MG/2ML IV SOLN: 10.0000 mg | Freq: Once | INTRAVENOUS | Status: DC | PRN

## 2023-07-06 MED ORDER — FENTANYL CITRATE PF 50 MCG/ML IJ SOSY: 25.0000 ug | PREFILLED_SYRINGE | INTRAMUSCULAR | Status: DC | PRN

## 2023-07-06 MED ORDER — ACETAMINOPHEN 325 MG PO TABS
650.0000 mg | ORAL_TABLET | Freq: Four times a day (QID) | ORAL | Status: DC
  Administered 2023-07-06 – 2023-07-07 (×4): 650 mg via ORAL
  Filled 2023-07-06 (×4): qty 2

## 2023-07-06 MED ORDER — MIRABEGRON ER 25 MG PO TB24
50.0000 mg | ORAL_TABLET | Freq: Every day | ORAL | Status: DC
  Administered 2023-07-07: 50 mg via ORAL
  Filled 2023-07-06: qty 2

## 2023-07-06 MED ORDER — EPHEDRINE SULFATE (PRESSORS) 50 MG/ML IJ SOLN
INTRAMUSCULAR | Status: DC | PRN
Start: 2023-07-06 — End: 2023-07-06
  Administered 2023-07-06: 5 mg via INTRAVENOUS
  Administered 2023-07-06: 7.5 mg via INTRAVENOUS
  Administered 2023-07-06: 5 mg via INTRAVENOUS
  Administered 2023-07-06: 12.5 mg via INTRAVENOUS

## 2023-07-06 MED ORDER — METHOCARBAMOL 750 MG PO TABS: 750.0000 mg | ORAL_TABLET | Freq: Four times a day (QID) | ORAL | 0 refills | Status: DC | PRN

## 2023-07-06 MED ORDER — ATENOLOL 25 MG PO TABS
12.5000 mg | ORAL_TABLET | Freq: Every day | ORAL | Status: DC
  Administered 2023-07-07: 12.5 mg via ORAL
  Filled 2023-07-06: qty 1

## 2023-07-06 MED ORDER — OXYCODONE HCL 5 MG PO TABS: 5.0000 mg | ORAL_TABLET | Freq: Once | ORAL | Status: DC | PRN

## 2023-07-06 MED ORDER — LACTATED RINGERS IV SOLN: INTRAVENOUS | Status: DC

## 2023-07-06 MED ORDER — SUGAMMADEX SODIUM 200 MG/2ML IV SOLN
INTRAVENOUS | Status: DC | PRN
  Administered 2023-07-06: 200 mg via INTRAVENOUS

## 2023-07-06 SURGICAL SUPPLY — 64 items
ADH SKN CLS APL DERMABOND .7 (GAUZE/BANDAGES/DRESSINGS)
ANTIFOG SOL W/FOAM PAD STRL (MISCELLANEOUS) ×1
APL PRP STRL LF DISP 70% ISPRP (MISCELLANEOUS) ×1
BAG COUNTER SPONGE SURGICOUNT (BAG) ×1 IMPLANT
BAG SPNG CNTER NS LX DISP (BAG) ×1
BLADE SURG SZ11 CARB STEEL (BLADE) ×1 IMPLANT
CHLORAPREP W/TINT 26 (MISCELLANEOUS) ×1 IMPLANT
COVER MAYO STAND STRL (DRAPES) ×1 IMPLANT
COVER TIP SHEARS 8 DVNC (MISCELLANEOUS) ×1 IMPLANT
DERMABOND ADVANCED .7 DNX12 (GAUZE/BANDAGES/DRESSINGS) IMPLANT
DEVICE TROCAR PUNCTURE CLOSURE (ENDOMECHANICALS) IMPLANT
DRAPE ARM DVNC X/XI (DISPOSABLE) ×3 IMPLANT
DRAPE COLUMN DVNC XI (DISPOSABLE) ×1 IMPLANT
DRIVER NDL LRG 8 DVNC XI (INSTRUMENTS) ×2 IMPLANT
DRIVER NDL MEGA SUTCUT DVNCXI (INSTRUMENTS) ×2 IMPLANT
DRIVER NDLE LRG 8 DVNC XI (INSTRUMENTS) ×2 IMPLANT
DRIVER NDLE MEGA SUTCUT DVNCXI (INSTRUMENTS) ×2 IMPLANT
ELECT L-HOOK LAP 45CM DISP (ELECTROSURGICAL) ×1
ELECT PENCIL ROCKER SW 15FT (MISCELLANEOUS) ×1 IMPLANT
ELECT REM PT RETURN 15FT ADLT (MISCELLANEOUS) ×1 IMPLANT
ELECTRODE L-HOOK LAP 45CM DISP (ELECTROSURGICAL) ×1 IMPLANT
FORCEPS PROGRASP DVNC XI (FORCEP) ×1 IMPLANT
GLOVE BIO SURGEON STRL SZ7.5 (GLOVE) ×2 IMPLANT
GLOVE INDICATOR 8.0 STRL GRN (GLOVE) ×2 IMPLANT
GOWN STRL REUS W/ TWL XL LVL3 (GOWN DISPOSABLE) ×3 IMPLANT
GOWN STRL REUS W/TWL XL LVL3 (GOWN DISPOSABLE) ×3
GRASPER SUT TROCAR 14GX15 (MISCELLANEOUS) IMPLANT
GRASPER TIP-UP FEN DVNC XI (INSTRUMENTS) ×1 IMPLANT
IRRIG SUCT STRYKERFLOW 2 WTIP (MISCELLANEOUS)
IRRIGATION SUCT STRKRFLW 2 WTP (MISCELLANEOUS) IMPLANT
KIT BASIN OR (CUSTOM PROCEDURE TRAY) ×1 IMPLANT
KIT TURNOVER KIT A (KITS) IMPLANT
MANIFOLD NEPTUNE II (INSTRUMENTS) ×1 IMPLANT
MESH SOFT 12X12IN BARD (Mesh General) IMPLANT
NDL SPNL 18GX3.5 QUINCKE PK (NEEDLE) ×1 IMPLANT
NEEDLE SPNL 18GX3.5 QUINCKE PK (NEEDLE) ×1 IMPLANT
PACK CARDIOVASCULAR III (CUSTOM PROCEDURE TRAY) ×1 IMPLANT
SCISSORS MNPLR CVD DVNC XI (INSTRUMENTS) ×1 IMPLANT
SEAL UNIV 5-12 XI (MISCELLANEOUS) ×3 IMPLANT
SET TUBE SMOKE EVAC HIGH FLOW (TUBING) ×1 IMPLANT
SOL ELECTROSURG ANTI STICK (MISCELLANEOUS) ×1
SOLUTION ANTFG W/FOAM PAD STRL (MISCELLANEOUS) ×1 IMPLANT
SOLUTION ELECTROSURG ANTI STCK (MISCELLANEOUS) ×1 IMPLANT
SPIKE FLUID TRANSFER (MISCELLANEOUS) ×1 IMPLANT
SUT MNCRL AB 4-0 PS2 18 (SUTURE) ×1 IMPLANT
SUT SILK 0 SH 30 (SUTURE) IMPLANT
SUT STRAFIX PDS 18 CTX (SUTURE) IMPLANT
SUT STRAFIX SYMMETRIC 0-0 24 (SUTURE)
SUT STRAFIX SYMMETRIC 1-0 12 (SUTURE) ×1
SUT STRAFIX SYMMETRIC 1-0 24 (SUTURE)
SUT STRTFX SPIRAL PDS+ 2-0 23 (SUTURE)
SUT VLOC 180 0 6IN GS21 (SUTURE) IMPLANT
SUT VLOC 180 0 9IN GS21 (SUTURE) IMPLANT
SUT VLOC 180 2-0 9IN GS21 (SUTURE) IMPLANT
SUTURE STRAFIX SYMMETRC 0-0 24 (SUTURE) IMPLANT
SUTURE STRAFIX SYMMETRC 1-0 12 (SUTURE) IMPLANT
SUTURE STRAFIX SYMMETRC 1-0 24 (SUTURE) IMPLANT
SUTURE STRTFX SPRL PDS+ 2-0 23 (SUTURE) IMPLANT
SYR 20ML LL LF (SYRINGE) ×1 IMPLANT
TAPE STRIPS DRAPE STRL (GAUZE/BANDAGES/DRESSINGS) ×1 IMPLANT
TOWEL OR 17X26 10 PK STRL BLUE (TOWEL DISPOSABLE) ×1 IMPLANT
TOWEL OR NON WOVEN STRL DISP B (DISPOSABLE) IMPLANT
TROCAR ADV FIXATION 12X100MM (TROCAR) ×1 IMPLANT
TROCAR Z-THREAD FIOS 5X100MM (TROCAR) ×1 IMPLANT

## 2023-07-06 NOTE — Discharge Instructions (Signed)
 VENTRAL HERNIA REPAIR POST OPERATIVE INSTRUCTIONS  Thinking Clearly  The anesthesia may cause you to feel different for 1 or 2 days. Do not drive, drink alcohol, or make any big decisions for at least 2 days.  Nutrition When you wake up, you will be able to drink small amounts of liquid. If you do not feel sick, you can slowly advance your diet to regular foods. Continue to drink lots of fluids, usually about 8 to 10 glasses per day. Eat a high-fiber diet so you don't strain during bowel movements. High-Fiber Foods Foods high in fiber include beans, bran cereals and whole-grain breads, peas, dried fruit (figs, apricots, and dates), raspberries, blackberries, strawberries, sweet corn, broccoli, baked potatoes with skin, plums, pears, apples, greens, and nuts. Activity Slowly increase your activity. Be sure to get up and walk every hour or so to prevent blood clots. No heavy lifting or strenuous activity for 4 weeks following surgery to prevent hernias at your incision sites or recurrence of your hernia. It is normal to feel tired. You may need more sleep than usual.  Get your rest but make sure to get up and move around frequently to prevent blood clots and pneumonia.  Work and Return to School You can go back to work when you feel well enough. Discuss the timing with your surgeon. You can usually go back to school or work 1 week or less after an laparoscopic or an open repair. If your work requires heavy lifting or strenuous activity you need to be placed on light duty for 4 weeks following surgery. You can return to gym class, sports or other physical activities 4 weeks after surgery.  Wound Care You may experience significant bruising throughout the abdominal wall that may track down into the groin including into the scrotum in males.  Rest, elevating the groin and scrotum above the level of the heart, ice and compression with tight fitting underwear or an abdominal binder can help.   Always wash your hands before and after touching near your incision site. Do not soak in a bathtub until cleared at your follow up appointment. You may take a shower 24 hours after surgery. A small amount of drainage from the incision is normal. If the drainage is thick and yellow or the site is red, you may have an infection, so call your surgeon. If you have a drain in one of your incisions, it will be taken out in office when the drainage stops. Steri-Strips will fall off in 7 to 10 days or they will be removed during your first office visit. If you have dermabond glue covering over the incision, allow the glue to flake off on its own. Protect the new skin, especially from the sun. The sun can burn and cause darker scarring. Your scar will heal in about 4 to 6 weeks and will become softer and continue to fade over the next year.  The cosmetic appearance of the incisions will improve over the course of the first year after surgery. Sensation around your incision will return in a few weeks or months.  Bowel Movements After intestinal surgery, you may have loose watery stools for several days. If watery diarrhea lasts longer than 3 days, contact your surgeon. Pain medication (narcotics) can cause constipation. Increase the fiber in your diet with high-fiber foods if you are constipated. You can take an over the counter stool softener like Colace to avoid constipation.  Additional over the counter medications can also be used   if Colace isn't sufficient (for example, Milk of Magnesia or Miralax).  Pain The amount of pain is different for each person. Some people need only 1 to 3 doses of pain control medication, while others need more. Take alternating doses of tylenol and ibuprofen around the clock for the first five days following surgery.  This will provide a baseline of pain control and help with inflammation.  Take the narcotic pain medication in addition if needed for severe pain.  Contact  Your Surgeon at [phone], if you have: Pain that will not go away Pain that gets worse A fever of more than 101F (38.3C) Repeated vomiting Swelling, redness, bleeding, or bad-smelling drainage from your wound site Strong abdominal pain No bowel movement or unable to pass gas for 3 days Watery diarrhea lasting longer than 3 days  Pain Control The goal of pain control is to minimize pain, keep you moving and help you heal. Your surgical team will work with you on your pain plan. Most often a combination of therapies and medications are used to control your pain. You may also be given medication (local anesthetic) at the surgical site. This may help control your pain for several days. Extreme pain puts extra stress on your body at a time when your body needs to focus on healing. Do not wait until your pain has reached a level "10" or is unbearable before telling your doctor or nurse. It is much easier to control pain before it becomes severe. Following a laparoscopic procedure, pain is sometimes felt in the shoulder. This is due to the gas inserted into your abdomen during the procedure. Moving and walking helps to decrease the gas and the right shoulder pain.  Use the guide below for ways to manage your post-operative pain. Learn more by going to facs.org/safepaincontrol.  How Intense Is My Pain Common Therapies to Feel Better       I hardly notice my pain, and it does not interfere with my activities.  I notice my pain and it distracts me, but I can still do activities (sitting up, walking, standing).  Non-Medication Therapies  Ice (in a bag, applied over clothing at the surgical site), elevation, rest, meditation, massage, distraction (music, TV, play) walking and mild exercise Splinting the abdomen with pillows +  Non-Opioid Medications Acetaminophen (Tylenol) Non-steroidal anti-inflammatory drugs (NSAIDS) Aspirin, Ibuprofen (Motrin, Advil) Naproxen (Aleve) Take these as  needed, when you feel pain. Both acetaminophen and NSAIDs help to decrease pain and swelling (inflammation).      My pain is hard to ignore and is more noticeable even when I rest.  My pain interferes with my usual activities.  Non-Medication Therapies  +  Non-Opioid medications  Take on a regular schedule (around-the-clock) instead of as needed. (For example, Tylenol every 6 hours at 9:00 am, 3:00 pm, 9:00 pm, 3:00 am and Motrin every 6 hours at 12:00 am, 6:00 am, 12:00 pm, 6:00 pm)         I am focused on my pain, and I am not doing my daily activities.  I am groaning in pain, and I cannot sleep. I am unable to do anything.  My pain is as bad as it could be, and nothing else matters.  Non-Medication Therapies  +  Around-the-Clock Non-Opioid Medications  +  Short-acting opioids  Opioids should be used with other medications to manage severe pain. Opioids block pain and give a feeling of euphoria (feel high). Addiction, a serious side effect of opioids, is   rare with short-term (a few days) use.  Examples of short-acting opioids include: Tramadol (Ultram), Hydrocodone (Norco, Vicodin), Hydromorphone (Dilaudid), Oxycodone (Oxycontin)     The above directions have been adapted from the [employer] Surgical Patient Education Program.  Please refer to the ACS website if needed: [email or web address]://[email or web address]/-/media/files/[email or web address]   Dent Plantz, MD [facility] [address], [address], [place], [place]  [address] ?  [address] [address], [place], [place]   [address] [phone] ? [phone] ? FAX [phone] Web site: [email or web address]  

## 2023-07-06 NOTE — H&P (Signed)
Admitting Physician: Hyman Hopes Sophira Rumler  Service: General surgery  CC: hernia  Subjective   HPI: Caitlin Bowers is an 72 y.o. female who is here for robotic hernia repair  Past Medical History:  Diagnosis Date   Arthritis    RIGHT shoulder, bilateral hands   Cancer (HCC)    skin cancer   Cataract    bilateral sx   Diverticulitis    Graves disease    in remission as of 08/24/2021   Hyperlipidemia    on meds   Hypertension    on meds   Seasonal allergies    Urgency of urination     Past Surgical History:  Procedure Laterality Date   CATARACT EXTRACTION, BILATERAL Bilateral 02/2020   CERVICAL BIOPSY  W/ LOOP ELECTRODE EXCISION  2011   CESAREAN SECTION  1979   COLONOSCOPY  2017   UTSW, tics, 2 polyps, TA x 1, 5 yr recall   LAPAROTOMY N/A 02/05/2022   Procedure: EXPLORATORY LAPAROTOMY PROCTOSIGMOIDOSCOPY APPENDECTOMY;  Surgeon: Quentin Ore, MD;  Location: [facility] OR;  Service: General;  Laterality: N/A;   POLYPECTOMY  2017   TA x 1   TONSILLECTOMY  1957    Family History  Adopted: Yes  Problem Relation Age of Onset   Breast cancer Neg Hx     Social:  reports that she has never smoked. She has never used smokeless tobacco. She reports current alcohol use of about 7.0 standard drinks of alcohol per week. She reports that she does not use drugs.  Allergies:  Allergies  Allergen Reactions   Oxybutynin     Dry mouth; no taste; not effective for incontinence   Penicillins Hives   Sulfa Antibiotics Hives    Medications: Current Outpatient Medications  Medication Instructions   atenolol (TENORMIN) 12.5 mg, Oral, Daily   atorvastatin (LIPITOR) 40 MG tablet TAKE 1 TABLET BY MOUTH EVERYDAY AT BEDTIME   Calcium Carb-Cholecalciferol (CALCIUM 600 + D PO) 2 tablets, Oral, Daily   estradiol (ESTRACE) 0.1 MG/GM vaginal cream 1 Applicatorful, Vaginal, 3 times weekly   fluticasone (FLONASE) 50 MCG/ACT nasal spray 1 spray, Each Nare, 2 times daily    GLUCOSAMINE-CHONDROITIN PO 2 capsules, Oral, Daily   Lifitegrast (XIIDRA) 5 % SOLN 1 drop, Both Eyes, Every evening   methylcellulose (CITRUCEL) oral powder 1 packet, Oral, Daily   mirabegron ER (MYRBETRIQ) 50 mg, Oral, Daily   Probiotic Product (PROBIOTIC PO) 1 tablet, Oral, Daily   triamterene-hydrochlorothiazide (MAXZIDE-25) 37.5-25 MG tablet 1 tablet, Oral, Daily    ROS - all of the below systems have been reviewed with the patient and positives are indicated with bold text General: chills, fever or night sweats Eyes: blurry vision or double vision ENT: epistaxis or sore throat Allergy/Immunology: itchy/watery eyes or nasal congestion Hematologic/Lymphatic: bleeding problems, blood clots or swollen lymph nodes Endocrine: temperature intolerance or unexpected weight changes Breast: new or changing breast lumps or nipple discharge Resp: cough, shortness of breath, or wheezing CV: chest pain or dyspnea on exertion GI: as per HPI GU: dysuria, trouble voiding, or hematuria MSK: joint pain or joint stiffness Neuro: TIA or stroke symptoms Derm: pruritus and skin lesion changes Psych: anxiety and depression  Objective   PE Blood pressure (!) 151/73, pulse 65, temperature 97.7 F (36.5 C), temperature source Oral, resp. rate 16, height 5\' 10"  (1.778 m), weight 72.6 kg, SpO2 98%. Constitutional: NAD; conversant; no deformities Eyes: Moist conjunctiva; no lid lag; anicteric; PERRL Neck: Trachea midline; no thyromegaly Lungs: Normal  respiratory effort; no tactile fremitus CV: RRR; no palpable thrills; no pitting edema GI: Abd lower midline incisional hernia; no palpable hepatosplenomegaly MSK: Normal range of motion of extremities; no clubbing/cyanosis Psychiatric: Appropriate affect; alert and oriented x3 Lymphatic: No palpable cervical or axillary lymphadenopathy  No results found for this or any previous visit (from the past 24 hour(s)).  Imaging Orders  No imaging studies  ordered today   1. Acute diverticulitis of the sigmoid colon with small, suspected intramural abscess and possible microperforation. Because infiltrating mass lesion can sometimes mimic the presentation of acute diverticulitis, followup colonoscopy or confirmation of up-to-date colonoscopy is recommended.  2. Calcified uterine fibroids.  3. Mild liver heterogeneity may reflect underlying steatosis.   CT abd/pel from 09/30/22:  6.6 x 11.4 cm hernia defect   Assessment and Plan   Caitlin Bowers is an 72 y.o. female with an incisional hernia   Notes from previous visit 11/15/22: I assume her original issue was perforated diverticulitis at this point, now that she has had a documented CT scan of sigmoid diverticulitis with intramural abscess. I would say this is 2 episodes of diverticulitis which both required hospitalization. We discussed her surgical options moving forward. She would be a candidate for sigmoid colectomy due to her multiple episodes of diverticulitis. I explained this is a personalized decision based on her previous episodes of diverticulitis and their severity and her tolerance of the treatment for diverticulitis. I feel it would be reasonable to keep her colon, and it would be reasonable to remove her colon to prevent future diverticulitis episodes. She also had plans for a incisional hernia repair. I explained incisional hernia repair is an option at any point, but I would prefer to fix the hernia after any other planned surgeries. I explained her current options were to avoid all surgery and observe her hernia and colon for now, fix the hernia, or remove the sigmoid colon. She would like to observe these issues for now and avoid an operation at this time. I will see her back in 6 months to reevaluate her symptoms related to the hernia and to reevaluate her symptoms related to her colon. At that point we can make further plans.  Notes from today: The hernia measures  approximately 6.6 cm wide by 11.4 cm tall on CT scan from last October. I recommended robotic incisional hernia repair with mesh. We discussed the procedure itself as well as its risk, benefits, and alternatives. After full discussion all questions answered the patient granted consent to proceed.      Quentin Ore, MD  [facility] Use [email or web address] to contact on call provider

## 2023-07-06 NOTE — Op Note (Signed)
Patient: Caitlin Bowers ([birth year], [id number])  Date of Surgery: 07/06/2023   Preoperative Diagnosis: INCISIONAL HERNIA (6.6 cm wide x 11.4 cm tall defect on preoperative CT)  Postoperative Diagnosis:  Incisional hernia (6.6 cm wide x 11.4 cm tall defect on preoperative CT) Initial fat containing medium sized left indirect inguinal hernia Initial fat containing small right direct inguinal hernia  Surgical Procedure: XI Robotic Assisted Incisional Hernia Repair with Mesh  Bilateral Posterior Rectus Myofascial Release Bilateral Totally Extraperitoneal Bilateral Inguinal Hernia Repair With Mesh  Operative Team Members:  Surgeons and Role:    * Javon Hupfer, Hyman Hopes, MD - Primary   Anesthesiologist: Linton Rump, MD CRNA: Doran Clay, CRNA; Johnette Abraham, CRNA   Anesthesia: General   Fluids:  Total I/O In: 1350 [I.V.:1300; IV Piggyback:50] Out: 50 [Blood:50]  Complications: None  Drains:  None  Specimen: None  Disposition:  PACU - hemodynamically stable.  Plan of Care: Admit for overnight observation  Indications for Procedure: Caitlin Bowers is a 72 y.o. female who presented with an incisional hernia.  I recommended robotic ventral hernia repair with mesh.  The procedure itself as well as the risks, benefits and alternatives were described.  The risks discussed included but were not limited to the risk of infection, bleeding, damage to nearby structures, recurrent hernia, chronic pain, and mesh complication requiring removal.  After a full discussion and all questions answered, the patient granted consent to proceed.  Findings:  Hernia Location: Ventral hernia location: Epigastric (M2), Umbilical (M3), and Infraumbilical (M4) Initial fat containing medium sized left indirect inguinal hernia Initial fat containing small right direct inguinal hernia Hernia Size:  6.6 cm x 11.4 cm  Mesh Size &Type:  34 cm tall x 20 cm wide Bard Soft Mesh  - covered incisional and inguinal hernia defects Mesh Position: Sublay - Retromuscular Myofascial Releases: Bilateral Myofascial Release: Posterior rectus myofascial release   Description of Procedure: The patient was positioned supine, moderately flexed at the umbilical level, padded and secured on the operating table.  A timeout procedure was performed.    What is described is a robotic, totally extraperitoneal retromuscular incisional hernia repair with bilateral rectus myofascial release and retromuscular mesh placement with bilateral inguinal dissections with hernias identified and repaired.  Laparoscopic Portion: The retrorectus space was entered in the LEFT hypochondrium, at approximately the midclavicular line utilizing a 5 mm optical-viewing trocar.  Upon safe entry into this space, it was insufflated while performing a blunt dissection with the camera still in the optical trocar. A rectus myofascial release was performed on the LEFT side. Dissection was carried out laterally in the retromuscular plane to the edge of the rectus sheath progressively disconnecting the rectus muscle from the underlying posterior rectus sheath. Both the segmental innervation as well as the intercostal artery and vein brances to the rectus muscle were individually preserved.    During the left sided retrorectus dissection, a 12 mm trocar was placed into the lateral most edge of the retrorectus space.  With these initial trocars in position, the medial most aspect of the retrorectus plane was identified, and the posterior sheath was visualized as it inserted on the linea alba. The posterior sheath was incised with cautery entering the preperitoneal plane. A crossover was performed dissecting under the linea alba in the preperitoneal plane until the right rectus sheath was identified.  After identification of the right rectus sheath, it was incised vertically to enter the retrorectus space on the right. A rectus  myofascial release was performed on the RIGHT side.  Blunt dissection was carried out laterally in the retromuscular plane to the edge of the rectus sheath progressively disconnecting the rectus muscle from the underlying posterior rectus sheath. Both the segmental innervation as well as the intercostal artery and vein brances to the rectus muscle were individually preserved.   At this juncture, both retrorectus planes were initially connected to each other and there was space for further trocar placement. An 8 mm robotic trocar was placed in the midclavicular line in right retrorectus space.  A 8mm robotic trocar was placed within the left rectus musculature in the upper abdomen, and not through the linea alba.  The initial 5 mm access trocar in the midclavicular line within the left retrorectus space was switched out for an 8 mm robotic trocar.   Robotic Portion: The Intuitive daVinci Xi surgical robot was docked in the standard fashion and the procedure begun from the robotic console. A Prograsp instrument and monopolar shears were used for the dissection.  Dissection was carried down inferiorly preserving the peritoneum and the preperitoneal fat in the midline as it was gently dissected off of the overlying linea alba.  On the right side, the posterior rectus sheath was progressively disconnected from its insertion on the linea alba. This allowed for progression of the right side rectus myofascial release.  The rectus myofascial release accomplished medialization of the posterior rectus sheath towards the midline and disinsertion of the rectus muscle from its surrounding fascia, and thus its encasement in the rectus sheath, allowing for widening of the rectus muscle and transfer of the rectus flap towards the midline.  This will allow for future inset of the medial aspect of the flap for abdominal wall reconstruction.  Similarly, on the left side, the posterior rectus sheath was also progressively  disconnected from its insertion on the linea alba.  This allowed for progression of the left side rectus myofascial release.  The rectus myofascial release accomplished medialization of the posterior rectus sheath towards the midline and disinsertion of the rectus muscle from its surrounding fascia, and thus its encasement in the rectus sheath, allowing for widening of the rectus muscle and transfer of the rectus flap towards the midline.  This will allow for future inset of the medial aspect of the flap for abdominal wall reconstruction.  During the dissection of the midline the hernia defect was identified and the hernia sac was only partially reducible, therefore the sac was preserved for the most part and any defects were closed with 2-0 v-loc suture  Both the left and the right rectus myofascial releases were performed towards the lower abdomen, past the arcuate line bilaterally.  During this dissection, the peritoneum and preperitoneal fat in the midline were further preserved below the hernia as they were dissected off of the overlying linea alba.   The hernia defect area was now visualized fully.  The hernia defects were located in the epigastric, umbilical, infraumbilical regions.   Due to the low inferior extent of the hernia I dissected out bilateral inguinal regions and identified a direct inguinal hernia on the right and an indirect on the left.  There was a direct hernia on the RIGHT.  Dissection was carried out in the pre peritoneal space down to the level of the hernia sac which was reduced into the peritoneal cavity completely.  The round ligament was identified and divided utilizing cautery.  A large pre peritoneal dissection was performed to uncover the direct, indirect,  femoral and obturator spaces.  Cooper's ligament was uncovered medially and the psoas muscle uncovered laterally.  There was an indirect hernia on the left.  Dissection was carried out in the pre peritoneal space down  to the level of the hernia sac which was reduced into the peritoneal cavity completely.  The round ligament was identified and divided utilizing cautery.  A large pre peritoneal dissection was performed to uncover the direct, indirect, femoral and obturator spaces.  Cooper's ligament was uncovered medially and the psoas muscle uncovered laterally.  The midline hernia defect was closed utilizing a continuous, #1 Ethicon Stratafix Symmetric PDS Plus suture.  The hernia defect, and subsequently the rectus musculature, came together well for a complete abdominal wall reconstruction.  The dissected out retrorectus space was measured with a metric ruler so as to determine the size of the proposed mesh.    The robot was undocked and the laparoscope was inserted, inspecting for hemostasis.  The mesh deployment was performed laparoscopically.  Laparoscopic Portion:  A transversus abdominis plane (TAP) block was performed bilaterally with a mixture of marcaine and Exparel.  The anesthetic was first injected into the plane between the transversus abdominis and internal abdominal oblique muscles on the left. The TAP was repeated on the contralateral side.   A piece of Bard Soft was opened and trimmed to 20 cm wide x 34 cm tall. The mesh was advanced into the retrorectus space and the mesh positioned flat against the intact posterior rectus sheaths.  The mesh was fixated to Coopers ligament bilaterally using 0-silk suture  The trocars were removed and the skin closed with 4-0 Monocryl subcuticular sutures and skin glue.   Ivar Drape, MD General, Bariatric, & Minimally Invasive Surgery [facility], [place]

## 2023-07-06 NOTE — Anesthesia Procedure Notes (Signed)
Procedure Name: Intubation Date/Time: 07/06/2023 7:45 AM  Performed by: Johnette Abraham, CRNAPre-anesthesia Checklist: Patient identified, Emergency Drugs available, Suction available and Patient being monitored Patient Re-evaluated:Patient Re-evaluated prior to induction Oxygen Delivery Method: Circle System Utilized Preoxygenation: Pre-oxygenation with 100% oxygen Induction Type: IV induction Ventilation: Mask ventilation without difficulty Laryngoscope Size: Mac and 3 Grade View: Grade II Tube type: Oral Tube size: 7.0 mm Number of attempts: 1 Airway Equipment and Method: Stylet and Oral airway Placement Confirmation: ETT inserted through vocal cords under direct vision, positive ETCO2 and breath sounds checked- equal and bilateral Secured at: 22 cm Tube secured with: Tape Dental Injury: Teeth and Oropharynx as per pre-operative assessment

## 2023-07-06 NOTE — Anesthesia Postprocedure Evaluation (Signed)
Anesthesia Post Note  Patient: Tulani Kidney Brownlow  Procedure(s) Performed: XI ROBOTIC ASSISTED INCISIONAL HERNIA REPAIR WITH MESH (Abdomen)     Patient location during evaluation: PACU Anesthesia Type: General Level of consciousness: awake Pain management: pain level controlled Vital Signs Assessment: post-procedure vital signs reviewed and stable Respiratory status: spontaneous breathing, nonlabored ventilation and respiratory function stable Cardiovascular status: blood pressure returned to baseline and stable Postop Assessment: no apparent nausea or vomiting Anesthetic complications: no   No notable events documented.  Last Vitals:  Vitals:   07/06/23 1300 07/06/23 1316  BP: 112/67 131/65  Pulse: (!) 58 65  Resp: 11   Temp: (!) 36.4 C   SpO2: 95% 96%    Last Pain:  Vitals:   07/06/23 1300  TempSrc:   PainSc: 0-No pain                 Linton Rump

## 2023-07-06 NOTE — Transfer of Care (Signed)
Immediate Anesthesia Transfer of Care Note  Patient: Caitlin Bowers  Procedure(s) Performed: XI ROBOTIC ASSISTED INCISIONAL HERNIA REPAIR WITH MESH (Abdomen)  Patient Location: PACU  Anesthesia Type:General  Level of Consciousness: awake, oriented, sedated, and patient cooperative  Airway & Oxygen Therapy: Patient Spontanous Breathing and Patient connected to face mask oxygen  Post-op Assessment: Report given to RN, Post -op Vital signs reviewed and stable, and Patient moving all extremities  Post vital signs: Reviewed and stable  Last Vitals:  Vitals Value Taken Time  BP 126/63 07/06/23 1036  Temp 36.6 C 07/06/23 1036  Pulse 63 07/06/23 1038  Resp 11 07/06/23 1038  SpO2 97 % 07/06/23 1038  Vitals shown include unfiled device data.  Last Pain:  Vitals:   07/06/23 0546  TempSrc: Oral         Complications: No notable events documented.

## 2023-07-06 NOTE — Plan of Care (Signed)

## 2023-07-07 DIAGNOSIS — K4020 Bilateral inguinal hernia, without obstruction or gangrene, not specified as recurrent: Secondary | ICD-10-CM | POA: Diagnosis not present

## 2023-07-07 NOTE — Progress Notes (Signed)
Transition of Care [facility]) - Inpatient Brief Assessment   Patient Details  Name: Caitlin Bowers MRN: [id number] Date of Birth: 16-Oct-1951  [facility] of [facility]) CM/SW Contact:    Adrian Prows, RN Phone Number: 07/07/2023, 10:35 AM   Clinical Narrative: Pt identified POC Illene Labrador (husband) [phone]; Brief assessment completed.   Transition of Care Asessment: Insurance and Status: Insurance coverage has been reviewed Patient has primary care physician: Yes Home environment has been reviewed: yes Prior level of function:: independent Prior/Current Home Services: No current home services Social Determinants of Health Reivew: SDOH reviewed no interventions necessary Readmission risk has been reviewed: Yes Transition of care needs: no transition of care needs at this time

## 2023-07-07 NOTE — Plan of Care (Signed)

## 2023-07-07 NOTE — Discharge Summary (Signed)
Physician Discharge Summary  Patient ID: Caitlin Bowers MRN: [id number] DOB/AGE: 06-05-1951 72 y.o.  Admit date: 07/06/2023 Discharge date: 07/07/2023  Admission Diagnoses: incisional hernia  Discharge Diagnoses:  Principal Problem:   Incisional hernia Small R and L Inguinal hernias  Discharged Condition: good  Hospital Course: Pt did well overnight after surgery  Consults: None  Significant Diagnostic Studies: labs: cbc, bmet  Treatments: IV hydration, analgesia: acetaminophen, and surgery: robotic Incsional Hernia Repair  Discharge Exam: Blood pressure 129/69, pulse 70, temperature 97.9 F (36.6 C), temperature source Oral, resp. rate 17, height 5\' 10"  (1.778 m), weight 72.6 kg, SpO2 98%. General appearance: alert and cooperative GI: soft Incision/Wound: c/d/i  Disposition: Discharge disposition: 01-Home or Self Care        Allergies as of 07/07/2023       Reactions   Oxybutynin    Dry mouth; no taste; not effective for incontinence   Penicillins Hives   Sulfa Antibiotics Hives        Medication List     TAKE these medications    atenolol 25 MG tablet Commonly known as: TENORMIN Take 0.5 tablets (12.5 mg total) by mouth daily.   atorvastatin 40 MG tablet Commonly known as: LIPITOR TAKE 1 TABLET BY MOUTH EVERYDAY AT BEDTIME   CALCIUM 600 + D PO Take 2 tablets by mouth daily.   Citrucel oral powder Generic drug: methylcellulose Take 1 packet by mouth daily.   estradiol 0.1 MG/GM vaginal cream Commonly known as: ESTRACE Place 1 Applicatorful vaginally 3 (three) times a week.   fluticasone 50 MCG/ACT nasal spray Commonly known as: FLONASE Place 1 spray into both nostrils 2 (two) times daily.   gabapentin 100 MG capsule Commonly known as: Neurontin Take 1 capsule (100 mg total) by mouth 3 (three) times daily as needed (Nerve pain).   GLUCOSAMINE-CHONDROITIN PO Take 2 capsules by mouth daily.   methocarbamol 750 MG tablet Commonly  known as: Robaxin-750 Take 1 tablet (750 mg total) by mouth every 6 (six) hours as needed for muscle spasms.   mirabegron ER 50 MG Tb24 tablet Commonly known as: Myrbetriq Take 1 tablet (50 mg total) by mouth daily.   oxyCODONE-acetaminophen 5-325 MG tablet Commonly known as: Percocet Take 1 tablet by mouth every 4 (four) hours as needed for severe pain.   PROBIOTIC PO Take 1 tablet by mouth daily.   triamterene-hydrochlorothiazide 37.5-25 MG tablet Commonly known as: MAXZIDE-25 Take 1 tablet by mouth daily.   Xiidra 5 % Soln Generic drug: Lifitegrast Place 1 drop into both eyes every evening.        Follow-up Information     Stechschulte, Hyman Hopes, MD Follow up in 4 week(s).   Specialty: Surgery Contact information: [address] [place] [address] [phone]                 Signed: Vanita Panda 07/07/2023, 9:46 AM

## 2023-07-09 ENCOUNTER — Encounter (HOSPITAL_COMMUNITY): Payer: Self-pay | Admitting: Surgery

## 2023-07-17 ENCOUNTER — Ambulatory Visit (INDEPENDENT_AMBULATORY_CARE_PROVIDER_SITE_OTHER): Payer: Medicare Other | Admitting: Obstetrics & Gynecology

## 2023-07-17 ENCOUNTER — Encounter (HOSPITAL_BASED_OUTPATIENT_CLINIC_OR_DEPARTMENT_OTHER): Payer: Self-pay | Admitting: Obstetrics & Gynecology

## 2023-07-17 ENCOUNTER — Other Ambulatory Visit (HOSPITAL_COMMUNITY)
Admission: RE | Admit: 2023-07-17 | Discharge: 2023-07-17 | Disposition: A | Discharge status: Home / Self Care | Payer: Medicare Other | Source: Ambulatory Visit | Attending: Obstetrics & Gynecology | Admitting: Obstetrics & Gynecology

## 2023-07-17 VITALS — BP 134/76 | HR 76 | Ht 70.0 in | Wt 167.4 lb

## 2023-07-17 DIAGNOSIS — B977 Papillomavirus as the cause of diseases classified elsewhere: Secondary | ICD-10-CM

## 2023-07-17 DIAGNOSIS — N870 Mild cervical dysplasia: Secondary | ICD-10-CM

## 2023-07-17 DIAGNOSIS — R87810 Cervical high risk human papillomavirus (HPV) DNA test positive: Secondary | ICD-10-CM | POA: Insufficient documentation

## 2023-07-17 NOTE — Progress Notes (Signed)
72 y.o. G6P0000 Married Not Hispanic or Latino female here for colposcopy with possible biopsies and/or ECC due to +HR HPV obtained on pap smear.     Prior evaluation/treatment:  h/o LEEP  No LMP recorded. Patient is postmenopausal.           Patient has been counseled about results and procedure.  Risks and benefits have bene reviewed including immediate and/or delayed bleeding, infection, cervical scaring from procedure, possibility of needing additional follow up as well as treatment.  Rare risks of missing a lesion discussed as well.  All questions answered.  Pt ready to proceed.  Consent obtained.  BP 134/76 (BP Location: Left Arm, Patient Position: Sitting, Cuff Size: Normal)   Pulse 76   Ht 5\' 10"  (1.778 m) Comment: Reported  Wt 167 lb 6.4 oz (75.9 kg)   BMI 24.02 kg/m   General appearance: alert, cooperative and appears stated age Lymph nodes: No abnormal inguinal nodes palpated Neurologic: Grossly normal  Pelvic: External genitalia:  no lesions              Urethra:  normal appearing urethra with no masses, tenderness or lesions              Bartholins and Skenes: normal                 Vagina: normal appearing vagina with normal color and no discharge, no lesions                Speculum placed.  3% acetic acid applied to cervix for >45 seconds.  Cervix visualized with both 7.5X and 15X magnification.  Green filter also used.  Lugols solution was used.  Findings:  normal cervix.  Endocervical transition zone.  Biopsy:  not obtained.  ECC:  was performed.  Monsel's was not needed.  Excellent hemostasis was present.  Pt tolerated procedure well and all instruments were removed.   Chaperone, Raechel Ache, RN, was present during procedure.  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

## 2023-12-15 ENCOUNTER — Other Ambulatory Visit: Payer: Self-pay | Admitting: Family Medicine

## 2023-12-15 DIAGNOSIS — R3915 Urgency of urination: Secondary | ICD-10-CM

## 2023-12-22 ENCOUNTER — Other Ambulatory Visit: Payer: Self-pay | Admitting: Family Medicine

## 2023-12-22 DIAGNOSIS — E785 Hyperlipidemia, unspecified: Secondary | ICD-10-CM

## 2024-01-02 ENCOUNTER — Encounter: Payer: Self-pay | Admitting: Family Medicine

## 2024-01-02 ENCOUNTER — Ambulatory Visit: Payer: Medicare Other | Admitting: Family Medicine

## 2024-01-02 VITALS — BP 132/82 | HR 70 | Temp 98.1°F | Ht 69.25 in | Wt 169.7 lb

## 2024-01-02 DIAGNOSIS — R3915 Urgency of urination: Secondary | ICD-10-CM

## 2024-01-02 DIAGNOSIS — Z13220 Encounter for screening for lipoid disorders: Secondary | ICD-10-CM | POA: Diagnosis not present

## 2024-01-02 DIAGNOSIS — E785 Hyperlipidemia, unspecified: Secondary | ICD-10-CM | POA: Diagnosis not present

## 2024-01-02 DIAGNOSIS — I10 Essential (primary) hypertension: Secondary | ICD-10-CM | POA: Diagnosis not present

## 2024-01-02 DIAGNOSIS — Z0000 Encounter for general adult medical examination without abnormal findings: Secondary | ICD-10-CM

## 2024-01-02 DIAGNOSIS — Z131 Encounter for screening for diabetes mellitus: Secondary | ICD-10-CM

## 2024-01-02 LAB — COMPREHENSIVE METABOLIC PANEL
ALT: 17 U/L (ref 0–35)
AST: 21 U/L (ref 0–37)
Albumin: 4.3 g/dL (ref 3.5–5.2)
Alkaline Phosphatase: 96 U/L (ref 39–117)
BUN: 19 mg/dL (ref 6–23)
CO2: 28 meq/L (ref 19–32)
Calcium: 9.9 mg/dL (ref 8.4–10.5)
Chloride: 98 meq/L (ref 96–112)
Creatinine, Ser: 1.07 mg/dL (ref 0.40–1.20)
GFR: 51.16 mL/min — ABNORMAL LOW (ref >60.00)
Glucose, Bld: 93 mg/dL (ref 70–99)
Potassium: 4.1 meq/L (ref 3.5–5.1)
Sodium: 133 meq/L — ABNORMAL LOW (ref 135–145)
Total Bilirubin: 1 mg/dL (ref 0.2–1.2)
Total Protein: 7 g/dL (ref 6.0–8.3)

## 2024-01-02 LAB — CBC
HCT: 39.4 % (ref 36.0–46.0)
Hemoglobin: 13.2 g/dL (ref 12.0–15.0)
MCHC: 33.4 g/dL (ref 30.0–36.0)
MCV: 94.4 fL (ref 78.0–100.0)
Platelets: 231 10*3/uL (ref 150.0–400.0)
RBC: 4.18 Mil/uL (ref 3.87–5.11)
RDW: 13.9 % (ref 11.5–15.5)
WBC: 6.3 10*3/uL (ref 4.0–10.5)

## 2024-01-02 LAB — LIPID PANEL
Cholesterol: 161 mg/dL (ref 0–200)
HDL: 51.7 mg/dL (ref >39.00)
LDL Cholesterol: 82 mg/dL (ref 0–99)
NonHDL: 109.62
Total CHOL/HDL Ratio: 3
Triglycerides: 137 mg/dL (ref 0.0–149.0)
VLDL: 27.4 mg/dL (ref 0.0–40.0)

## 2024-01-02 LAB — HEMOGLOBIN A1C: Hgb A1c MFr Bld: 6.1 % (ref 4.6–6.5)

## 2024-01-02 MED ORDER — TRIAMTERENE-HCTZ 37.5-25 MG PO TABS
1.0000 | ORAL_TABLET | Freq: Every day | ORAL | 3 refills | Status: DC
Start: 2024-01-02 — End: 2024-07-16

## 2024-01-02 MED ORDER — MIRABEGRON ER 50 MG PO TB24: 50.0000 mg | ORAL_TABLET | Freq: Every day | ORAL | 3 refills | Status: DC

## 2024-01-02 MED ORDER — ATORVASTATIN CALCIUM 40 MG PO TABS: ORAL_TABLET | ORAL | 3 refills | Status: DC

## 2024-01-02 MED ORDER — ATENOLOL 25 MG PO TABS: 12.5000 mg | ORAL_TABLET | Freq: Every day | ORAL | 3 refills | Status: DC

## 2024-01-02 NOTE — Progress Notes (Signed)
Subjective:   Caitlin Bowers is a 73 y.o. female who presents for Medicare Annual (Subsequent) preventive examination.  Visit Complete: In person  Patient Medicare AWV questionnaire was completed by the patient on 01/02/24; I have confirmed that all information answered by patient is correct and no changes since this date.        Objective:    Today's Vitals   01/02/24 0923  BP: 132/82  Pulse: 70  Temp: 98.1 F (36.7 C)  TempSrc: Oral  SpO2: 98%  Weight: 169 lb 11.2 oz (77 kg)  Height: 5' 9.25" (1.759 m)   Body mass index is 24.88 kg/m.     01/02/2024    9:48 AM 07/06/2023    1:14 PM 07/06/2023    5:57 AM 07/02/2023   10:24 AM 12/19/2022    1:51 PM 11/12/2022    2:28 PM 02/05/2022    7:57 PM  Advanced Directives  Does Patient Have a Medical Advance Directive? Yes Yes Yes Yes Yes Yes Yes  Type of Estate agent of [place];Living will  Healthcare Power of [place];Living will  Healthcare Power of [place];Living will Healthcare Power of [employer] of [place];Living will  Does patient want to make changes to medical advance directive? No - Patient declined No - Patient declined No - Patient declined No - Patient declined   No - Patient declined  Copy of Healthcare Power of Attorney in Chart? Yes - validated most recent copy scanned in chart (See row information) Yes - validated most recent copy scanned in chart (See row information) Yes - validated most recent copy scanned in chart (See row information)  No - copy requested  No - copy requested    Current Medications (verified) Outpatient Encounter Medications as of 01/02/2024  Medication Sig   Calcium Carb-Cholecalciferol (CALCIUM 600 + D PO) Take 2 tablets by mouth daily.   estradiol (ESTRACE) 0.1 MG/GM vaginal cream Place 1 Applicatorful vaginally 3 (three) times a week.   fluticasone (FLONASE) 50 MCG/ACT nasal spray Place 1 spray into both nostrils 2 (two) times daily.    GLUCOSAMINE-CHONDROITIN PO Take 2 capsules by mouth daily.   Lifitegrast (XIIDRA) 5 % SOLN Place 1 drop into both eyes every evening.   methylcellulose (CITRUCEL) oral powder Take 1 packet by mouth daily.   Probiotic Product (PROBIOTIC PO) Take 1 tablet by mouth daily.   [DISCONTINUED] atenolol (TENORMIN) 25 MG tablet Take 0.5 tablets (12.5 mg total) by mouth daily.   [DISCONTINUED] atorvastatin (LIPITOR) 40 MG tablet TAKE 1 TABLET BY MOUTH EVERYDAY AT BEDTIME   [DISCONTINUED] mirabegron ER (MYRBETRIQ) 50 MG TB24 tablet TAKE 1 TABLET BY MOUTH EVERY DAY   [DISCONTINUED] triamterene-hydrochlorothiazide (MAXZIDE-25) 37.5-25 MG tablet Take 1 tablet by mouth daily.   atenolol (TENORMIN) 25 MG tablet Take 0.5 tablets (12.5 mg total) by mouth daily.   atorvastatin (LIPITOR) 40 MG tablet TAKE 1 TABLET BY MOUTH EVERYDAY AT BEDTIME   mirabegron ER (MYRBETRIQ) 50 MG TB24 tablet Take 1 tablet (50 mg total) by mouth daily.   triamterene-hydrochlorothiazide (MAXZIDE-25) 37.5-25 MG tablet Take 1 tablet by mouth daily.   No facility-administered encounter medications on file as of 01/02/2024.    Allergies (verified) Oxybutynin, Penicillins, and Sulfa antibiotics   History: Past Medical History:  Diagnosis Date   Arthritis    RIGHT shoulder, bilateral hands   Cancer (HCC)    skin cancer   Cataract    bilateral sx   Diverticulitis    Graves disease  in remission as of 08/24/2021   Hyperlipidemia    on meds   Hypertension    on meds   Seasonal allergies    Urgency of urination    Past Surgical History:  Procedure Laterality Date   CATARACT EXTRACTION, BILATERAL Bilateral 02/2020   CERVICAL BIOPSY  W/ LOOP ELECTRODE EXCISION  2011   CESAREAN SECTION  1979   COLONOSCOPY  2017   UTSW, tics, 2 polyps, TA x 1, 5 yr recall   LAPAROTOMY N/A 02/05/2022   Procedure: EXPLORATORY LAPAROTOMY PROCTOSIGMOIDOSCOPY APPENDECTOMY;  Surgeon: Quentin Ore, MD;  Location: [facility] OR;  Service: General;   Laterality: N/A;   POLYPECTOMY  2017   TA x 1   TONSILLECTOMY  1957   XI ROBOTIC ASSISTED VENTRAL HERNIA N/A 07/06/2023   Procedure: XI ROBOTIC ASSISTED INCISIONAL HERNIA REPAIR WITH MESH;  Surgeon: Quentin Ore, MD;  Location: [facility];  Service: General;  Laterality: N/A;  MESH   Family History  Adopted: Yes  Problem Relation Age of Onset   Breast cancer Neg Hx    Social History   Socioeconomic History   Marital status: Married    Spouse name: Not on file   Number of children: Not on file   Years of education: Not on file   Highest education level: Some college, no degree  Occupational History   Not on file  Tobacco Use   Smoking status: Never   Smokeless tobacco: Never  Vaping Use   Vaping status: Never Used  Substance and Sexual Activity   Alcohol use: Yes    Alcohol/week: 7.0 standard drinks of alcohol    Types: 7 Shots of liquor per week    Comment: per week   Drug use: Never   Sexual activity: Yes    Birth control/protection: Post-menopausal  Other Topics Concern   Not on file  Social History Narrative   ** Merged History Encounter **       Social Drivers of Health   Financial Resource Strain: Low Risk  (01/02/2024)   Overall Financial Resource Strain (CARDIA)    Difficulty of Paying Living Expenses: Not hard at all  Food Insecurity: No Food Insecurity (01/02/2024)   Hunger Vital Sign    Worried About Running Out of Food in the Last Year: Never true    Ran Out of Food in the Last Year: Never true  Transportation Needs: No Transportation Needs (01/02/2024)   PRAPARE - Administrator, Civil Service (Medical): No    Lack of Transportation (Non-Medical): No  Physical Activity: Sufficiently Active (01/02/2024)   Exercise Vital Sign    Days of Exercise per Week: 5 days    Minutes of Exercise per Session: 90 min  Stress: No Stress Concern Present (01/02/2024)   [employer] of Occupational Health - Occupational Stress Questionnaire    Feeling  of Stress : Not at all  Social Connections: Moderately Integrated (01/02/2024)   Social Connection and Isolation Panel [NHANES]    Frequency of Communication with Friends and Family: More than three times a week    Frequency of Social Gatherings with Friends and Family: Three times a week    Attends Religious Services: Never    Active Member of Clubs or Organizations: Yes    Attends Banker Meetings: More than 4 times per year    Marital Status: Married    Tobacco Counseling Counseling given: Not Answered   Activities of Daily Living    01/02/2024  9:45 AM 12/29/2023   11:17 AM  In your present state of health, do you have any difficulty performing the following activities:  Hearing? 0 0  Vision? 0 0  Difficulty concentrating or making decisions? 0 0  Walking or climbing stairs? 0 0  Dressing or bathing? 0 0  Doing errands, shopping? 0 0  Preparing Food and eating ?  N  Using the Toilet?  N  In the past six months, have you accidently leaked urine?  N  Do you have problems with loss of bowel control?  N  Managing your Medications?  N  Managing your Finances?  N  Housekeeping or managing your Housekeeping?  N    Patient Care Team: Karie Georges, MD as PCP - General (Family Medicine) Bettey Costa, MD as Consulting Physician (Dermatology)  Indicate any recent Medical Services you may have received from other than [facility] providers in the past year (date may be approximate). Physical Exam Vitals reviewed.  Constitutional:      Appearance: Normal appearance. She is normal weight.  HENT:     Right Ear: Tympanic membrane normal.     Left Ear: Tympanic membrane normal.  Neck:     Thyroid: No thyromegaly.  Cardiovascular:     Rate and Rhythm: Normal rate and regular rhythm.     Pulses: Normal pulses.     Heart sounds: Normal heart sounds. No murmur heard. Pulmonary:     Effort: Pulmonary effort is normal.     Breath sounds: Normal breath sounds. No  wheezing or rales.  Abdominal:     General: Bowel sounds are normal.  Musculoskeletal:        General: Normal range of motion.     Right lower leg: No edema.     Left lower leg: No edema.  Neurological:     Mental Status: She is alert and oriented to person, place, and time. Mental status is at baseline.     Gait: Gait normal.  Psychiatric:        Mood and Affect: Mood normal.        Behavior: Behavior normal.        Assessment:   This is a routine wellness examination for Moranda.  Hearing/Vision screen No results found.   Goals Addressed   None   Depression Screen    01/02/2024    9:19 AM 07/17/2023    2:37 PM 06/04/2023   10:27 AM 05/09/2023   10:57 AM 12/19/2022    1:48 PM 12/11/2022   11:08 AM 04/27/2022    3:09 PM  PHQ 2/9 Scores  PHQ - 2 Score 0 0 0 0 0 0 0  PHQ- 9 Score   0  0 0     Fall Risk    01/02/2024    9:18 AM 12/29/2023   11:17 AM 06/04/2023   10:27 AM 06/01/2023   10:00 AM 12/19/2022    1:49 PM  Fall Risk   Falls in the past year? 1 1 0 0 1  Number falls in past yr: 0 0 0  0  Injury with Fall? 1 1 0  1  Comment     Fx Rt Toe. Followed by medical attention  Risk for fall due to : Impaired balance/gait  No Fall Risks  Impaired balance/gait;No Fall Risks  Follow up Falls evaluation completed  Falls evaluation completed  Falls prevention discussed    MEDICARE RISK AT HOME: Medicare Risk at Home Any stairs in or around  the home?: (Patient-Rptd) Yes If so, are there any without handrails?: (Patient-Rptd) No Home free of loose throw rugs in walkways, pet beds, electrical cords, etc?: (Patient-Rptd) Yes Adequate lighting in your home to reduce risk of falls?: (Patient-Rptd) Yes Life alert?: (Patient-Rptd) No Use of a cane, walker or w/c?: (Patient-Rptd) No Grab bars in the bathroom?: (Patient-Rptd) Yes Shower chair or bench in shower?: (Patient-Rptd) Yes Elevated toilet seat or a handicapped toilet?: (Patient-Rptd) Yes  TIMED UP AND GO:  Was the test  performed?  Yes  Length of time to ambulate 10 feet: 6 sec Gait steady and fast without use of assistive device    Cognitive Function:        01/02/2024    9:46 AM 12/19/2022    1:51 PM 12/16/2021    1:57 PM  6CIT Screen  What Year? 0 points 0 points 0 points  What month? 0 points 0 points 0 points  What time? 0 points 0 points 0 points  Count back from 20 0 points 0 points 0 points  Months in reverse 0 points 0 points 0 points  Repeat phrase 0 points 0 points 0 points  Total Score 0 points 0 points 0 points    Immunizations Immunization History  Administered Date(s) Administered   Influenza, High Dose Seasonal PF 08/17/2019, 08/10/2021, 08/22/2023   Influenza-Unspecified 10/23/2006, 10/08/2009, 09/13/2010, 08/30/2011, 09/03/2014, 08/07/2015, 03/04/2016, 08/04/2016, 09/03/2016, 07/30/2017, 08/10/2018, 08/11/2019, 08/06/2020, 08/10/2021, 09/07/2022   Moderna Covid-19 Vaccine Bivalent Booster 31yrs & up 08/22/2023   Moderna Sars-Covid-2 Vaccination 09/07/2022   PFIZER(Purple Top)SARS-COV-2 Vaccination 12/23/2019, 01/12/2020, 08/11/2020, 03/15/2021   PNEUMOCOCCAL CONJUGATE-20 08/10/2021   Pfizer Covid-19 Vaccine Bivalent Booster 106yrs & up 10/14/2021, 06/01/2022   Pneumococcal Conjugate-13 10/01/2014   Pneumococcal Polysaccharide-23 02/04/2016   RSV,unspecified 10/09/2022   Respiratory Syncytial Virus Vaccine,Recomb Aduvanted(Arexvy) 10/09/2022   Tdap 02/14/2017   Zoster Recombinant(Shingrix) 02/13/2018, 06/23/2018   Zoster, Live 08/31/2011    TDAP status: Up to date  Flu Vaccine status: Up to date  Pneumococcal vaccine status: Up to date  Covid-19 vaccine status: Completed vaccines  Qualifies for Shingles Vaccine? Yes   Zostavax completed Yes   Shingrix Completed?: Yes  Screening Tests Health Maintenance  Topic Date Due   COVID-19 Vaccine (9 - 2024-25 season) 10/17/2023   Medicare Annual Wellness (AWV)  01/01/2025   MAMMOGRAM  02/28/2025   DTaP/Tdap/Td (2 - Td or  Tdap) 02/15/2027   Colonoscopy  09/07/2028   Pneumonia Vaccine 54+ Years old  Completed   INFLUENZA VACCINE  Completed   DEXA SCAN  Completed   Hepatitis C Screening  Completed   Zoster Vaccines- Shingrix  Completed   HPV VACCINES  Aged Out    Health Maintenance  Health Maintenance Due  Topic Date Due   COVID-19 Vaccine (9 - 2024-25 season) 10/17/2023    Colorectal cancer screening: Type of screening: Colonoscopy. Completed 2022. Repeat every 7 years  Mammogram status: Completed 03/01/2023. Repeat every year  Bone Density status: Completed 05/30/2023. Results reflect: Bone density results: OSTEOPENIA. Repeat every 2 years.  Lung Cancer Screening: (Low Dose CT Chest recommended if Age 78-80 years, 20 pack-year currently smoking OR have quit w/in 15years.) does not qualify.   Lung Cancer Screening Referral: N/A  Additional Screening:  Hepatitis C Screening: does qualify; Completed 2021  Vision Screening: Recommended annual ophthalmology exams for early detection of glaucoma and other disorders of the eye. Is the patient up to date with their annual eye exam?  Yes  Who is the provider or  what is the name of the office in which the patient attends annual eye exams? Dr. Charlotte Sanes If pt is not established with a provider, would they like to be referred to a provider to establish care?  N/A .   Dental Screening: Recommended annual dental exams for proper oral hygiene  Community Resource Referral / Chronic Care Management: CRR required this visit?  No   CCM required this visit?  No     Plan:    Problem List Items Addressed This Visit       Unprioritized   Urinary urgency   Relevant Medications   mirabegron ER (MYRBETRIQ) 50 MG TB24 tablet   Hyperlipidemia   Relevant Medications   atenolol (TENORMIN) 25 MG tablet   atorvastatin (LIPITOR) 40 MG tablet   triamterene-hydrochlorothiazide (MAXZIDE-25) 37.5-25 MG tablet   Hypertension   Relevant Medications   atenolol  (TENORMIN) 25 MG tablet   atorvastatin (LIPITOR) 40 MG tablet   triamterene-hydrochlorothiazide (MAXZIDE-25) 37.5-25 MG tablet   Other Relevant Orders   CBC (no diff)   CMP   Other Visit Diagnoses       Encounter for Medicare annual wellness exam    -  Primary     Lipid screening       Relevant Orders   Lipid Panel       I have personally reviewed and noted the following in the patient's chart:   Medical and social history Use of alcohol, tobacco or illicit drugs  Current medications and supplements including opioid prescriptions. Patient is not currently taking opioid prescriptions. Functional ability and status Nutritional status Physical activity Advanced directives List of other physicians Hospitalizations, surgeries, and ER visits in previous 12 months Vitals Screenings to include cognitive, depression, and falls Referrals and appointments  In addition, I have reviewed and discussed with patient certain preventive protocols, quality metrics, and best practice recommendations. A written personalized care plan for preventive services as well as general preventive health recommendations were provided to patient.     Karie Georges, MD   01/02/2024   After Visit Summary: (In Person-Printed) AVS printed and given to the patient

## 2024-01-04 ENCOUNTER — Encounter: Payer: Self-pay | Admitting: Family Medicine

## 2024-02-29 ENCOUNTER — Other Ambulatory Visit (HOSPITAL_BASED_OUTPATIENT_CLINIC_OR_DEPARTMENT_OTHER): Payer: Self-pay | Admitting: Family Medicine

## 2024-02-29 DIAGNOSIS — Z1231 Encounter for screening mammogram for malignant neoplasm of breast: Secondary | ICD-10-CM

## 2024-03-03 ENCOUNTER — Ambulatory Visit (HOSPITAL_BASED_OUTPATIENT_CLINIC_OR_DEPARTMENT_OTHER)
Admission: RE | Admit: 2024-03-03 | Discharge: 2024-03-03 | Disposition: A | Discharge status: Home / Self Care | Source: Ambulatory Visit | Attending: Family Medicine | Admitting: Family Medicine

## 2024-03-03 ENCOUNTER — Encounter (HOSPITAL_BASED_OUTPATIENT_CLINIC_OR_DEPARTMENT_OTHER): Payer: Self-pay | Admitting: Radiology

## 2024-03-03 DIAGNOSIS — Z1231 Encounter for screening mammogram for malignant neoplasm of breast: Secondary | ICD-10-CM | POA: Diagnosis present

## 2024-03-04 ENCOUNTER — Encounter: Payer: Self-pay | Admitting: Family Medicine

## 2024-05-01 ENCOUNTER — Ambulatory Visit (HOSPITAL_BASED_OUTPATIENT_CLINIC_OR_DEPARTMENT_OTHER): Payer: Medicare Other | Admitting: Obstetrics & Gynecology

## 2024-05-12 ENCOUNTER — Ambulatory Visit (HOSPITAL_BASED_OUTPATIENT_CLINIC_OR_DEPARTMENT_OTHER): Payer: Medicare Other | Admitting: Obstetrics & Gynecology

## 2024-06-16 ENCOUNTER — Ambulatory Visit (HOSPITAL_BASED_OUTPATIENT_CLINIC_OR_DEPARTMENT_OTHER): Payer: Medicare Other | Admitting: Obstetrics & Gynecology

## 2024-06-30 ENCOUNTER — Ambulatory Visit (INDEPENDENT_AMBULATORY_CARE_PROVIDER_SITE_OTHER): Payer: Medicare Other | Admitting: Family Medicine

## 2024-06-30 ENCOUNTER — Encounter: Payer: Self-pay | Admitting: Family Medicine

## 2024-06-30 VITALS — BP 140/92 | HR 64 | Temp 98.2°F | Ht 70.0 in | Wt 169.1 lb

## 2024-06-30 DIAGNOSIS — I10 Essential (primary) hypertension: Secondary | ICD-10-CM | POA: Diagnosis not present

## 2024-06-30 DIAGNOSIS — R7303 Prediabetes: Secondary | ICD-10-CM | POA: Diagnosis not present

## 2024-06-30 LAB — COMPREHENSIVE METABOLIC PANEL WITH GFR
ALT: 19 U/L (ref 0–35)
AST: 19 U/L (ref 0–37)
Albumin: 4.5 g/dL (ref 3.5–5.2)
Alkaline Phosphatase: 84 U/L (ref 39–117)
BUN: 18 mg/dL (ref 6–23)
CO2: 27 meq/L (ref 19–32)
Calcium: 9.9 mg/dL (ref 8.4–10.5)
Chloride: 98 meq/L (ref 96–112)
Creatinine, Ser: 1.01 mg/dL (ref 0.40–1.20)
GFR: 54.64 mL/min — ABNORMAL LOW (ref >60.00)
Glucose, Bld: 92 mg/dL (ref 70–99)
Potassium: 4.5 meq/L (ref 3.5–5.1)
Sodium: 135 meq/L (ref 135–145)
Total Bilirubin: 1 mg/dL (ref 0.2–1.2)
Total Protein: 7.2 g/dL (ref 6.0–8.3)

## 2024-06-30 LAB — HEMOGLOBIN A1C: Hgb A1c MFr Bld: 6.1 % (ref 4.6–6.5)

## 2024-06-30 NOTE — Patient Instructions (Signed)
 Check blood pressure every for the next 2 weeks. In 2 weeks, message me the results  GOAL anything less than 140/90

## 2024-06-30 NOTE — Assessment & Plan Note (Signed)
 A1C was 6 in January, will recheck A1C today with her CMP. She denies any diabetic symptoms at this time.

## 2024-06-30 NOTE — Assessment & Plan Note (Signed)
 Repeat BP today in office was elevated, I advised the patient to start checking her BP daily at home for the next 2 weeks and to message me the results. Will recheck kidney function today as well. Continue triamterene /hydrochlorothiazide  and atenolol  daily for now. We will likely need to see her back in a few months to recheck her BP in the office.

## 2024-06-30 NOTE — Progress Notes (Signed)
 Established Patient Office Visit  Subjective   Patient ID: Caitlin Bowers, female    DOB: 04/09/1951  Age: 73 y.o. MRN: [id number]  Chief Complaint  Patient presents with   Medical Management of Chronic Issues    Pt is here for 6 month follow up today for blood pressure and prediabetes as well as CKD stage 2. Pt reports no new symptoms or issues since the last visit.   HTN -- BP in office performed and is elevated today. She  reports no side effects to the medications, no chest pain, SOB, dizziness or headaches. She has a BP cuff at home but is not checking BP regularly. Repeat today was 162/81 with the machine.   Pt reports no new symptoms or issues to discuss today.        Current Outpatient Medications  Medication Instructions   atenolol  (TENORMIN ) 12.5 mg, Oral, Daily   atorvastatin  (LIPITOR) 40 MG tablet TAKE 1 TABLET BY MOUTH EVERYDAY AT BEDTIME   Calcium  Carb-Cholecalciferol (CALCIUM  600 + D PO) 2 tablets, Daily   estradiol  (ESTRACE ) 0.1 MG/GM vaginal cream 1 Applicatorful, Vaginal, 3 times weekly   fluticasone  (FLONASE ) 50 MCG/ACT nasal spray 1 spray, 2 times daily   GLUCOSAMINE-CHONDROITIN PO 2 capsules, Daily   Lifitegrast  (XIIDRA ) 5 % SOLN 1 drop, Every evening   methylcellulose (CITRUCEL) oral powder 1 packet, Daily   mirabegron  ER (MYRBETRIQ ) 50 mg, Oral, Daily   Probiotic Product (PROBIOTIC PO) 1 tablet, Daily   triamterene -hydrochlorothiazide  (MAXZIDE -25) 37.5-25 MG tablet 1 tablet, Oral, Daily    Patient Active Problem List   Diagnosis Date Noted   Prediabetes 06/30/2024   Incisional hernia 07/06/2023   Graves disease 12/12/2022   Intramural uterine fibroid 04/30/2022   History of appendectomy 02/13/2022   Pelvic abscess in female 02/13/2022   History of exploratory laparotomy 02/06/2022   Urinary urgency 11/05/2019   Hyperlipidemia 11/05/2019   Hypertension 11/05/2019   History of Graves' disease 11/05/2019   History of HPV infection  09/09/2018      Review of Systems  All other systems reviewed and are negative.     Objective:     BP (!) 140/92   Pulse 64   Temp 98.2 F (36.8 C) (Oral)   Ht 5' 10 (1.778 m)   Wt 169 lb 1.6 oz (76.7 kg)   SpO2 96%   BMI 24.26 kg/m    Physical Exam Vitals reviewed.  Constitutional:      Appearance: Normal appearance. She is normal weight.  Cardiovascular:     Rate and Rhythm: Normal rate and regular rhythm.     Pulses: Normal pulses.     Heart sounds: Normal heart sounds. No murmur heard. Pulmonary:     Effort: Pulmonary effort is normal.     Breath sounds: Normal breath sounds. No wheezing.  Musculoskeletal:     Right lower leg: No edema.     Left lower leg: No edema.  Neurological:     Mental Status: She is alert and oriented to person, place, and time.  Psychiatric:        Mood and Affect: Mood normal.        Behavior: Behavior normal.      No results found for any visits on 06/30/24.    The 10-year ASCVD risk score (Arnett DK, et al., 2019) is: 24.2%    Assessment & Plan:  Prediabetes Assessment & Plan: A1C was 6 in January, will recheck A1C today with her CMP. She denies  any diabetic symptoms at this time.   Orders: -     Hemoglobin A1c; Future  Hypertension, unspecified type Assessment & Plan: Repeat BP today in office was elevated, I advised the patient to start checking her BP daily at home for the next 2 weeks and to message me the results. Will recheck kidney function today as well. Continue triamterene /hydrochlorothiazide  and atenolol  daily for now. We will likely need to see her back in a few months to recheck her BP in the office.   Orders: -     Comprehensive metabolic panel with GFR; Future     Return in about 6 months (around 12/31/2024) for AWV.    Heron CHRISTELLA Sharper, MD

## 2024-07-01 ENCOUNTER — Ambulatory Visit (INDEPENDENT_AMBULATORY_CARE_PROVIDER_SITE_OTHER): Admitting: Obstetrics & Gynecology

## 2024-07-01 ENCOUNTER — Other Ambulatory Visit (HOSPITAL_COMMUNITY)
Admission: RE | Admit: 2024-07-01 | Discharge: 2024-07-01 | Disposition: A | Discharge status: Home / Self Care | Source: Ambulatory Visit | Attending: Obstetrics & Gynecology | Admitting: Obstetrics & Gynecology

## 2024-07-01 ENCOUNTER — Encounter (HOSPITAL_BASED_OUTPATIENT_CLINIC_OR_DEPARTMENT_OTHER): Payer: Self-pay | Admitting: Obstetrics & Gynecology

## 2024-07-01 VITALS — BP 155/77 | HR 67 | Wt 168.4 lb

## 2024-07-01 DIAGNOSIS — Z9189 Other specified personal risk factors, not elsewhere classified: Secondary | ICD-10-CM | POA: Diagnosis not present

## 2024-07-01 DIAGNOSIS — D251 Intramural leiomyoma of uterus: Secondary | ICD-10-CM | POA: Diagnosis not present

## 2024-07-01 DIAGNOSIS — M85831 Other specified disorders of bone density and structure, right forearm: Secondary | ICD-10-CM

## 2024-07-01 DIAGNOSIS — M85832 Other specified disorders of bone density and structure, left forearm: Secondary | ICD-10-CM

## 2024-07-01 DIAGNOSIS — R87810 Cervical high risk human papillomavirus (HPV) DNA test positive: Secondary | ICD-10-CM | POA: Insufficient documentation

## 2024-07-01 DIAGNOSIS — N952 Postmenopausal atrophic vaginitis: Secondary | ICD-10-CM

## 2024-07-01 DIAGNOSIS — B977 Papillomavirus as the cause of diseases classified elsewhere: Secondary | ICD-10-CM

## 2024-07-01 MED ORDER — ESTRADIOL 0.1 MG/GM VA CREA: 1.0000 | TOPICAL_CREAM | VAGINAL | 3 refills | Status: DC

## 2024-07-01 NOTE — Progress Notes (Signed)
 Breast and Pelvic Exam Patient name: Caitlin Bowers MRN [id number]  Date of birth: November 27, 1951 Chief Complaint:   Breast and pelvic exam  History of Present Illness:   Caitlin Bowers is a 73 y.o. G70P0000 Caucasian female being seen today for breast and pelvic exam.  Denies vaginal bleeding and discharge.  She uses small amount of estradiol  cream externally.  Does need RF.  No LMP recorded. Patient is postmenopausal.   Last pap:  05/08/2023 neg with + HR HPV.  Last mammogram: 03/03/2024. Results were: normal. Last colonoscopy: 09/07/2021. F/u 7 years.       07/01/2024    8:33 AM 01/02/2024    9:19 AM 07/17/2023    2:37 PM 06/04/2023   10:27 AM 05/09/2023   10:57 AM  Depression screen PHQ 2/9  Decreased Interest 0 0 0 0 0  Down, Depressed, Hopeless 0 0 0 0 0  PHQ - 2 Score 0 0 0 0 0  Altered sleeping    0   Tired, decreased energy    0   Change in appetite    0   Feeling bad or failure about yourself     0   Trouble concentrating    0   Moving slowly or fidgety/restless    0   Suicidal thoughts    0   PHQ-9 Score    0      Review of Systems:   Pertinent items are noted in HPI Denies any pelvic pain, urinary or bowel changes Pertinent History Reviewed:  Reviewed past medical,surgical, social and family history.  Reviewed problem list, medications and allergies. Physical Assessment:   Vitals:   07/01/24 0833  BP: (!) 155/77  Pulse: 67  SpO2: 100%  Weight: 168 lb 6.4 oz (76.4 kg)  Body mass index is 24.16 kg/m.        Physical Examination:   General appearance - well appearing, and in no distress  Mental status - alert, oriented to person, place, and time  Psych:  She has a normal mood and affect  Skin - warm and dry, normal color, no suspicious lesions noted  Chest - effort normal, all lung fields clear to auscultation bilaterally  Heart - normal rate and regular rhythm  Neck:  midline trachea, no thyromegaly or nodules  Breasts - breasts appear normal,  no suspicious masses, no skin or nipple changes or  axillary nodes  Abdomen - soft, nontender, nondistended, no masses or organomegaly  Pelvic - VULVA: normal appearing vulva with no masses, tenderness or lesions   VAGINA: normal appearing vagina with normal color and discharge, no lesions   CERVIX: normal appearing cervix without discharge or lesions, no CMT  Thin prep pap is done with HR HPV cotesting  UTERUS: uterus is felt to be normal size, shape, consistency and nontender   ADNEXA: No adnexal masses or tenderness noted.  Rectal - normal rectal, good sphincter tone, no masses felt.   Extremities:  No swelling or varicosities noted  Chaperone present for exam  Results for orders placed or performed in visit on 06/30/24 (from the past 24 hours)  Hemoglobin A1c   Collection Time: 06/30/24 10:08 AM  Result Value Ref Range   Hgb A1c MFr Bld 6.1 4.6 - 6.5 %  CMP   Collection Time: 06/30/24 10:08 AM  Result Value Ref Range   Sodium 135 135 - 145 mEq/L   Potassium 4.5 3.5 - 5.1 mEq/L   Chloride 98 96 - 112 mEq/L  CO2 27 19 - 32 mEq/L   Glucose, Bld 92 70 - 99 mg/dL   BUN 18 6 - 23 mg/dL   Creatinine, Ser 8.98 0.40 - 1.20 mg/dL   Total Bilirubin 1.0 0.2 - 1.2 mg/dL   Alkaline Phosphatase 84 39 - 117 U/L   AST 19 0 - 37 U/L   ALT 19 0 - 35 U/L   Total Protein 7.2 6.0 - 8.3 g/dL   Albumin 4.5 3.5 - 5.2 g/dL   GFR 45.35 (L) >39.99 mL/min   Calcium  9.9 8.4 - 10.5 mg/dL    Assessment & Plan:  1. GYN exam for high-risk Medicare patient (Primary) - Pap smear updated today - Mammogram 02/2024 - Colonoscopy 09/2021 - Bone mineral density 2024 - lab work done with PCP, Dr. Ozell - vaccines reviewed/updated   2. Vaginal atrophy - estradiol  (ESTRACE ) 0.1 MG/GM vaginal cream; Place 1 Applicatorful vaginally 3 (three) times a week.  Dispense: 42.5 g; Refill: 3  3. High risk HPV infection - Cytology - PAP( [facility])  4. Intramural uterine fibroid - stable exam   5.  Osteopenia of both forearms - BMD 2024.  Exercising 4 times weekly.  - Taking calcium  and Vit D   Meds:  Meds ordered this encounter  Medications   estradiol  (ESTRACE ) 0.1 MG/GM vaginal cream    Sig: Place 1 Applicatorful vaginally 3 (three) times a week.    Dispense:  42.5 g    Refill:  3    Follow-up: Return in about 1 year (around 07/01/2025).  Ronal GORMAN Pinal, MD 07/01/2024 9:20 AM

## 2024-07-04 ENCOUNTER — Ambulatory Visit: Payer: Self-pay | Admitting: Family Medicine

## 2024-07-04 DIAGNOSIS — I10 Essential (primary) hypertension: Secondary | ICD-10-CM

## 2024-07-06 LAB — CYTOLOGY - PAP
Comment: NEGATIVE
Comment: NEGATIVE
Comment: NEGATIVE
Diagnosis: NEGATIVE
HPV 16: NEGATIVE
HPV 18 / 45: NEGATIVE
High risk HPV: POSITIVE — AB

## 2024-07-10 ENCOUNTER — Ambulatory Visit (HOSPITAL_BASED_OUTPATIENT_CLINIC_OR_DEPARTMENT_OTHER): Payer: Self-pay | Admitting: Obstetrics & Gynecology

## 2024-07-15 NOTE — Telephone Encounter (Signed)
 Spoke with patient. Results given as seen below from Dr.Miller. Patient verbalizes understanding. Patient is postmenopausal. Colposcopy scheduled for 08/28/2024 at 1:35 pm with Dr.Miller.   Instructions given. Motrin 800 mg po x , one hour before appointment with food. Patient agreeable and verbalized understanding of all instructions.

## 2024-07-15 NOTE — Telephone Encounter (Signed)
-----   Message from Ronal GORMAN Pinal sent at 07/10/2024  9:52 AM EDT ----- Please let pt know her pap was normal but HR HPV was still positive.  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  Needs colpo again. ----- Message ----- From: Interface, [facility] Sent: 07/06/2024   9:52 AM EDT To: Ronal GORMAN Pinal, MD

## 2024-07-16 MED ORDER — LISINOPRIL-HYDROCHLOROTHIAZIDE 20-25 MG PO TABS: 1.0000 | ORAL_TABLET | Freq: Every day | ORAL | 0 refills | Status: DC

## 2024-08-25 ENCOUNTER — Encounter: Payer: Self-pay | Admitting: Family Medicine

## 2024-08-25 ENCOUNTER — Ambulatory Visit (INDEPENDENT_AMBULATORY_CARE_PROVIDER_SITE_OTHER): Admitting: Family Medicine

## 2024-08-25 VITALS — BP 110/72 | HR 75 | Temp 98.3°F | Ht 70.0 in | Wt 169.8 lb

## 2024-08-25 DIAGNOSIS — I10 Essential (primary) hypertension: Secondary | ICD-10-CM | POA: Diagnosis not present

## 2024-08-25 LAB — BASIC METABOLIC PANEL WITH GFR
BUN: 21 mg/dL (ref 6–23)
CO2: 27 meq/L (ref 19–32)
Calcium: 9.7 mg/dL (ref 8.4–10.5)
Chloride: 99 meq/L (ref 96–112)
Creatinine, Ser: 1.22 mg/dL — ABNORMAL HIGH (ref 0.40–1.20)
GFR: 43.51 mL/min — ABNORMAL LOW (ref >60.00)
Glucose, Bld: 97 mg/dL (ref 70–99)
Potassium: 3.9 meq/L (ref 3.5–5.1)
Sodium: 133 meq/L — ABNORMAL LOW (ref 135–145)

## 2024-08-25 MED ORDER — LISINOPRIL-HYDROCHLOROTHIAZIDE 20-25 MG PO TABS: 1.0000 | ORAL_TABLET | Freq: Every day | ORAL | 1 refills | Status: DC

## 2024-08-25 NOTE — Progress Notes (Signed)
 Established Patient Office Visit  Subjective   Patient ID: Caitlin Bowers, female    DOB: [birth year]  Age: 73 y.o. MRN: [id number]  Chief Complaint  Patient presents with   Medical Management of Chronic Issues    HPI Discussed the use of AI scribe software for clinical note transcription with the patient, who gave verbal consent to proceed.  History of Present Illness   Caitlin Bowers is a 73 year old female with hypertension who presents for a follow-up visit after a medication change.  She is here for a follow-up visit after her hypertension medication was changed from Maxzide  (triamterene  HCT) to lisinopril  HCT. Her blood pressures have been normal at home, with a recent reading of 110/72, which is an improvement. No significant side effects from the new medication, although she mentions a 'tickle in the back of my throat' that started within the last month. She does not consider it bothersome and reports her usual postnasal drip.  She has a history of postnasal drip and uses Flonase  and saline nasal spray regularly. No new allergies or significant changes in her symptoms. No dry cough, swelling in ankles, or significant changes in symptoms. Reports a tickle in the throat and usual postnasal drip.  She has been monitoring her blood pressure at home and reports normal readings. She is currently on a 30-day supply of lisinopril  HCT.       Current Outpatient Medications  Medication Instructions   atenolol  (TENORMIN ) 12.5 mg, Oral, Daily   atorvastatin  (LIPITOR) 40 MG tablet TAKE 1 TABLET BY MOUTH EVERYDAY AT BEDTIME   Calcium  Carb-Cholecalciferol (CALCIUM  600 + D PO) 2 tablets, Daily   estradiol  (ESTRACE ) 0.1 MG/GM vaginal cream 1 Applicatorful, Vaginal, 3 times weekly   fluticasone  (FLONASE ) 50 MCG/ACT nasal spray 1 spray, 2 times daily   GLUCOSAMINE-CHONDROITIN PO 2 capsules, Daily   Lifitegrast  (XIIDRA ) 5 % SOLN 1 drop, Every evening    lisinopril -hydrochlorothiazide  (ZESTORETIC ) 20-25 MG tablet 1 tablet, Oral, Daily   methylcellulose (CITRUCEL) oral powder 1 packet, Daily   mirabegron  ER (MYRBETRIQ ) 50 mg, Oral, Daily   Probiotic Product (PROBIOTIC PO) 1 tablet, Daily    Patient Active Problem List   Diagnosis Date Noted   Prediabetes 06/30/2024   Incisional hernia 07/06/2023   Graves disease 12/12/2022   Intramural uterine fibroid 04/30/2022   History of appendectomy 02/13/2022   Pelvic abscess in female 02/13/2022   History of exploratory laparotomy 02/06/2022   Urinary urgency 11/05/2019   Hyperlipidemia 11/05/2019   Hypertension 11/05/2019   History of Graves' disease 11/05/2019   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     Objective:     BP 110/72   Pulse 75   Temp 98.3 F (36.8 C) (Oral)   Ht 5' 10 (1.778 m)   Wt 169 lb 12.8 oz (77 kg)   SpO2 98%   BMI 24.36 kg/m    Physical Exam Vitals reviewed.  Constitutional:      Appearance: Normal appearance. She is normal weight.  HENT:     Nose: No rhinorrhea.     Mouth/Throat:     Mouth: Mucous membranes are moist.     Comments: There is cobblestoning in the oropharynx Cardiovascular:     Rate and Rhythm: Normal rate and regular rhythm.     Heart sounds: Normal heart sounds. No murmur heard. Pulmonary:     Effort: Pulmonary effort  is normal.     Breath sounds: Normal breath sounds. No wheezing.  Neurological:     Mental Status: She is alert.      No results found for any visits on 08/25/24.    The 10-year ASCVD risk score (Arnett DK, et al., 2019) is: 15.7%    Assessment & Plan:  Hypertension, unspecified type -     Basic metabolic panel with GFR; Future -     Lisinopril -hydroCHLOROthiazide ; Take 1 tablet by mouth daily.  Dispense: 90 tablet; Refill: 1   Assessment and Plan    Hypertension Hypertension well-controlled with lisinopril  HCT. Blood pressure  normal. Mild throat tickle noted. Kidney function and potassium require monitoring. - Order kidney function test and potassium level. - Prescribe lisinopril  HCT for 90 days.  Chronic postnasal drip and chronic cough Chronic postnasal drip with mild throat tickle. Cough possibly due to postnasal drip or environmental factors. Lungs clear. - Continue Flonase  and saline nasal spray. - Monitor cough and report if persistent.  General Health Maintenance Flu, COVID, and RSV vaccinations up to date. - Advise updated COVID vaccine if desired.  Follow-up Next follow-up scheduled for January 2026 for annual wellness visit and blood work. - Schedule follow-up for January 02, 2025. - Plan annual blood work and A1c test at next visit.        Return in about 4 months (around 01/02/2025) for AWV.    Heron CHRISTELLA Sharper, MD

## 2024-08-28 ENCOUNTER — Encounter (HOSPITAL_BASED_OUTPATIENT_CLINIC_OR_DEPARTMENT_OTHER): Admitting: Obstetrics & Gynecology

## 2024-09-01 ENCOUNTER — Ambulatory Visit: Payer: Self-pay | Admitting: Family Medicine

## 2024-09-10 ENCOUNTER — Ambulatory Visit (HOSPITAL_BASED_OUTPATIENT_CLINIC_OR_DEPARTMENT_OTHER): Admitting: Obstetrics & Gynecology

## 2024-09-10 ENCOUNTER — Other Ambulatory Visit (HOSPITAL_COMMUNITY)
Admission: RE | Admit: 2024-09-10 | Discharge: 2024-09-10 | Disposition: A | Discharge status: Home / Self Care | Source: Ambulatory Visit | Attending: Obstetrics & Gynecology | Admitting: Obstetrics & Gynecology

## 2024-09-10 ENCOUNTER — Encounter (HOSPITAL_BASED_OUTPATIENT_CLINIC_OR_DEPARTMENT_OTHER): Payer: Self-pay | Admitting: Obstetrics & Gynecology

## 2024-09-10 VITALS — BP 154/74 | HR 65 | Ht 70.0 in | Wt 169.0 lb

## 2024-09-10 DIAGNOSIS — N870 Mild cervical dysplasia: Secondary | ICD-10-CM

## 2024-09-10 DIAGNOSIS — B977 Papillomavirus as the cause of diseases classified elsewhere: Secondary | ICD-10-CM | POA: Insufficient documentation

## 2024-09-10 DIAGNOSIS — N882 Stricture and stenosis of cervix uteri: Secondary | ICD-10-CM

## 2024-09-10 NOTE — Progress Notes (Signed)
 73 y.o. G44P0000 Married Not Hispanic or Latino female here for colposcopy with possible biopsies and/or ECC due to +HR HPV with negative 16/18/45 testing on Pap obtained 07/01/2024.    Prior evaluation/treatment:  07/17/2023 colposcopy.  No LMP recorded. Patient is postmenopausal.          Sexually active: Yes.    The current method of family planning is none.     Patient has been counseled about results and procedure.  Risks and benefits have bene reviewed including immediate and/or delayed bleeding, infection, cervical scaring from procedure, possibility of needing additional follow up as well as treatment.  Rare risks of missing a lesion discussed as well.  All questions answered.  Pt ready to proceed.  Consent obtained.  BP (!) 154/74 (BP Location: Right Arm, Patient Position: Sitting, Cuff Size: Large)   Pulse 65   Ht 5' 10 (1.778 m)   Wt 169 lb (76.7 kg)   SpO2 100%   BMI 24.25 kg/m   General appearance: alert, cooperative and appears stated age Lymph nodes: No abnormal inguinal nodes palpated Neurologic: Grossly normal  Pelvic: External genitalia:  no lesions              Urethra:  normal appearing urethra with no masses, tenderness or lesions              Bartholins and Skenes: normal                 Vagina: normal appearing vagina with normal color and no discharge, no lesions                Speculum placed.  3% acetic acid applied to cervix for >45 seconds.  Cervix visualized with both 7.5X and 15X magnification.  Green filter also used.  Lugols solution was used.  Findings:  no AWE or decreased staining with Lugol's solution.  Biopsy:  not obtained.  ECC:  attempted but cervical stenosis.  Able to pass cyto brush and attempted to obtain sample.  Pt did have discomfort so procedure was ended.  Suspect scant specimen but will sent for analysis.  Monsel's was not needed.  Pt tolerated procedure well and all instruments were removed.    Chaperone was present during  procedure.  Assessment/Plan: 1. 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  If sample is scant, will have pt return after cytotec and valium to see if can obtain better specimen.  2. Dysplasia of cervix, low grade (CIN 1)  3. Cervical stenosis (uterine cervix)

## 2024-09-12 LAB — SURGICAL PATHOLOGY

## 2024-09-15 ENCOUNTER — Ambulatory Visit (HOSPITAL_BASED_OUTPATIENT_CLINIC_OR_DEPARTMENT_OTHER): Payer: Self-pay | Admitting: Obstetrics & Gynecology

## 2024-09-20 ENCOUNTER — Other Ambulatory Visit (HOSPITAL_BASED_OUTPATIENT_CLINIC_OR_DEPARTMENT_OTHER): Payer: Self-pay | Admitting: Obstetrics & Gynecology

## 2024-09-20 DIAGNOSIS — N882 Stricture and stenosis of cervix uteri: Secondary | ICD-10-CM

## 2024-09-20 MED ORDER — MISOPROSTOL 200 MCG PO TABS: ORAL_TABLET | ORAL | 0 refills | Status: DC

## 2024-09-20 MED ORDER — DIAZEPAM 5 MG PO TABS: ORAL_TABLET | ORAL | 0 refills | Status: DC

## 2024-09-23 ENCOUNTER — Telehealth (HOSPITAL_BASED_OUTPATIENT_CLINIC_OR_DEPARTMENT_OTHER): Payer: Self-pay

## 2024-09-23 ENCOUNTER — Encounter (HOSPITAL_BASED_OUTPATIENT_CLINIC_OR_DEPARTMENT_OTHER): Payer: Self-pay | Admitting: Obstetrics & Gynecology

## 2024-09-23 NOTE — Telephone Encounter (Signed)
 Called patient about Prior Authorization for diazepam. Patient is going to go through [facility] to get the one tablet. She will give our office a call once she researches and find out which pharmacy she will be getting it from. tbw

## 2024-09-24 ENCOUNTER — Other Ambulatory Visit (HOSPITAL_BASED_OUTPATIENT_CLINIC_OR_DEPARTMENT_OTHER): Payer: Self-pay | Admitting: Obstetrics & Gynecology

## 2024-09-24 MED ORDER — DIAZEPAM 5 MG PO TABS: ORAL_TABLET | ORAL | 0 refills | Status: DC

## 2024-09-30 ENCOUNTER — Encounter (HOSPITAL_BASED_OUTPATIENT_CLINIC_OR_DEPARTMENT_OTHER): Payer: Self-pay | Admitting: Obstetrics & Gynecology

## 2024-09-30 ENCOUNTER — Other Ambulatory Visit (HOSPITAL_COMMUNITY)
Admission: RE | Admit: 2024-09-30 | Discharge: 2024-09-30 | Disposition: A | Discharge status: Home / Self Care | Source: Ambulatory Visit | Attending: Obstetrics & Gynecology | Admitting: Obstetrics & Gynecology

## 2024-09-30 ENCOUNTER — Ambulatory Visit (HOSPITAL_BASED_OUTPATIENT_CLINIC_OR_DEPARTMENT_OTHER): Admitting: Obstetrics & Gynecology

## 2024-09-30 VITALS — BP 140/78 | HR 72 | Ht 70.0 in | Wt 169.0 lb

## 2024-09-30 DIAGNOSIS — B977 Papillomavirus as the cause of diseases classified elsewhere: Secondary | ICD-10-CM

## 2024-09-30 DIAGNOSIS — N870 Mild cervical dysplasia: Secondary | ICD-10-CM

## 2024-09-30 NOTE — Progress Notes (Signed)
   GYNECOLOGY  VISIT  CC:   Repeat ECC   HPI: 73 y.o. G1P0000 Married   Other or two or more races White or Caucasian female here for ECC.  Used cytotec and valium before coming today.  No issues.  No LMP recorded. Patient is postmenopausal.  Past Medical History:  Diagnosis Date   Arthritis    RIGHT shoulder, bilateral hands   Cancer (HCC)    skin cancer   Cataract    bilateral sx   Diverticulitis    Graves disease    in remission as of 08/24/2021   Hyperlipidemia    on meds   Hypertension    on meds   Seasonal allergies    Urgency of urination     MEDS:  Reviewed in EPIC  ALLERGIES: Oxybutynin , Penicillins, and Sulfa antibiotics  SH:  married, non smoker  Review of Systems  Constitutional: Negative.   Genitourinary: Negative.     PHYSICAL EXAMINATION:    BP (!) 140/78 (BP Location: Left Arm, Patient Position: Sitting, Cuff Size: Normal)   Pulse 72   Ht 5' 10 (1.778 m)   Wt 169 lb (76.7 kg)   SpO2 100%   BMI 24.25 kg/m     General appearance: alert, cooperative and appears stated age  Pelvic: External genitalia:  no lesions              Urethra:  normal appearing urethra with no masses, tenderness or lesions              Bartholins and Skenes: normal                 Vagina: atrophic changes, no lesions              Cervix: No lesions    Tenaculum applied to anterior lip of cervix and cleansing cervix with Betadine x 3.  Cervix dilated with milex dilator. ECC obtained without difficulty and much better specimen that with colposcopy.  Pt tolerated procedure well.  Tenaculum removed.  Speculum removed.  Pt tolerated procedure well.      Chaperone was present for exam.  Assessment/Plan: 1. Dysplasia of cervix, low grade (CIN 1) (Primary) - Surgical pathology( [place]/ [facility])  2. High risk HPV infection

## 2024-10-03 LAB — SURGICAL PATHOLOGY

## 2024-10-07 ENCOUNTER — Ambulatory Visit (HOSPITAL_BASED_OUTPATIENT_CLINIC_OR_DEPARTMENT_OTHER): Payer: Self-pay | Admitting: Obstetrics & Gynecology

## 2024-10-07 ENCOUNTER — Ambulatory Visit: Payer: Self-pay

## 2024-10-07 ENCOUNTER — Ambulatory Visit (INDEPENDENT_AMBULATORY_CARE_PROVIDER_SITE_OTHER): Admitting: Family Medicine

## 2024-10-07 VITALS — BP 110/58 | HR 75 | Temp 98.2°F | Ht 70.0 in | Wt 170.3 lb

## 2024-10-07 DIAGNOSIS — R350 Frequency of micturition: Secondary | ICD-10-CM | POA: Diagnosis not present

## 2024-10-07 DIAGNOSIS — K5730 Diverticulosis of large intestine without perforation or abscess without bleeding: Secondary | ICD-10-CM | POA: Insufficient documentation

## 2024-10-07 DIAGNOSIS — R102 Pelvic and perineal pain unspecified side: Secondary | ICD-10-CM | POA: Diagnosis not present

## 2024-10-07 DIAGNOSIS — N3001 Acute cystitis with hematuria: Secondary | ICD-10-CM | POA: Diagnosis not present

## 2024-10-07 LAB — POC URINALSYSI DIPSTICK (AUTOMATED)
Bilirubin, UA: NEGATIVE
Glucose, UA: NEGATIVE
Ketones, UA: NEGATIVE
Nitrite, UA: NEGATIVE
Protein, UA: NEGATIVE
Spec Grav, UA: 1.01 (ref 1.010–1.025)
Urobilinogen, UA: 0.2 U/dL
pH, UA: 7 (ref 5.0–8.0)

## 2024-10-07 MED ORDER — CIPROFLOXACIN HCL 500 MG PO TABS: 500.0000 mg | ORAL_TABLET | Freq: Two times a day (BID) | ORAL | 0 refills | Status: AC

## 2024-10-07 NOTE — Telephone Encounter (Signed)
 FYI Only or Action Required?: Action required by provider: request for appointment.  Patient was last seen in primary care on 08/25/2024 by Caitlin Heron HERO, MD.  Called Nurse Triage reporting Abdominal Pain.  Symptoms began yesterday.  Interventions attempted: Nothing.  Symptoms are: gradually worsening. History of diverticulitis.  Triage Disposition: See Physician Within 24 Hours  Patient/caregiver understands and will follow disposition?: Yes   Copied from CRM #[id number]. Topic: Clinical - Red Word Triage >> Oct 07, 2024  8:05 AM Robinson DEL wrote: [facility] that prompted transfer to Nurse Triage: Thinks her Diverticulitis is acting up Lower stomach kept waking patient uo, nauseous and gassy. States provider previously talked about antibiotics if she had any issues Answer Assessment - Initial Assessment Questions 1. LOCATION: Where does it hurt?      Lower all across 2. RADIATION: Does the pain shoot anywhere else? (e.g., chest, back)     no 3. ONSET: When did the pain begin? (e.g., minutes, hours or days ago)      yesterday 4. SUDDEN: Gradual or sudden onset?     sudden 5. PATTERN Does the pain come and go, or is it constant?     Comes and goes 6. SEVERITY: How bad is the pain?  (e.g., Scale 1-10; mild, moderate, or severe)     5 7. RECURRENT SYMPTOM: Have you ever had this type of stomach pain before? If Yes, ask: When was the last time? and What happened that time?      yes 8. CAUSE: What do you think is causing the stomach pain? (e.g., gallstones, recent abdominal surgery)     diverticulitis 9. RELIEVING/AGGRAVATING FACTORS: What makes it better or worse? (e.g., antacids, bending or twisting motion, bowel movement)     no 10. OTHER SYMPTOMS: Do you have any other symptoms? (e.g., back pain, no diarrhea, fever, urination pain, vomiting)       gas 11. PREGNANCY: Is there any chance you are pregnant? When was your last menstrual period?        no  Protocols used: Abdominal Pain - Female-[facility]  Reason for Disposition  [1] MODERATE pain (e.g., interferes with normal activities) AND [2] pain comes and goes (cramps) AND [3] present > 24 hours  (Exception: Pain with Vomiting or Diarrhea - see that Guideline.)  Answer Assessment - Initial Assessment Questions 1. LOCATION: Where does it hurt?      Lower all across 2. RADIATION: Does the pain shoot anywhere else? (e.g., chest, back)     no 3. ONSET: When did the pain begin? (e.g., minutes, hours or days ago)      yesterday 4. SUDDEN: Gradual or sudden onset?     sudden 5. PATTERN Does the pain come and go, or is it constant?     Comes and goes 6. SEVERITY: How bad is the pain?  (e.g., Scale 1-10; mild, moderate, or severe)     5 7. RECURRENT SYMPTOM: Have you ever had this type of stomach pain before? If Yes, ask: When was the last time? and What happened that time?      yes 8. CAUSE: What do you think is causing the stomach pain? (e.g., gallstones, recent abdominal surgery)     diverticulitis 9. RELIEVING/AGGRAVATING FACTORS: What makes it better or worse? (e.g., antacids, bending or twisting motion, bowel movement)     no 10. OTHER SYMPTOMS: Do you have any other symptoms? (e.g., back pain, no diarrhea, fever, urination pain, vomiting)       gas  11. PREGNANCY: Is there any chance you are pregnant? When was your last menstrual period?       no  Protocols used: Abdominal Pain - [facility]

## 2024-10-07 NOTE — Telephone Encounter (Signed)
 Noted- ok to close.

## 2024-10-07 NOTE — Telephone Encounter (Signed)
 Appt today at 1pm

## 2024-10-07 NOTE — Progress Notes (Signed)
   Acute Office Visit  Subjective:     Patient ID: Caitlin Bowers, female    DOB: 1951-08-15, 73 y.o.   MRN: [id number]  Chief Complaint  Patient presents with   Pelvic Pain    Patient complains of pelvic pain and urinary frequency since last night    Pelvic Pain The patient's primary symptoms include pelvic pain.   Patient is in today for pelvic pain and urinary frequency for 1 day, she denies any fever or chills, she was worried that it might be her diverticulitis, states she has had problems with this in the past. States she had to get up to urinate multiple times and she felt a little nauseated this morning. UA reviewed with patient and is positive for UTI.   Review of Systems  Genitourinary:  Positive for pelvic pain.  All other systems reviewed and are negative.       Objective:    BP (!) 110/58   Pulse 75   Temp 98.2 F (36.8 C) (Oral)   Ht 5' 10 (1.778 m)   Wt 170 lb 4.8 oz (77.2 kg)   SpO2 97%   BMI 24.44 kg/m    Physical Exam Vitals reviewed.  Constitutional:      Appearance: Normal appearance. She is normal weight.  Cardiovascular:     Rate and Rhythm: Normal rate and regular rhythm.     Heart sounds: Normal heart sounds. No murmur heard. Pulmonary:     Effort: Pulmonary effort is normal.     Breath sounds: Normal breath sounds.  Abdominal:     General: Bowel sounds are normal.  Neurological:     Mental Status: She is alert.     Results for orders placed or performed in visit on 10/07/24  POCT Urinalysis Dipstick (Automated)  Result Value Ref Range   Color, UA yellow    Clarity, UA cloudy    Glucose, UA Negative Negative   Bilirubin, UA negative    Ketones, UA negative    Spec Grav, UA 1.010 1.010 - 1.025   Blood, UA 2+    pH, UA 7.0 5.0 - 8.0   Protein, UA Negative Negative   Urobilinogen, UA 0.2 0.2 or 1.0 E.U./dL   Nitrite, UA negative    Leukocytes, UA Large (3+) (A) Negative        Assessment & Plan:   Problem List  Items Addressed This Visit       Unprioritized   Diverticulosis of sigmoid colon   Other Visit Diagnoses       Pelvic pain    -  Primary   Relevant Orders   POCT Urinalysis Dipstick (Automated) (Completed)     Urinary frequency       Relevant Orders   POCT Urinalysis Dipstick (Automated) (Completed)     Acute cystitis with hematuria       Relevant Medications   ciprofloxacin (CIPRO) 500 MG tablet     Urine positive for blood and LE, UTI confirmed, will treat with cipro 500 mg BID for 5 days.   Meds ordered this encounter  Medications   ciprofloxacin (CIPRO) 500 MG tablet    Sig: Take 1 tablet (500 mg total) by mouth 2 (two) times daily for 5 days.    Dispense:  10 tablet    Refill:  0    No follow-ups on file.  Heron CHRISTELLA Sharper, MD

## 2024-11-30 ENCOUNTER — Emergency Department (HOSPITAL_BASED_OUTPATIENT_CLINIC_OR_DEPARTMENT_OTHER)

## 2024-11-30 ENCOUNTER — Encounter (HOSPITAL_BASED_OUTPATIENT_CLINIC_OR_DEPARTMENT_OTHER): Payer: Self-pay

## 2024-11-30 ENCOUNTER — Other Ambulatory Visit: Payer: Self-pay

## 2024-11-30 ENCOUNTER — Inpatient Hospital Stay (HOSPITAL_BASED_OUTPATIENT_CLINIC_OR_DEPARTMENT_OTHER)
Admission: EM | Admit: 2024-11-30 | Discharge: 2024-12-06 | DRG: 392 | Disposition: A | Discharge status: Home / Self Care | Attending: Internal Medicine | Admitting: Internal Medicine

## 2024-11-30 DIAGNOSIS — R197 Diarrhea, unspecified: Secondary | ICD-10-CM

## 2024-11-30 DIAGNOSIS — Z888 Allergy status to other drugs, medicaments and biological substances status: Secondary | ICD-10-CM

## 2024-11-30 DIAGNOSIS — E785 Hyperlipidemia, unspecified: Secondary | ICD-10-CM

## 2024-11-30 DIAGNOSIS — K5792 Diverticulitis of intestine, part unspecified, without perforation or abscess without bleeding: Secondary | ICD-10-CM | POA: Diagnosis not present

## 2024-11-30 DIAGNOSIS — K529 Noninfective gastroenteritis and colitis, unspecified: Secondary | ICD-10-CM | POA: Diagnosis present

## 2024-11-30 DIAGNOSIS — K5730 Diverticulosis of large intestine without perforation or abscess without bleeding: Secondary | ICD-10-CM | POA: Diagnosis present

## 2024-11-30 DIAGNOSIS — K5732 Diverticulitis of large intestine without perforation or abscess without bleeding: Principal | ICD-10-CM | POA: Diagnosis present

## 2024-11-30 DIAGNOSIS — Z860100 Personal history of colon polyps, unspecified: Secondary | ICD-10-CM

## 2024-11-30 DIAGNOSIS — I10 Essential (primary) hypertension: Secondary | ICD-10-CM | POA: Diagnosis not present

## 2024-11-30 DIAGNOSIS — E871 Hypo-osmolality and hyponatremia: Secondary | ICD-10-CM | POA: Diagnosis present

## 2024-11-30 DIAGNOSIS — Z85828 Personal history of other malignant neoplasm of skin: Secondary | ICD-10-CM

## 2024-11-30 DIAGNOSIS — Z79899 Other long term (current) drug therapy: Secondary | ICD-10-CM

## 2024-11-30 DIAGNOSIS — Z882 Allergy status to sulfonamides status: Secondary | ICD-10-CM

## 2024-11-30 DIAGNOSIS — D72829 Elevated white blood cell count, unspecified: Secondary | ICD-10-CM | POA: Diagnosis present

## 2024-11-30 DIAGNOSIS — E860 Dehydration: Secondary | ICD-10-CM | POA: Diagnosis present

## 2024-11-30 DIAGNOSIS — Z880 Allergy status to penicillin: Secondary | ICD-10-CM

## 2024-11-30 LAB — URINALYSIS, ROUTINE W REFLEX MICROSCOPIC
Bacteria, UA: NONE SEEN
Bilirubin Urine: NEGATIVE
Glucose, UA: NEGATIVE mg/dL
Ketones, ur: NEGATIVE mg/dL
Leukocytes,Ua: NEGATIVE
Nitrite: NEGATIVE
Protein, ur: NEGATIVE mg/dL
Specific Gravity, Urine: 1.045 — ABNORMAL HIGH (ref 1.005–1.030)
pH: 7 (ref 5.0–8.0)

## 2024-11-30 LAB — BASIC METABOLIC PANEL WITH GFR
Anion gap: 12 (ref 5–15)
BUN: 14 mg/dL (ref 8–23)
CO2: 24 mmol/L (ref 22–32)
Calcium: 9.4 mg/dL (ref 8.9–10.3)
Chloride: 100 mmol/L (ref 98–111)
Creatinine, Ser: 1.01 mg/dL — ABNORMAL HIGH (ref 0.44–1.00)
GFR, Estimated: 58 mL/min — ABNORMAL LOW
Glucose, Bld: 102 mg/dL — ABNORMAL HIGH (ref 70–99)
Potassium: 3.7 mmol/L (ref 3.5–5.1)
Sodium: 136 mmol/L (ref 135–145)

## 2024-11-30 LAB — OSMOLALITY, URINE: Osmolality, Ur: 410 mosm/kg (ref 300–900)

## 2024-11-30 LAB — COMPREHENSIVE METABOLIC PANEL WITH GFR
ALT: 16 U/L (ref 0–44)
AST: 22 U/L (ref 15–41)
Albumin: 4.4 g/dL (ref 3.5–5.0)
Alkaline Phosphatase: 106 U/L (ref 38–126)
Anion gap: 11 (ref 5–15)
BUN: 17 mg/dL (ref 8–23)
CO2: 22 mmol/L (ref 22–32)
Calcium: 10.1 mg/dL (ref 8.9–10.3)
Chloride: 96 mmol/L — ABNORMAL LOW (ref 98–111)
Creatinine, Ser: 1.08 mg/dL — ABNORMAL HIGH (ref 0.44–1.00)
GFR, Estimated: 53 mL/min — ABNORMAL LOW
Glucose, Bld: 119 mg/dL — ABNORMAL HIGH (ref 70–99)
Potassium: 4.3 mmol/L (ref 3.5–5.1)
Sodium: 129 mmol/L — ABNORMAL LOW (ref 135–145)
Total Bilirubin: 1.5 mg/dL — ABNORMAL HIGH (ref 0.0–1.2)
Total Protein: 7.3 g/dL (ref 6.5–8.1)

## 2024-11-30 LAB — CBC
HCT: 36.5 % (ref 36.0–46.0)
Hemoglobin: 12.4 g/dL (ref 12.0–15.0)
MCH: 31.6 pg (ref 26.0–34.0)
MCHC: 34 g/dL (ref 30.0–36.0)
MCV: 93.1 fL (ref 80.0–100.0)
Platelets: 231 K/uL (ref 150–400)
RBC: 3.92 MIL/uL (ref 3.87–5.11)
RDW: 13.2 % (ref 11.5–15.5)
WBC: 14.2 K/uL — ABNORMAL HIGH (ref 4.0–10.5)
nRBC: 0 % (ref 0.0–0.2)

## 2024-11-30 LAB — SODIUM, URINE, RANDOM: Sodium, Ur: 110 mmol/L

## 2024-11-30 LAB — LACTIC ACID, PLASMA: Lactic Acid, Venous: 1 mmol/L (ref 0.5–1.9)

## 2024-11-30 LAB — OSMOLALITY: Osmolality: 290 mosm/kg (ref 275–295)

## 2024-11-30 LAB — CREATININE, URINE, RANDOM: Creatinine, Urine: 44 mg/dL

## 2024-11-30 LAB — LIPASE, BLOOD: Lipase: 33 U/L (ref 11–51)

## 2024-11-30 MED ORDER — SODIUM CHLORIDE 0.9 % IV SOLN
2.0000 g | INTRAVENOUS | Status: DC
  Administered 2024-12-01: 2 g via INTRAVENOUS
  Filled 2024-11-30: qty 20

## 2024-11-30 MED ORDER — SODIUM CHLORIDE 0.9 % IV SOLN
2.0000 g | Freq: Once | INTRAVENOUS | Status: AC
  Administered 2024-11-30: 2 g via INTRAVENOUS
  Filled 2024-11-30: qty 20

## 2024-11-30 MED ORDER — LISINOPRIL-HYDROCHLOROTHIAZIDE 20-25 MG PO TABS: 1.0000 | ORAL_TABLET | Freq: Every day | ORAL | Status: DC

## 2024-11-30 MED ORDER — SODIUM CHLORIDE 0.9 % IV BOLUS
1000.0000 mL | Freq: Once | INTRAVENOUS | Status: AC
  Administered 2024-11-30: 1000 mL via INTRAVENOUS

## 2024-11-30 MED ORDER — ONDANSETRON HCL 4 MG PO TABS: 4.0000 mg | ORAL_TABLET | Freq: Four times a day (QID) | ORAL | Status: DC | PRN

## 2024-11-30 MED ORDER — ATORVASTATIN CALCIUM 40 MG PO TABS
40.0000 mg | ORAL_TABLET | Freq: Every day | ORAL | Status: DC
  Administered 2024-11-30 – 2024-12-05 (×6): 40 mg via ORAL
  Filled 2024-11-30: qty 2

## 2024-11-30 MED ORDER — LACTATED RINGERS IV BOLUS
1000.0000 mL | Freq: Once | INTRAVENOUS | Status: AC
  Administered 2024-11-30: 1000 mL via INTRAVENOUS

## 2024-11-30 MED ORDER — HYDROMORPHONE HCL 1 MG/ML IJ SOLN: 0.5000 mg | INTRAMUSCULAR | Status: DC | PRN

## 2024-11-30 MED ORDER — ONDANSETRON HCL 4 MG/2ML IJ SOLN: 4.0000 mg | Freq: Four times a day (QID) | INTRAMUSCULAR | Status: DC | PRN

## 2024-11-30 MED ORDER — ENOXAPARIN SODIUM 40 MG/0.4ML IJ SOSY
40.0000 mg | PREFILLED_SYRINGE | INTRAMUSCULAR | Status: DC
  Administered 2024-11-30 – 2024-12-01 (×2): 40 mg via SUBCUTANEOUS
  Filled 2024-11-30 (×2): qty 0.4

## 2024-11-30 MED ORDER — CIPROFLOXACIN IN D5W 400 MG/200ML IV SOLN
400.0000 mg | Freq: Two times a day (BID) | INTRAVENOUS | Status: DC
  Filled 2024-11-30: qty 200

## 2024-11-30 MED ORDER — FENTANYL CITRATE (PF) 50 MCG/ML IJ SOSY: 50.0000 ug | PREFILLED_SYRINGE | INTRAMUSCULAR | Status: DC | PRN

## 2024-11-30 MED ORDER — ATENOLOL 25 MG PO TABS
12.5000 mg | ORAL_TABLET | Freq: Every day | ORAL | Status: DC
  Administered 2024-12-01 – 2024-12-06 (×6): 12.5 mg via ORAL

## 2024-11-30 MED ORDER — LISINOPRIL 20 MG PO TABS
20.0000 mg | ORAL_TABLET | Freq: Every day | ORAL | Status: DC
  Administered 2024-12-01 – 2024-12-06 (×6): 20 mg via ORAL
  Filled 2024-11-30: qty 1

## 2024-11-30 MED ORDER — LIFITEGRAST 5 % OP SOLN
1.0000 [drp] | Freq: Every evening | OPHTHALMIC | Status: DC
  Administered 2024-11-30 – 2024-12-05 (×6): 1 [drp] via OPHTHALMIC

## 2024-11-30 MED ORDER — GLUCOSAMINE-CHONDROITIN PO CAPS: ORAL_CAPSULE | Freq: Every day | ORAL | Status: DC

## 2024-11-30 MED ORDER — SODIUM CHLORIDE 0.9 % IV SOLN: INTRAVENOUS | Status: AC

## 2024-11-30 MED ORDER — MIRABEGRON ER 25 MG PO TB24
50.0000 mg | ORAL_TABLET | Freq: Every day | ORAL | Status: DC
  Administered 2024-12-01 – 2024-12-06 (×6): 50 mg via ORAL
  Filled 2024-11-30 (×3): qty 2

## 2024-11-30 MED ORDER — HYDROCHLOROTHIAZIDE 25 MG PO TABS: 25.0000 mg | ORAL_TABLET | Freq: Every day | ORAL | Status: DC

## 2024-11-30 MED ORDER — IOHEXOL 300 MG/ML  SOLN
100.0000 mL | Freq: Once | INTRAMUSCULAR | Status: AC | PRN
  Administered 2024-11-30: 100 mL via INTRAVENOUS

## 2024-11-30 MED ORDER — METRONIDAZOLE 500 MG/100ML IV SOLN
500.0000 mg | Freq: Two times a day (BID) | INTRAVENOUS | Status: DC
  Administered 2024-11-30 – 2024-12-01 (×4): 500 mg via INTRAVENOUS
  Filled 2024-11-30 (×4): qty 100

## 2024-11-30 MED ORDER — SODIUM CHLORIDE 0.9 % IV SOLN: 2.0000 g | INTRAVENOUS | Status: DC

## 2024-11-30 NOTE — ED Notes (Signed)
 Called Nataya at [facility] for transport 13:38-TC

## 2024-11-30 NOTE — H&P (Signed)
 " [facility] History and Physical    Referring Provider: Veta Palma, PA-C  Telemedicine Provider: Elspeth Masters MD  Provider Location: [facility]  Patient Location: [facility] Referring Diagnosis: diverticulitis  Patient Name and DOB verified: Yes  Patient consented to Telemedicine Evaluation:yes RN virtual assistant: Toribio Jobs RN  Video encounter time and date:11/30/24 2pm   Additional Information:  Transfusion: non CPAP/BIPAP: none  O2:none  Foley: none  Patient: Caitlin Bowers FMW:[id number] DOB: 1951/10/04 DOA: 11/30/2024 DOS: the patient was seen and examined on 11/30/2024 PCP: Ozell Heron HERO, MD  Patient coming from: Home  Chief Complaint:  Chief Complaint  Patient presents with   Abdominal Pain   HPI: Caitlin Bowers is a 73 y.o. female with medical history significant of diverticulosis, hyperlipidemia, hypertension, urinary urgency presenting with diverticulitis.  Patient reports generalized lower abdominal pains over the past 4 to 5 days.  No reported recent change in diet.  Reports remote history of diverticulitis flares in the past.  Most recently was roughly 2 years ago.  Has had ex lap and appendectomy's in the past per the patient in addition to colonoscopy showing diverticulosis.  Patient unclear of her last colonoscopy exactly when it was done.  No recent dietary changes.  No chest pain or shortness of breath.  No focal hemiparesis or confusion.  Symptoms have progressively worsened at home with decreased p.o. intake, nausea, severe abdominal pain.  No reported fevers or chills.  Noted worsening malaise and fati presented to the ER Tmax 98.3, heart rate 90s, respiration teens, blood pressure 120s over 80s satting well on room air.  White count 14.2, hemoglobin 12.4, platelets 231, urinalysis grossly stable.  Lipase within normal limits.  Creatinine 1.08.  Sodium 129.  CT imaging showing severe mid sigmoid wall thickening and  inflammation compatible with acute colitis versus diverticulitis. Review of Systems: As mentioned in the history of present illness. All other systems reviewed and are negative. Past Medical History:  Diagnosis Date   Arthritis    RIGHT shoulder, bilateral hands   Cancer (HCC)    skin cancer   Cataract    bilateral sx   Diverticulitis    Graves disease    in remission as of 08/24/2021   Hyperlipidemia    on meds   Hypertension    on meds   Seasonal allergies    Urgency of urination    Past Surgical History:  Procedure Laterality Date   CATARACT EXTRACTION, BILATERAL Bilateral 02/2020   CERVICAL BIOPSY  W/ LOOP ELECTRODE EXCISION  2011   CESAREAN SECTION  1979   COLONOSCOPY  2017   UTSW, tics, 2 polyps, TA x 1, 5 yr recall   LAPAROTOMY N/A 02/05/2022   Procedure: EXPLORATORY LAPAROTOMY PROCTOSIGMOIDOSCOPY APPENDECTOMY;  Surgeon: Lyndel Deward PARAS, MD;  Location: [facility] OR;  Service: General;  Laterality: N/A;   POLYPECTOMY  2017   TA x 1   TONSILLECTOMY  1957   XI ROBOTIC ASSISTED VENTRAL HERNIA N/A 07/06/2023   Procedure: XI ROBOTIC ASSISTED INCISIONAL HERNIA REPAIR WITH MESH;  Surgeon: Lyndel Deward PARAS, MD;  Location: [facility];  Service: General;  Laterality: N/A;  MESH   Social History:  reports that she has never smoked. She has never used smokeless tobacco. She reports that she does not currently use alcohol after a past usage of about 5.0 standard drinks of alcohol per week. She reports that she does not use drugs.  Allergies[1]  Family History  Adopted: Yes  Problem Relation  Age of Onset   Breast cancer Neg Hx     Prior to Admission medications  Medication Sig Start Date End Date Taking? Authorizing Provider  atenolol  (TENORMIN ) 25 MG tablet Take 0.5 tablets (12.5 mg total) by mouth daily. 01/02/24   Ozell Heron HERO, MD  atorvastatin  (LIPITOR) 40 MG tablet TAKE 1 TABLET BY MOUTH EVERYDAY AT BEDTIME 01/02/24   Ozell Heron HERO, MD  Calcium  Carb-Cholecalciferol  (CALCIUM  600 + D PO) Take 2 tablets by mouth daily.    [provider]  diazepam  (VALIUM ) 5 MG tablet Take 1 tablet about 1 hour before procedure 09/24/24   Cleotilde Ronal RAMAN, MD  estradiol  (ESTRACE ) 0.1 MG/GM vaginal cream Place 1 Applicatorful vaginally 3 (three) times a week. 07/02/24   Cleotilde Ronal RAMAN, MD  fluticasone  (FLONASE ) 50 MCG/ACT nasal spray Place 1 spray into both nostrils 2 (two) times daily. 11/23/11   [provider]  GLUCOSAMINE-CHONDROITIN PO Take 2 capsules by mouth daily.    [provider]  Lifitegrast  (XIIDRA ) 5 % SOLN Place 1 drop into both eyes every evening.    [provider]  lisinopril -hydrochlorothiazide  (ZESTORETIC ) 20-25 MG tablet Take 1 tablet by mouth daily. 08/25/24   Ozell Heron HERO, MD  methylcellulose (CITRUCEL) oral powder Take 1 packet by mouth daily.    [provider]  mirabegron  ER (MYRBETRIQ ) 50 MG TB24 tablet Take 1 tablet (50 mg total) by mouth daily. 01/02/24   Ozell Heron HERO, MD  misoprostol  (CYTOTEC ) 200 MCG tablet Place one tablet as high in the vagina as possible the pm and am before procedure. 09/20/24   Cleotilde Ronal RAMAN, MD  Probiotic Product (PROBIOTIC PO) Take 1 tablet by mouth daily.    [provider]    Physical Exam: Vitals:   11/30/24 0951  BP: 120/86  Pulse: 94  Resp: 18  Temp: 98.3 F (36.8 C)  TempSrc: Oral  SpO2: 100%   Physical Exam Nursing note reviewed: Exam performed including cardiac, pulmonary, abdominal and skin  by Toribio PEAK.  Constitutional:      Appearance: She is normal weight.  HENT:     Head: Atraumatic.     Nose: Nose normal.     Mouth/Throat:     Mouth: Mucous membranes are moist.  Eyes:     Pupils: Pupils are equal, round, and reactive to light.  Cardiovascular:     Rate and Rhythm: Normal rate and regular rhythm.  Pulmonary:     Effort: Pulmonary effort is normal.  Abdominal:     General: Bowel sounds are normal.  Neurological:     General: No  focal deficit present.  Psychiatric:        Mood and Affect: Mood normal.     Data Reviewed:  There are no new results to review at this time.  CT ABDOMEN PELVIS W CONTRAST EXAM: CT ABDOMEN AND PELVIS WITH CONTRAST 11/30/2024 10:43:03 AM  TECHNIQUE: CT of the abdomen and pelvis was performed with the administration of 100 mL of iohexol  (OMNIPAQUE ) 300 MG/ML solution. Multiplanar reformatted images are provided for review. Automated exposure control, iterative reconstruction, and/or weight-based adjustment of the mA/kV was utilized to reduce the radiation dose to as low as reasonably achievable.  COMPARISON: CT abdomen and pelvis 09/28/2022.  CLINICAL HISTORY: 73 year old female with left-sided abdominal pain for 2 to 3 days, possible diverticulitis.  FINDINGS:  LOWER CHEST: Mildly lower lung volumes, minor lung base atelectasis or scarring. No pericardial or pleural effusion. Normal heart size.  LIVER: The liver is stable and within normal limits. Several small but circumscribed hypodense areas compatible with benign cysts, the largest have simple fluid density. No follow-up imaging recommended for these.  GALLBLADDER AND BILE DUCTS: Gallbladder is unremarkable. No biliary ductal dilatation.  SPLEEN: No acute abnormality.  PANCREAS: No acute abnormality.  ADRENAL GLANDS: No acute abnormality.  KIDNEYS, URETERS AND BLADDER: Small benign right renal mid pole cyst. No follow-up imaging recommended for this. No stones in the kidneys or ureters. No hydronephrosis. No perinephric or periureteral stranding. Urinary bladder is unremarkable.  GI AND BOWEL: Stomach and duodenum are decompressed. Redundant large bowel in the pelvis with extensive underlying diverticulosis. Upstream large bowel retained stool in the descending colon, retained fluid in the transverse and right colon, and similar fluid filled nondilated small bowel loops throughout much of the abdomen.  In the mid section of redundant sigmoid colon in the pelvis there is severe circumferential wall thickening and edema, surrounding mesenteric inflammatory stranding. A segment of about 10 cm of bowel is affected, and there are small gas containing diverticula occasionally noted in that segment. No extraluminal gas. Spiculated appearance of the large bowel wall thickening in some areas (coronal image 56). Distal sigmoid and rectum relatively spared. Secondary inflammation in the distal small bowel mesentery, no significantly inflamed small bowel loops. There is no bowel obstruction.  PERITONEUM AND RETROPERITONEUM: No ascites. No free air.  VASCULATURE: Calcified aortoiliac atherosclerosis is mild to moderate per age. Major arterial structures and the portal venous system in the abdomen and pelvis appear patent. Aorta is normal in caliber.  LYMPH NODES: No lymphadenopathy.  REPRODUCTIVE ORGANS: Chronic coarsely calcified fibroid uterus.  BONES AND SOFT TISSUES: Chronic lumbar spine degeneration in the setting of degenerative appearing lumbosacral junction ankylosis and adjacent segment grade 1 spondylolisthesis with advanced facet arthropathy. No acute osseous abnormality. No focal soft tissue abnormality.  IMPRESSION: 1. Severe mid 787sigmoid wall thickening and inflammation compatible with acute colitis/diverticulitis. Possible upstream superimposed enteritis/diarrhea. No perforation or abscess. Colonoscopy follow-up following symptom resolution would be ideal as tumor can sometimes present in a similar fashion.  Electronically signed by: Helayne Hurst MD 11/30/2024 12:06 PM EST RP Workstation: [id number]  Lab Results  Component Value Date   WBC 14.2 (H) 11/30/2024   HGB 12.4 11/30/2024   HCT 36.5 11/30/2024   MCV 93.1 11/30/2024   PLT 231 11/30/2024   Last metabolic panel Lab Results  Component Value Date   GLUCOSE 119 (H) 11/30/2024   NA 129 (L) 11/30/2024   K 4.3  11/30/2024   CL 96 (L) 11/30/2024   CO2 22 11/30/2024   BUN 17 11/30/2024   CREATININE 1.08 (H) 11/30/2024   GFRNONAA 53 (L) 11/30/2024   CALCIUM  10.1 11/30/2024   PROT 7.3 11/30/2024   ALBUMIN 4.4 11/30/2024   BILITOT 1.5 (H) 11/30/2024   ALKPHOS 106 11/30/2024   AST 22 11/30/2024   ALT 16 11/30/2024   ANIONGAP 11 11/30/2024    Assessment and Plan: * Diverticulitis 4-5 days of progressive lower abdominal pain, decreased po intake and nausea  Noted prior history of similar flares assd w/ diverticulitis- last flare 2 years ago  Unclear trigger per nursing  CT imaging showing severe sigmoid colon thickening concerning for diverticulitis vs. Colitis  Started on rocephin  and flagyl  for infectious coverage in setting of PCN allergy  Pain control  IVF hydration    Hyponatremia Na 129 on presentation  Clinically dry  IVF hydration  Trend Na  Goal 6-8 mEq  change over next 12-24 hours   Hypertension SBP 100s/ 80s  Titrate BP regimen w/ IVF hydration  Monitor    Hyperlipidemia Continue statin        Advance Care Planning:   Code Status: Prior   Consults: None   Family Communication: family at the bedside   Severity of Illness: The appropriate patient status for this patient is OBSERVATION. Observation status is judged to be reasonable and necessary in order to provide the required intensity of service to ensure the patient's safety. The patient's presenting symptoms, physical exam findings, and initial radiographic and laboratory data in the context of their medical condition is felt to place them at decreased risk for further clinical deterioration. Furthermore, it is anticipated that the patient will be medically stable for discharge from the hospital within 2 midnights of admission.   Author: Elspeth JINNY Masters, MD 11/30/2024 2:14 PM  For on call review [email or web address].     [1]  Allergies Allergen Reactions   Oxybutynin      Dry mouth; no taste; not effective for  incontinence   Penicillins Hives   Sulfa Antibiotics Hives   "

## 2024-11-30 NOTE — Plan of Care (Signed)
   Problem: Health Behavior/Discharge Planning: Goal: Ability to manage health-related needs will improve Outcome: Progressing   Problem: Clinical Measurements: Goal: Ability to maintain clinical measurements within normal limits will improve Outcome: Progressing Goal: Will remain free from infection Outcome: Progressing

## 2024-11-30 NOTE — Assessment & Plan Note (Signed)
 Resolved

## 2024-11-30 NOTE — Assessment & Plan Note (Signed)
 4-5 days of progressive lower abdominal pain, decreased po intake and nausea on presentation  Noted prior history of similar flares assd w/ diverticulitis- last flare 2 years ago  Unclear trigger per nursing  CT imaging showing severe sigmoid colon thickening concerning for diverticulitis vs. Colitis 11/30/24 Started on rocephin  and flagyl  for infectious coverage in setting of PCN allergy- Day 3 Advanced to full liquid diet-tolerating well  Prn percocet for pain  Monitor

## 2024-11-30 NOTE — ED Provider Notes (Signed)
 " [place] EMERGENCY DEPARTMENT AT [facility] Provider Note   CSN: [id number] Arrival date & time: 11/30/24  9072     Patient presents with: Abdominal Pain   Caitlin Bowers is a 73 y.o. female with history of skin cancer, hypertension, hyperlipidemia, diverticulitis, presents with concern for lower abdominal pain that has been ongoing for the past 3 days.  She reports that the pain is fairly constant.  She states this feels similar to previous episodes of diverticulitis in the past.  She denies any fever, chills at home.  No nausea, vomiting, or diarrhea.  She reports normal bowel movements.  She denies any dysuria, hematuria, increased frequency.  No flank pain.    Abdominal Pain      Prior to Admission medications  Medication Sig Start Date End Date Taking? Authorizing Provider  atenolol  (TENORMIN ) 25 MG tablet Take 0.5 tablets (12.5 mg total) by mouth daily. 01/02/24   Ozell Heron HERO, MD  atorvastatin  (LIPITOR) 40 MG tablet TAKE 1 TABLET BY MOUTH EVERYDAY AT BEDTIME 01/02/24   Ozell Heron HERO, MD  Calcium  Carb-Cholecalciferol (CALCIUM  600 + D PO) Take 2 tablets by mouth daily.    [provider]  diazepam  (VALIUM ) 5 MG tablet Take 1 tablet about 1 hour before procedure 09/24/24   Cleotilde Ronal RAMAN, MD  estradiol  (ESTRACE ) 0.1 MG/GM vaginal cream Place 1 Applicatorful vaginally 3 (three) times a week. 07/02/24   Cleotilde Ronal RAMAN, MD  fluticasone  (FLONASE ) 50 MCG/ACT nasal spray Place 1 spray into both nostrils 2 (two) times daily. 11/23/11   [provider]  GLUCOSAMINE-CHONDROITIN PO Take 2 capsules by mouth daily.    [provider]  Lifitegrast  (XIIDRA ) 5 % SOLN Place 1 drop into both eyes every evening.    [provider]  lisinopril -hydrochlorothiazide  (ZESTORETIC ) 20-25 MG tablet Take 1 tablet by mouth daily. 08/25/24   Ozell Heron HERO, MD  methylcellulose (CITRUCEL) oral powder Take 1 packet by mouth daily.    [provider]  mirabegron  ER (MYRBETRIQ ) 50 MG TB24 tablet Take 1 tablet (50 mg total) by mouth daily. 01/02/24   Ozell Heron HERO, MD  misoprostol  (CYTOTEC ) 200 MCG tablet Place one tablet as high in the vagina as possible the pm and am before procedure. 09/20/24   Cleotilde Ronal RAMAN, MD  Probiotic Product (PROBIOTIC PO) Take 1 tablet by mouth daily.    [provider]    Allergies: Oxybutynin , Penicillins, and Sulfa antibiotics    Review of Systems  Gastrointestinal:  Positive for abdominal pain.    Updated Vital Signs BP 120/86 (BP Location: Right Arm)   Pulse 94   Temp 98.3 F (36.8 C) (Oral)   Resp 18   SpO2 100%   Physical Exam Vitals and nursing note reviewed.  Constitutional:      General: She is not in acute distress.    Appearance: She is well-developed.  HENT:     Head: Normocephalic and atraumatic.  Eyes:     Conjunctiva/sclera: Conjunctivae normal.  Cardiovascular:     Rate and Rhythm: Normal rate and regular rhythm.     Heart sounds: No murmur heard. Pulmonary:     Effort: Pulmonary effort is normal. No respiratory distress.     Breath sounds: Normal breath sounds.  Abdominal:     Palpations: Abdomen is soft.     Tenderness: There is abdominal tenderness.     Comments: Tender in the lower abdomen diffusely   Musculoskeletal:  General: No swelling.     Cervical back: Neck supple.  Skin:    General: Skin is warm and dry.     Capillary Refill: Capillary refill takes less than 2 seconds.  Neurological:     Mental Status: She is alert.  Psychiatric:        Mood and Affect: Mood normal.     (all labs ordered are listed, but only abnormal results are displayed) Labs Reviewed  COMPREHENSIVE METABOLIC PANEL WITH GFR - Abnormal; Notable for the following components:      Result Value   Sodium 129 (*)    Chloride 96 (*)    Glucose, Bld 119 (*)    Creatinine, Ser 1.08 (*)    Total Bilirubin 1.5 (*)    GFR, Estimated 53 (*)    All other  components within normal limits  CBC - Abnormal; Notable for the following components:   WBC 14.2 (*)    All other components within normal limits  URINALYSIS, ROUTINE W REFLEX MICROSCOPIC - Abnormal; Notable for the following components:   Specific Gravity, Urine 1.045 (*)    Hgb urine dipstick TRACE (*)    All other components within normal limits  LIPASE, BLOOD  LACTIC ACID, PLASMA  OSMOLALITY, URINE  OSMOLALITY  SODIUM, URINE, RANDOM  CREATININE, URINE, RANDOM  BASIC METABOLIC PANEL WITH GFR    EKG: None  Radiology: CT ABDOMEN PELVIS W CONTRAST Result Date: 11/30/2024 EXAM: CT ABDOMEN AND PELVIS WITH CONTRAST 11/30/2024 10:43:03 AM TECHNIQUE: CT of the abdomen and pelvis was performed with the administration of 100 mL of iohexol  (OMNIPAQUE ) 300 MG/ML solution. Multiplanar reformatted images are provided for review. Automated exposure control, iterative reconstruction, and/or weight-based adjustment of the mA/kV was utilized to reduce the radiation dose to as low as reasonably achievable. COMPARISON: CT abdomen and pelvis 09/28/2022. CLINICAL HISTORY: 73 year old female with left-sided abdominal pain for 2 to 3 days, possible diverticulitis. FINDINGS: LOWER CHEST: Mildly lower lung volumes, minor lung base atelectasis or scarring. No pericardial or pleural effusion. Normal heart size. LIVER: The liver is stable and within normal limits. Several small but circumscribed hypodense areas compatible with benign cysts, the largest have simple fluid density. No follow-up imaging recommended for these. GALLBLADDER AND BILE DUCTS: Gallbladder is unremarkable. No biliary ductal dilatation. SPLEEN: No acute abnormality. PANCREAS: No acute abnormality. ADRENAL GLANDS: No acute abnormality. KIDNEYS, URETERS AND BLADDER: Small benign right renal mid pole cyst. No follow-up imaging recommended for this. No stones in the kidneys or ureters. No hydronephrosis. No perinephric or periureteral stranding.  Urinary bladder is unremarkable. GI AND BOWEL: Stomach and duodenum are decompressed. Redundant large bowel in the pelvis with extensive underlying diverticulosis. Upstream large bowel retained stool in the descending colon, retained fluid in the transverse and right colon, and similar fluid filled nondilated small bowel loops throughout much of the abdomen. In the mid section of redundant sigmoid colon in the pelvis there is severe circumferential wall thickening and edema, surrounding mesenteric inflammatory stranding. A segment of about 10 cm of bowel is affected, and there are small gas containing diverticula occasionally noted in that segment. No extraluminal gas. Spiculated appearance of the large bowel wall thickening in some areas (coronal image 56). Distal sigmoid and rectum relatively spared. Secondary inflammation in the distal small bowel mesentery, no significantly inflamed small bowel loops. There is no bowel obstruction. PERITONEUM AND RETROPERITONEUM: No ascites. No free air. VASCULATURE: Calcified aortoiliac atherosclerosis is mild to moderate per age. Major arterial structures and the  portal venous system in the abdomen and pelvis appear patent. Aorta is normal in caliber. LYMPH NODES: No lymphadenopathy. REPRODUCTIVE ORGANS: Chronic coarsely calcified fibroid uterus. BONES AND SOFT TISSUES: Chronic lumbar spine degeneration in the setting of degenerative appearing lumbosacral junction ankylosis and adjacent segment grade 1 spondylolisthesis with advanced facet arthropathy. No acute osseous abnormality. No focal soft tissue abnormality. IMPRESSION: 1. Severe mid 787sigmoid wall thickening and inflammation compatible with acute colitis/diverticulitis. Possible upstream superimposed enteritis/diarrhea. No perforation or abscess. Colonoscopy follow-up following symptom resolution would be ideal as tumor can sometimes present in a similar fashion. Electronically signed by: Helayne Hurst MD 11/30/2024  12:06 PM EST RP Workstation: [id number]     Procedures   Medications Ordered in the ED  fentaNYL  (SUBLIMAZE ) injection 50 mcg (has no administration in time range)  metroNIDAZOLE  (FLAGYL ) IVPB 500 mg (500 mg Intravenous New Bag/Given 11/30/24 1310)  cefTRIAXone  (ROCEPHIN ) 2 g in sodium chloride  0.9 % 100 mL IVPB (has no administration in time range)  atenolol  (TENORMIN ) tablet 12.5 mg (has no administration in time range)  atorvastatin  (LIPITOR) tablet 40 mg (has no administration in time range)  mirabegron  ER (MYRBETRIQ ) tablet 50 mg (has no administration in time range)  Lifitegrast  5 % SOLN 1 drop (has no administration in time range)  Glucosamine-Chondroitin CAPS (has no administration in time range)  lactated ringers  bolus 1,000 mL (has no administration in time range)  ondansetron  (ZOFRAN ) tablet 4 mg (has no administration in time range)    Or  ondansetron  (ZOFRAN ) injection 4 mg (has no administration in time range)  HYDROmorphone  (DILAUDID ) injection 0.5-1 mg (has no administration in time range)  lisinopril  (ZESTRIL ) tablet 20 mg (has no administration in time range)    And  hydrochlorothiazide  (HYDRODIURIL ) tablet 25 mg (has no administration in time range)  iohexol  (OMNIPAQUE ) 300 MG/ML solution 100 mL (100 mLs Intravenous Contrast Given 11/30/24 1043)  sodium chloride  0.9 % bolus 1,000 mL (0 mLs Intravenous Stopped 11/30/24 1349)    Clinical Course as of 11/30/24 1434  Sun Nov 30, 2024  1246 Patient evaluated patient, patient is vitally stable, has a history of diverticulitis, as well as abdominal abscess that has required prior exploratory laparotomy.  Patient has had 3 days of abdominal pain similar to prior diverticulitis.  Patient has leukocytosis, as well as evidence of severe inflammation of the sigmoid colon consistent with colitis/diverticulitis per radiology read, thus given patient's age, severe inflammation, do feel that she is most appropriate for admission for  IV antibiotics, patient is amenable to this plan. [LS]  1305 Consulted with Dr. Eldonna with hospitalist service who recommends admission.  [AF]  1308 Pharmacy recommending ceftriaxone  instead of ciprofloxacin  for better intra-abdominal coverage.   [AF]    Clinical Course User Index [AF] Veta Palma, PA-C [LS] Rogelia Jerilynn RAMAN, MD                                 Medical Decision Making Amount and/or Complexity of Data Reviewed Labs: ordered. Radiology: ordered.  Risk Prescription drug management. Decision regarding hospitalization.     Differential diagnosis includes but is not limited to acute cholecystitis, cholelithiasis, cholangitis, choledocholithiasis, peptic ulcer, gastritis, gastroenteritis, appendicitis, IBS, IBD, DKA, nephrolithiasis, UTI, pyelonephritis, pancreatitis, diverticulitis, mesenteric ischemia, abdominal aortic aneurysm, small bowel obstruction, volvulus   ED Course:  Upon initial evaluation, patient is well-appearing, no acute distress.  Normal vital signs.  Abdomen is tender diffusely across the lower  abdomen.  No rebound or guarding.  No active nausea or vomiting.  Labs Ordered: I Ordered, and personally interpreted labs.  The pertinent results include:   CBC with leukocytosis of 14.2 CMP with hyponatremia at 129, low chloride at 96.  Slightly elevated creatinine at 1.08 with GFR of 53.  T. bili elevated at 1.5 Lipase within normal limits Lactic acid within normal limits Urinalysis with a specific gravity, no signs of infection  Imaging Studies ordered: I ordered imaging studies including CT abdomen pelvis I independently visualized the imaging with scope of interpretation limited to determining acute life threatening conditions related to emergency care. Imaging showed  IMPRESSION:  1. Severe mid 787sigmoid wall thickening and inflammation compatible with acute  colitis/diverticulitis. Possible upstream superimposed enteritis/diarrhea. No   perforation or abscess. Colonoscopy follow-up following symptom resolution  would be ideal as tumor can sometimes present in a similar fashion.   I agree with the radiologist interpretation   Consultations Obtained: I requested consultation with the hospitalist Dr. Eldonna,  and discussed lab and imaging findings as well as pertinent plan - they recommend: Admission  Medications Given: Ceftriaxone  Metronidazole  NS bolus LR maintenance fluids  Upon re-evaluation, patient remains well-appearing with stable vitals.  She reports pain is well-controlled at this time and declines any pain medication.  Discussed with patient that CT scan is concerning for severe diverticulitis.  Patient states that with her diverticulitis previously, she had developed abscess and perforation and required an exploratory laparotomy.  Given the severe inflammation noted on CT scan and previous history, patient would benefit from admission for IV antibiotics and monitoring to ensure symptoms are improving before she goes home.  No other abnormalities noted on CT scan to explain her pain.  Urinalysis without signs of infection, low concern for UTI or pyelonephritis.  She does have a leukocytosis on labs, but no fever, tachycardia, no elevated lactic acid, no concern for sepsis at this time. Creatinine slightly elevated above baseline at 1.08 with decrease in GFR at 53.  She does have high specific gravity on urine.  Suspect the elevated creatinine is prerenal.  Will give fluids.    Impression: Diverticulitis  Disposition:  Admission with hospitalist Dr. Eldonna   This chart was dictated using voice recognition software, Dragon. Despite the best efforts of this provider to proofread and correct errors, errors may still occur which can change documentation meaning.       Final diagnoses:  Diverticulitis    ED Discharge Orders     None          Veta Palma, [place] 11/30/24 1434    Rogelia Jerilynn RAMAN, MD 11/30/24 1721  "

## 2024-11-30 NOTE — Progress Notes (Signed)
 Patient seen and examined.  She was admitted through telemedicine by Dr Eldonna presented with abdominal pain.  Denies diarrhea.  Found to have diverticulitis. Continue clear diet, IV antibiotics, hold hydrochlorothiazide  due to hyponatremia, Continue with IV fluids.  Physical exam: Patient alert and conversant, lungs clear to auscultation cardiovascular:  S1-S2 regular rhythm, rate, abdomen bowel sounds present, soft, generalized tenderness, no rigidity no guarding

## 2024-11-30 NOTE — Assessment & Plan Note (Signed)
"  Continue atorvastatin  40 mg daily  "

## 2024-11-30 NOTE — ED Triage Notes (Signed)
 She c/o left-sided abd. Pain x 2-3 days. She recognizes it as perhaps being diverticulitis; which she has had before.

## 2024-11-30 NOTE — ED Provider Triage Note (Signed)
 Emergency Medicine Provider Triage Evaluation Note  Caitlin Bowers , a 73 y.o. female  was evaluated in triage.  Pt complains of lower abdominal pain that started about 3 days. Feels similar to previous diverticulitis. No nausea, vomiting, fever, or chills. Normal bowel movements.   Review of Systems  Positive: As above  Negative: As above  Physical Exam  There were no vitals taken for this visit. Gen:   Awake, no distress   Resp:  Normal effort  MSK:   Moves extremities without difficulty    Medical Decision Making  Medically screening exam initiated at 9:41 AM.  Appropriate orders placed.  Caitlin Bowers was informed that the remainder of the evaluation will be completed by another provider, this initial triage assessment does not replace that evaluation, and the importance of remaining in the ED until their evaluation is complete.     Veta Palma, PA-C 11/30/24 [phone]

## 2024-11-30 NOTE — Assessment & Plan Note (Signed)
 SBP 100s/ 80s  Titrate BP regimen w/ IVF hydration  Monitor

## 2024-12-01 DIAGNOSIS — Z860100 Personal history of colon polyps, unspecified: Secondary | ICD-10-CM | POA: Diagnosis not present

## 2024-12-01 DIAGNOSIS — D72829 Elevated white blood cell count, unspecified: Secondary | ICD-10-CM | POA: Diagnosis present

## 2024-12-01 DIAGNOSIS — Z880 Allergy status to penicillin: Secondary | ICD-10-CM | POA: Diagnosis not present

## 2024-12-01 DIAGNOSIS — Z85828 Personal history of other malignant neoplasm of skin: Secondary | ICD-10-CM | POA: Diagnosis not present

## 2024-12-01 DIAGNOSIS — K5732 Diverticulitis of large intestine without perforation or abscess without bleeding: Secondary | ICD-10-CM | POA: Diagnosis present

## 2024-12-01 DIAGNOSIS — Z882 Allergy status to sulfonamides status: Secondary | ICD-10-CM | POA: Diagnosis not present

## 2024-12-01 DIAGNOSIS — K5792 Diverticulitis of intestine, part unspecified, without perforation or abscess without bleeding: Secondary | ICD-10-CM | POA: Diagnosis present

## 2024-12-01 DIAGNOSIS — Z79899 Other long term (current) drug therapy: Secondary | ICD-10-CM | POA: Diagnosis not present

## 2024-12-01 DIAGNOSIS — Z888 Allergy status to other drugs, medicaments and biological substances status: Secondary | ICD-10-CM | POA: Diagnosis not present

## 2024-12-01 DIAGNOSIS — E785 Hyperlipidemia, unspecified: Secondary | ICD-10-CM | POA: Diagnosis present

## 2024-12-01 DIAGNOSIS — E860 Dehydration: Secondary | ICD-10-CM | POA: Diagnosis present

## 2024-12-01 DIAGNOSIS — I10 Essential (primary) hypertension: Secondary | ICD-10-CM | POA: Diagnosis present

## 2024-12-01 DIAGNOSIS — E871 Hypo-osmolality and hyponatremia: Secondary | ICD-10-CM | POA: Diagnosis present

## 2024-12-01 DIAGNOSIS — K529 Noninfective gastroenteritis and colitis, unspecified: Secondary | ICD-10-CM | POA: Diagnosis present

## 2024-12-01 DIAGNOSIS — R197 Diarrhea, unspecified: Secondary | ICD-10-CM | POA: Diagnosis not present

## 2024-12-01 LAB — CBC
HCT: 31.8 % — ABNORMAL LOW (ref 36.0–46.0)
Hemoglobin: 10.5 g/dL — ABNORMAL LOW (ref 12.0–15.0)
MCH: 31.5 pg (ref 26.0–34.0)
MCHC: 33 g/dL (ref 30.0–36.0)
MCV: 95.5 fL (ref 80.0–100.0)
Platelets: 179 K/uL (ref 150–400)
RBC: 3.33 MIL/uL — ABNORMAL LOW (ref 3.87–5.11)
RDW: 13.1 % (ref 11.5–15.5)
WBC: 7.5 K/uL (ref 4.0–10.5)
nRBC: 0 % (ref 0.0–0.2)

## 2024-12-01 LAB — COMPREHENSIVE METABOLIC PANEL WITH GFR
ALT: 11 U/L (ref 0–44)
AST: 14 U/L — ABNORMAL LOW (ref 15–41)
Albumin: 3.6 g/dL (ref 3.5–5.0)
Alkaline Phosphatase: 86 U/L (ref 38–126)
Anion gap: 11 (ref 5–15)
BUN: 11 mg/dL (ref 8–23)
CO2: 23 mmol/L (ref 22–32)
Calcium: 9 mg/dL (ref 8.9–10.3)
Chloride: 103 mmol/L (ref 98–111)
Creatinine, Ser: 0.99 mg/dL (ref 0.44–1.00)
GFR, Estimated: 59 mL/min — ABNORMAL LOW
Glucose, Bld: 93 mg/dL (ref 70–99)
Potassium: 3.7 mmol/L (ref 3.5–5.1)
Sodium: 137 mmol/L (ref 135–145)
Total Bilirubin: 0.9 mg/dL (ref 0.0–1.2)
Total Protein: 6.1 g/dL — ABNORMAL LOW (ref 6.5–8.1)

## 2024-12-01 LAB — SODIUM, URINE, RANDOM: Sodium, Ur: 72 mmol/L

## 2024-12-01 LAB — OSMOLALITY, URINE: Osmolality, Ur: 390 mosm/kg (ref 300–900)

## 2024-12-01 LAB — CREATININE, URINE, RANDOM: Creatinine, Urine: 41 mg/dL

## 2024-12-01 MED ORDER — SODIUM CHLORIDE 0.9 % IV SOLN: INTRAVENOUS | Status: DC

## 2024-12-01 MED ORDER — OXYCODONE-ACETAMINOPHEN 7.5-325 MG PO TABS: 1.0000 | ORAL_TABLET | ORAL | Status: DC | PRN

## 2024-12-01 MED ORDER — FLUTICASONE PROPIONATE 50 MCG/ACT NA SUSP
1.0000 | Freq: Two times a day (BID) | NASAL | Status: DC
  Administered 2024-12-01 – 2024-12-06 (×10): 1 via NASAL
  Filled 2024-12-01 (×3): qty 16

## 2024-12-01 NOTE — Progress Notes (Signed)
 " PROGRESS NOTE    Caitlin Bowers  FMW:[id number] DOB: Oct 15, 1951 DOA: 11/30/2024 PCP: Ozell Heron HERO, MD   Brief Narrative: 73 year old with past medical history significant for diverticulosis, hyperlipidemia, hypertension, urinary urgency presents with abdominal pain, nausea found to have acute diverticulitis.  Evaluation in the ED white blood cell count 14, CT shows severe mid sigmoid wall thickening and inflammation compatible with acute colitis versus diverticulitis.   Assessment & Plan:   Principal Problem:   Diverticulitis Active Problems:   Hyperlipidemia   Hypertension   Hyponatremia   1-Acute diverticulitis: Patient presented with abdominal pain, nausea, poor oral intake, leukocytosis, CT consistent with acute diverticulitis. - Continue IV ceftriaxone  and Flagyl  -WBC trending down -Advanced diet to full liquid.   Hyponatremia: -In the setting of poor oral intake and dehydration. -Improved with IV fluids -Continue  to hold hydrochlorothiazide   Hypertension: - Continue to hold hydrochlorothiazide  due to hyponatremia -Continue atenolol  and lisinopril   Hyperlipidemia: - Continue Lipitor    Estimated body mass index is 24.36 kg/m as calculated from the following:   Height as of this encounter: 5' 10 (1.778 m).   Weight as of this encounter: 77 kg.   DVT prophylaxis: Lovenox  Code Status: Full code Family Communication: Discussed with patient Disposition Plan:  Status is: Observation The patient remains OBS appropriate and will d/c before 2 midnights.    Consultants:  None  Procedures:  None  Antimicrobials:    Subjective: She report no worsening abdominal [pain. Report pain 5/10 Feels hungry.  Had NM Bowel movement.    Objective: Vitals:   11/30/24 1715 11/30/24 2110 12/01/24 0047 12/01/24 0446  BP: (!) 157/74 133/72 119/71 129/62  Pulse: 78 73 72 68  Resp: 19 18 16 16   Temp: 98.7 F (37.1 C) 98.8 F (37.1 C) 98.7 F (37.1  C) 98.1 F (36.7 C)  TempSrc:  Oral Oral Oral  SpO2: 99% 97% 94% 97%  Weight:      Height:        Intake/Output Summary (Last 24 hours) at 12/01/2024 0732 Last data filed at 12/01/2024 9373 Gross per 24 hour  Intake 1610.94 ml  Output 600 ml  Net 1010.94 ml   Filed Weights   11/30/24 1440  Weight: 77 kg    Examination:  General exam: Appears calm and comfortable  Respiratory system: Clear to auscultation. Respiratory effort normal. Cardiovascular system: S1 & S2 heard, RRR. Gastrointestinal system: Abdomen is nondistended, soft and  less tender on palpation.  Central nervous system: Alert and oriented.  Extremities: Symmetric 5 x 5 power.   Data Reviewed: I have personally reviewed following labs and imaging studies  CBC: Recent Labs  Lab 11/30/24 0953 12/01/24 0506  WBC 14.2* 7.5  HGB 12.4 10.5*  HCT 36.5 31.8*  MCV 93.1 95.5  PLT 231 179   Basic Metabolic Panel: Recent Labs  Lab 11/30/24 0953 11/30/24 1502 12/01/24 0506  NA 129* 136 137  K 4.3 3.7 3.7  CL 96* 100 103  CO2 22 24 23   GLUCOSE 119* 102* 93  BUN 17 14 11   CREATININE 1.08* 1.01* 0.99  CALCIUM  10.1 9.4 9.0   GFR: Estimated Creatinine Clearance: 53.1 mL/min (by C-G formula based on SCr of 0.99 mg/dL). Liver Function Tests: Recent Labs  Lab 11/30/24 0953 12/01/24 0506  AST 22 14*  ALT 16 11  ALKPHOS 106 86  BILITOT 1.5* 0.9  PROT 7.3 6.1*  ALBUMIN 4.4 3.6   Recent Labs  Lab 11/30/24 0953  LIPASE  33   No results for input(s): AMMONIA in the last 168 hours. Coagulation Profile: No results for input(s): INR, PROTIME in the last 168 hours. Cardiac Enzymes: No results for input(s): CKTOTAL, CKMB, CKMBINDEX, TROPONINI in the last 168 hours. BNP (last 3 results) No results for input(s): PROBNP in the last 8760 hours. HbA1C: No results for input(s): HGBA1C in the last 72 hours. CBG: No results for input(s): GLUCAP in the last 168 hours. Lipid Profile: No  results for input(s): CHOL, HDL, LDLCALC, TRIG, CHOLHDL, LDLDIRECT in the last 72 hours. Thyroid  Function Tests: No results for input(s): TSH, T4TOTAL, FREET4, T3FREE, THYROIDAB in the last 72 hours. Anemia Panel: No results for input(s): VITAMINB12, FOLATE, FERRITIN, TIBC, IRON, RETICCTPCT in the last 72 hours. Sepsis Labs: Recent Labs  Lab 11/30/24 1307  LATICACIDVEN 1.0    No results found for this or any previous visit (from the past 240 hours).       Radiology Studies: CT ABDOMEN PELVIS W CONTRAST Result Date: 11/30/2024 EXAM: CT ABDOMEN AND PELVIS WITH CONTRAST 11/30/2024 10:43:03 AM TECHNIQUE: CT of the abdomen and pelvis was performed with the administration of 100 mL of iohexol  (OMNIPAQUE ) 300 MG/ML solution. Multiplanar reformatted images are provided for review. Automated exposure control, iterative reconstruction, and/or weight-based adjustment of the mA/kV was utilized to reduce the radiation dose to as low as reasonably achievable. COMPARISON: CT abdomen and pelvis 09/28/2022. CLINICAL HISTORY: 73 year old female with left-sided abdominal pain for 2 to 3 days, possible diverticulitis. FINDINGS: LOWER CHEST: Mildly lower lung volumes, minor lung base atelectasis or scarring. No pericardial or pleural effusion. Normal heart size. LIVER: The liver is stable and within normal limits. Several small but circumscribed hypodense areas compatible with benign cysts, the largest have simple fluid density. No follow-up imaging recommended for these. GALLBLADDER AND BILE DUCTS: Gallbladder is unremarkable. No biliary ductal dilatation. SPLEEN: No acute abnormality. PANCREAS: No acute abnormality. ADRENAL GLANDS: No acute abnormality. KIDNEYS, URETERS AND BLADDER: Small benign right renal mid pole cyst. No follow-up imaging recommended for this. No stones in the kidneys or ureters. No hydronephrosis. No perinephric or periureteral stranding. Urinary bladder is  unremarkable. GI AND BOWEL: Stomach and duodenum are decompressed. Redundant large bowel in the pelvis with extensive underlying diverticulosis. Upstream large bowel retained stool in the descending colon, retained fluid in the transverse and right colon, and similar fluid filled nondilated small bowel loops throughout much of the abdomen. In the mid section of redundant sigmoid colon in the pelvis there is severe circumferential wall thickening and edema, surrounding mesenteric inflammatory stranding. A segment of about 10 cm of bowel is affected, and there are small gas containing diverticula occasionally noted in that segment. No extraluminal gas. Spiculated appearance of the large bowel wall thickening in some areas (coronal image 56). Distal sigmoid and rectum relatively spared. Secondary inflammation in the distal small bowel mesentery, no significantly inflamed small bowel loops. There is no bowel obstruction. PERITONEUM AND RETROPERITONEUM: No ascites. No free air. VASCULATURE: Calcified aortoiliac atherosclerosis is mild to moderate per age. Major arterial structures and the portal venous system in the abdomen and pelvis appear patent. Aorta is normal in caliber. LYMPH NODES: No lymphadenopathy. REPRODUCTIVE ORGANS: Chronic coarsely calcified fibroid uterus. BONES AND SOFT TISSUES: Chronic lumbar spine degeneration in the setting of degenerative appearing lumbosacral junction ankylosis and adjacent segment grade 1 spondylolisthesis with advanced facet arthropathy. No acute osseous abnormality. No focal soft tissue abnormality. IMPRESSION: 1. Severe mid 787sigmoid wall thickening and inflammation compatible with acute  colitis/diverticulitis. Possible upstream superimposed enteritis/diarrhea. No perforation or abscess. Colonoscopy follow-up following symptom resolution would be ideal as tumor can sometimes present in a similar fashion. Electronically signed by: Helayne Hurst MD 11/30/2024 12:06 PM EST RP  Workstation: [id number]        Scheduled Meds:  atenolol   12.5 mg Oral Daily   atorvastatin   40 mg Oral Daily   enoxaparin  (LOVENOX ) injection  40 mg Subcutaneous Q24H   Lifitegrast   1 drop Both Eyes QPM   lisinopril   20 mg Oral Daily   mirabegron  ER  50 mg Oral Daily   Continuous Infusions:  sodium chloride  100 mL/hr at 11/30/24 2222   cefTRIAXone  (ROCEPHIN )  IV     metronidazole  500 mg (12/01/24 0014)     LOS: 0 days    Time spent: 35 Minutes    Orvie Caradine A Naim Murtha, MD [facility]   If 7PM-7AM, please contact night-coverage [email or web address]  12/01/2024, 7:32 AM   "

## 2024-12-01 NOTE — Progress Notes (Signed)
" °   12/01/24 1324  TOC Brief Assessment  Insurance and Status Reviewed  Patient has primary care physician Yes  Home environment has been reviewed resides in a private residence  Prior level of function: Independent  Prior/Current Home Services No current home services  Social Drivers of Health Review SDOH reviewed no interventions necessary  Readmission risk has been reviewed Yes  Transition of care needs no transition of care needs at this time    "

## 2024-12-02 DIAGNOSIS — E871 Hypo-osmolality and hyponatremia: Secondary | ICD-10-CM | POA: Diagnosis not present

## 2024-12-02 DIAGNOSIS — I10 Essential (primary) hypertension: Secondary | ICD-10-CM | POA: Diagnosis not present

## 2024-12-02 DIAGNOSIS — K5792 Diverticulitis of intestine, part unspecified, without perforation or abscess without bleeding: Secondary | ICD-10-CM | POA: Diagnosis not present

## 2024-12-02 LAB — BASIC METABOLIC PANEL WITH GFR
Anion gap: 12 (ref 5–15)
BUN: 10 mg/dL (ref 8–23)
CO2: 21 mmol/L — ABNORMAL LOW (ref 22–32)
Calcium: 9 mg/dL (ref 8.9–10.3)
Chloride: 105 mmol/L (ref 98–111)
Creatinine, Ser: 0.86 mg/dL (ref 0.44–1.00)
GFR, Estimated: >60 mL/min
Glucose, Bld: 95 mg/dL (ref 70–99)
Potassium: 4 mmol/L (ref 3.5–5.1)
Sodium: 138 mmol/L (ref 135–145)

## 2024-12-02 LAB — CBC
HCT: 32.9 % — ABNORMAL LOW (ref 36.0–46.0)
Hemoglobin: 11 g/dL — ABNORMAL LOW (ref 12.0–15.0)
MCH: 31.2 pg (ref 26.0–34.0)
MCHC: 33.4 g/dL (ref 30.0–36.0)
MCV: 93.2 fL (ref 80.0–100.0)
Platelets: 185 K/uL (ref 150–400)
RBC: 3.53 MIL/uL — ABNORMAL LOW (ref 3.87–5.11)
RDW: 12.6 % (ref 11.5–15.5)
WBC: 5.4 K/uL (ref 4.0–10.5)
nRBC: 0 % (ref 0.0–0.2)

## 2024-12-02 MED ORDER — OXYCODONE-ACETAMINOPHEN 5-325 MG PO TABS
1.0000 | ORAL_TABLET | Freq: Four times a day (QID) | ORAL | Status: DC | PRN
  Administered 2024-12-02: 1 via ORAL
  Filled 2024-12-02 (×9): qty 1

## 2024-12-02 MED ORDER — METRONIDAZOLE 500 MG/100ML IV SOLN
500.0000 mg | Freq: Two times a day (BID) | INTRAVENOUS | Status: DC
  Administered 2024-12-02 – 2024-12-04 (×6): 500 mg via INTRAVENOUS
  Filled 2024-12-02 (×7): qty 100

## 2024-12-02 MED ORDER — SODIUM CHLORIDE 0.9 % IV SOLN
2.0000 g | Freq: Every day | INTRAVENOUS | Status: DC
  Administered 2024-12-02 – 2024-12-06 (×5): 2 g via INTRAVENOUS
  Filled 2024-12-02 (×5): qty 20

## 2024-12-02 NOTE — Progress Notes (Signed)
 This clinical research associate came to room 3 East room 1319 to talk to patient about admission. Patient is A&O x4, patient is able to verify her address, allergies, phone number, emergency contact. The patient states she has no pets, 3-5 steps to get into her house, able to walk with no medical devices, she is not suicidal. The patient states there are firearms in the home, located in a safe. Patient states she is tired, has not slept in 2 nights due to all of the noise and staff coming in her room. She does not complain of any pain, just tenderness across her lower abdomen. This clinical research associate went over the [facility] program again with her. The patient has no questions for this clinical research associate. This clinical research associate will pick patient up after her antibiotics have finished. ------------------------------------------------------------------------------------- Nat SQUIBB. Franchot, EMT

## 2024-12-02 NOTE — Progress Notes (Signed)
 Admitted patient to hospital at home program. Discussed Hospital at home policies regarding home medications, taking only hospital dispensed medications, how to call nurse,paramedic /EMT visit bid and camera visit with MD and RN daily and prn,the need to call RN for supervision on any medications taken outside window of paramedic visit,current health equipment.All questions answered.  Plan of care and education initiated.

## 2024-12-02 NOTE — Progress Notes (Signed)
 This clinical research associate and EMT, Garrel went to [facility], spoke with diplomatic services operational officer and picked up patients shadow chat. Spoke to the nurse about taking the patient onto the [facility] at [facility] program and answered her questions. Walked into room 1319 found the patient lying in the bed. Her IV was still going, Garrel, EMT went to get the nurse to D/C it. This clinical research associate gathered all of the patients belongings, patient walked into the bathroom and was able to get dressed on her own. The patient came out and sat into the wheelchair. This clinical research associate handed her her belongings, purse, bag, chargers, Ipad. This clinical research associate pushed the patient to the wheelchair van. The patient was loaded into the van and secured the wheelchair via 2 front hooks, 2 rear hooks, 2 wheelchair locks and lap/shoulder seatbelt.   The patient was transported home safely. Patient walked into the house with no problem. Patients vitals were taken, and uploaded into EPIC. This clinical research associate scanned the house for safety measures, the house is clean and clear from clutter. This clinical research associate explained the specifics to the patient and her husband, patient placed a test call with Jon, CHARITY FUNDRAISER. The patient did not have any questions for the team. Medications were put into the clear bins. This clinical research associate left the patients house.-------------------------------------------------------------------------- [employer]. Franchot, EMT

## 2024-12-02 NOTE — Progress Notes (Signed)
 PT Cancellation Note  Patient Details Name: Caitlin Bowers MRN: [id number] DOB: 1951-01-23   Cancelled Treatment:    Reason Eval/Treat Not Completed: PT screened, no needs identified, will sign off    Dannial SQUIBB, PT Acute Rehabilitation  Office: [phone]

## 2024-12-02 NOTE — Plan of Care (Addendum)
 " Hospital at Home Interim Note     Caitlin Bowers   FMW:[id number]  DOB: 06-16-1951  DOA: 11/30/2024     1 Date of Service: 12/02/2024   Brief Narrative:  Caitlin Bowers is a 73 y.o. female with medical history significant of diverticulosis, hyperlipidemia, hypertension, urinary urgency presenting with diverticulitis.  Patient reports generalized lower abdominal pains over the past 4 to 5 days.  No reported recent change in diet.  Reports remote history of diverticulitis flares in the past.  Most recently was roughly 2 years ago.  Has had ex lap and appendectomy's in the past per the patient in addition to colonoscopy showing diverticulosis.  Patient unclear of her last colonoscopy exactly when it was done.  No recent dietary changes.  No chest pain or shortness of breath.  No focal hemiparesis or confusion.  Symptoms have progressively worsened at home with decreased p.o. intake, nausea, severe abdominal pain.  No reported fevers or chills.  Noted worsening malaise and fati presented to the ER Tmax 98.3, heart rate 90s, respiration teens, blood pressure 120s over 80s satting well on room air.  White count 14.2, hemoglobin 12.4, platelets 231, urinalysis grossly stable.  Lipase within normal limits.  Creatinine 1.08.  Sodium 129.  CT imaging showing severe mid sigmoid wall thickening and inflammation compatible with acute colitis versus diverticulitis.   Subjective:  Milt to moderate improvement in abdominal pain. Mild cough. No chest pain, No nausea or vomiting. Tolerating a full liquid diet.   Hospital Problems Assessment and Plan: * Diverticulitis 4-5 days of progressive lower abdominal pain, decreased po intake and nausea on presentation  Noted prior history of similar flares assd w/ diverticulitis- last flare 2 years ago  Unclear trigger per nursing  CT imaging showing severe sigmoid colon thickening concerning for diverticulitis vs. Colitis 11/30/24 Started on rocephin  and  flagyl  for infectious coverage in setting of PCN allergy- Day 3 Advanced to full liquid diet-tolerating well  Prn percocet for pain  Monitor    Hyponatremia Initial Na 129 on presentation  Now resolved  Compensated hydration status    Hypertension SBP 100s/ 80s  Titrate BP regimen w/ IVF hydration  Monitor    Hyperlipidemia Continue statin          Objective Vital signs were reviewed and unremarkable. Vitals:   12/01/24 0925 12/01/24 1355 12/01/24 2045 12/02/24 0922  BP: (!) 158/69 (!) 141/68 Comment: notify to the nurse. 136/72 136/72  Pulse: 66 67 65   Temp:  97.7 F (36.5 C) 98.1 F (36.7 C)   Resp:  16 18   Height:      Weight:      SpO2:  99% 95%   TempSrc:  Oral Oral   BMI (Calculated):        Exam Physical Exam HENT:     Head: Normocephalic and atraumatic.     Nose: Nose normal.  Eyes:     Pupils: Pupils are equal, round, and reactive to light.  Cardiovascular:     Rate and Rhythm: Normal rate and regular rhythm.  Pulmonary:     Effort: Pulmonary effort is normal.  Abdominal:     General: Bowel sounds are normal.     Comments: Mild to moderate lower abdominal TTP on exam  + bowel sounds    Musculoskeletal:        General: Normal range of motion.  Skin:    General: Skin is warm.  Neurological:     General: No  focal deficit present.     Mental Status: She is alert.  Psychiatric:        Mood and Affect: Mood normal.      Labs / Other Information There are no new results to review at this time. Lab Results  Component Value Date   WBC 5.4 12/02/2024   HGB 11.0 (L) 12/02/2024   HCT 32.9 (L) 12/02/2024   MCV 93.2 12/02/2024   PLT 185 12/02/2024   Last metabolic panel Lab Results  Component Value Date   GLUCOSE 95 12/02/2024   NA 138 12/02/2024   K 4.0 12/02/2024   CL 105 12/02/2024   CO2 21 (L) 12/02/2024   BUN 10 12/02/2024   CREATININE 0.86 12/02/2024   GFRNONAA >60 12/02/2024   CALCIUM  9.0 12/02/2024   PROT 6.1 (L)  12/01/2024   ALBUMIN 3.6 12/01/2024   BILITOT 0.9 12/01/2024   ALKPHOS 86 12/01/2024   AST 14 (L) 12/01/2024   ALT 11 12/01/2024   ANIONGAP 12 12/02/2024   Hospital at Home Admission Criteria Checklist:  Formal consent explained in detail and signed at the bedside: yes Patient meets inpatient admission criteria (see below for further details) yes Is pt Medicare FFS/[employer] Medicare-Medicaid, [employer], [employer] Medicare, [employer]age, [employer] ( required for initial launch with plan to expand)? yes Lives within 25 mil/ 30 min from [facility] within [place](pt may stay with family member during admission who lives within 25 miles or 30 min from [facility])? yes Hemodynamically stable with relatively low risk of clinical deterioration-not requiring ICU? yes Age >55? yes Does not require frequent touch-points or complex interventions/medications (ie Titrated Infusions (IV insulin, heparin  drips, vasoactive drips, use of infused or injectable controlled substances, patients on insulin)? no Any Behavioral Health comorbidities likely to increase risk for in-home care (ie Acute delirium or experiencing a marked altered mental status and cause is not a treatable condition in the home)? no Has the patient been on BIPAP during course of ED evaluation or hospitalization? no IF YES, Has the patient been off of BIPAP for >24 hours(If NO-THEN PATIENT DOES MEET INCLUSION CRITERIA)? no On Room Air or Needs oxygen at home (<6L)? is not on home oxygen therapy. Active safety concerns (ie Unable to use bedside commode independently and lacks caregiver support for safety- needs SNF placement, unable to obtain IV access)? no Has skin check been performed? no- pending  Has Physical Therapy screened the patient? not applicable  Common admission diagnoses including: CAP, COPD Exacerbation, Acute on chronic heart failure, Cellulitis, UTI , dehydration, acute resp failure with hypoxia  (requiring <5L)   Social Screening:  - Has the family been directly contacted about Hospital at Home program with consent obtained (if yes- please document who was spoken to with name and phone number)? yes  -Was the family approached about the use of TOC pharmacy for medications at discharge? no Denies significant ETOH intake? no Does not smoke and understands may not smoke in the presence of oxygen? not applicable Patient states able to use iPad/phone for communication/has family who is able to use? yes Patient has agreed to be compliant with medication and treatment regimen of the program? yes Any active drug use in patient or primary caregiver including daily dosing of methadone? no Stable home environment ( access to appropriate heating in cold conditions and/or appropriate air conditioning in hot conditions and/or no running water/electricity)? yes  No aggressive pets at home? no Firearm present? no  With ability or willingness  to store them unloaded in a locked case for duration of hospitalization? no Ambulatory? no  no difficulty Bed bugs present on home evaluation? no Family support system in place? yes Patient feels safe at home and does not endorse any violence? yes Any actively decompensated behavioral health issues including agitation/aggressive behavior? no  Patient requests food to be provided by hospital home program? no PT/OT eval completed and not requiring SNF, ALF, inpatient rehab? not applicable  To be admitted to the Hospital at [facility] program, a patient generally must meet the following: 1. Requirement for Inpatient Level of Care: The patient's condition must necessitate an inpatient level of care. This is typically indicated by one or more of the following, depending on their specific diagnosis:  Persistent tachycardia despite appropriate treatment (e.g., for Heart Failure, UTI). Persistent tachypnea (rapid breathing) or dyspnea (shortness of breath) that hasn't  improved sufficiently with observation care (e.g., for Heart Failure, Pneumonia, Viral Illness, COVID). Hypoxemia (low oxygen levels), such as a new need for oxygen, an increased need from baseline, or specific oxygen saturation levels (e.g., SpO2 <90-94% depending on the condition) that persist despite observation (e.g., for Heart Failure, COPD, Pneumonia, Viral Illness, COVID). Need for Intravenous (IV) hydration due to an inability to maintain oral hydration, which persists despite observation care (e.g., for Cellulitis, UTI, Viral Illness, COVID). Specific to Heart Failure: Persistent pulmonary edema, indicated by a new oxygen need, lack of improvement with IV diuretics, and ongoing tachypnea/dyspnea. Specific to COPD: A decrease in known baseline resting oxygen saturation (SpO2) by 4% or more, or an increase in pre-existing supplemental oxygen requirements, which persists despite observation and requires continued close monitoring. Specific to Pneumonia: A Pneumonia Severity Index (PSI) class IV (moderate risk). Specific to Cellulitis: Failure of outpatient antibiotic therapy (indicated by progression or no improvement after a minimum of 48 hours on an adequate regimen) or a clinical presentation (like acuity or rapidity of progression) that requires the intensity of monitoring found in an inpatient setting. Specific to UTI: Persistence or worsening of clinical findings like fever, pain, or dehydration despite observation care; presence of significant uropathy; suspected infection of an indwelling prosthetic device, stent, implant, or graft; or pregnancy with suspected pyelonephritis.  2. Appropriateness for Hospital at Home Setting: The patient's overall clinical picture, including the severity of their illness, their care needs, and their medical history and comorbidities, must be suitable for management in the Hospital at Home environment. This essentially means that none of the exclusion  criteria (listed below) are met.  Unified Exclusion Criteria for Hospital at Home Admission: A patient would not be eligible for Hospital at Home if any of the following are present: Hemodynamic Instability: Hypotension (low blood pressure) is present. Respiratory Instability or Needs Beyond Program Capability: There is a new need for invasive or noninvasive ventilatory assistance (like BiPAP or a ventilator). Oxygenation is not sufficient, generally indicated if an FiO2 (fraction of inspired oxygen) of 45% (which is about 6 Liters/minute via nasal cannula) or more is required to keep oxygen saturation (SpO2) at 90% or greater. Monitoring or Procedural Needs Beyond Program Capability: There is a need for invasive monitoring, such as a pulmonary artery catheter or an arterial line. There is a need for immediate-response telemetry monitoring (for dangerous arrhythmia detection and subsequent immediate intervention). The required medication regimen is beyond the capabilities of Hospital at Home (e.g., dosing intervals are too frequent for home administration). There is a need for a procedure that cannot be performed by the Hospital at  Home team (e.g., significant wound debridement or abscess drainage for cellulitis, or percutaneous nephrostomy for a complicated UTI). Significant Organ Dysfunction or Markers of Severe Illness: Mental status is not at baseline, or there is altered mental status suggestive of inadequate perfusion. Renal (kidney) function is unstable or showing an ongoing decline. There is evidence of inadequate perfusion, such as metabolic acidosis or myocardial ischemia. Uncompensated acidosis is present. Condition-Specific Severity or Complications Making Home Care Unsuitable: For Heart Failure: Known severe cardiac valvular disease (e.g., aortic stenosis, mitral regurgitation); or severe peripheral edema that impairs the ability to urinate or ambulate. For COPD: Known concurrent  comorbidity or finding that indicates a higher-risk COPD exacerbation (e.g., pulmonary fibrosis, cavitation, pleural effusion, pneumothorax, rib fracture). For Pneumonia: Pneumonia Severity Index (PSI) class V (indicating high risk for inpatient mortality); known concurrent comorbidity or finding that indicates higher-risk pneumonia (e.g., pulmonary fibrosis, cavitation, large or loculated pleural effusion); or a concomitant serious infectious process like endocarditis or empyema. For Cellulitis: Orbital, periorbital, or necrotizing infection is suspected; or a concomitant serious infectious process like endocarditis, septic emboli, or septic joint space infection. For UTI: Urinary tract obstruction (e.g., kidney stone, bladder outlet obstruction); or a concomitant serious infectious process like endocarditis or septic emboli. For Viral Illness & COVID-19: A concomitant serious infectious process like endocarditis or empyema.  General Comorbidities or Status:  The patient is significantly immunosuppressed (this applies to Pneumonia, Cellulitis, UTI, Viral Illness, and COVID-19). The patient meets inpatient admission criteria for a second diagnosis, or has care needs beyond the capabilities of Hospital at Home due to an active clinically significant comorbidity. (This is a general exclusion across all listed conditions)    Time spent: >60 minutes   [facility] 12/02/2024, 9:56 AM   "

## 2024-12-02 NOTE — Progress Notes (Signed)
 Video call completed with patient and medic who was present in the home. Introduced myself as the CHARITY FUNDRAISER for the night and verbalized that I will be available via tablet or phone call if any needs arise. Patient denies any pain at this time; only tenderness in her abdomen. States that her last BM was this morning and it was normal. Patient currently receiving scheduled IV antibiotic. All questions answered prior to ending call.

## 2024-12-03 NOTE — Progress Notes (Signed)
 The pt was seen today for her second hospital at home visit. The pt was alert and oriented x4. Her skin was warm,dry and appropriate in color. She had a strong radial pulse and was breathing normally. The pt's vitals were taken and are as noted. She denied any abdominal pain and stated that it was tender to the touch with minimal pain upon palpation.  The pt's IV abx were started but, it was soon noted her IV was no longer working. Same was D/C and a new Iv was placed as noted.  IV abx were continued through the new Iv without incident. The pt was assisted with her other nighttime medication and she stated that she would like to take her blood pressure medication at night since this is the routine she typically follows outside of the hospital. The virtual nurse was informed of same.  The pt stated she was having a headache ; she was given medication for same. Once the Iv abx were done the pt was asked if there was anything else that could be done for her and she denied same. She was told to call back if anything were to change.

## 2024-12-03 NOTE — Progress Notes (Signed)
 1924-Contact via phone-RN introduction, patient identifiers, overnight monitoring, medication administration plan and [facility] contact information reviewed. Patient denies pain, all questions answered. Patient requests to take scheduled medications upon Paramedic arrival.    2043--Outbound contact, patient informed of paramedic ETA.    2120-Video call completed with patient and paramedic. Patient alert and oriented sitting on the sofa.Patient medication self- administered /with Psychiatric Nurse per [facility] protocol for atorvastatin  (LIPITOR) tablet 40 mg.

## 2024-12-03 NOTE — Hospital Course (Addendum)
 Ms. Caitlin Bowers is a 73 y.o. female with history significant of diverticulosis, hyperlipidemia, hypertension, urinary urgency presenting with abdominal pain and bloating.    Patient reports generalized lower abdominal pains over the past 4 to 5 days.  No reported recent change in diet.  Reports remote history of diverticulitis flares in the past.  Most recently was roughly 2 years ago.   11/30/24: Patient hospitalist service for chief concerns of diverticulitis/acute colitis.  12/02/2024: Patient transferred to hospital at home service.  12/31: I assumed care of the patient.  Day 4 of antibiotics.  Patient reports her symptoms are improving and that she feels great.  She reports she slept very well last night.  12/04/24: day 5 of abx. Patient reports she slept well. She did report that she developed diarrhea yesterday evening, 2 watery bowel movements yesterday evening, 1 at 3 AM and 3 more watery bowel movements this morning prior to our team's arrival. Patient reports the bowel movements were watery and negative for blood.  She denies abdominal pain. She denies fever, chills.  She reports no other complaints. She denies changes to abdominal exam including worsening abdominal pain.  1/2: Day 6 of abx.  Patient had 2 episodes of watery diarrhea this a.m.  This is a decreased from yesterday's diarrhea.  Patient has no other complaints.  1/3: day 7 of abx. Patient will complete final dose of metronidazole  500 mg PO evening on 12/06/24. Patient discharge from [facility] today. Extensive diet counseling and nutrition follow-up has been provided for patient. Patient is discharged today.

## 2024-12-03 NOTE — Progress Notes (Signed)
 Patient reports having a good night. Denies abdominal pain, headache, and nausea/vomiting. Plan for paramedic visit between 1000-1100 today.

## 2024-12-03 NOTE — Plan of Care (Signed)
" °  Problem: Education: Goal: Knowledge of General Education information will improve Description: Including pain rating scale, medication(s)/side effects and non-pharmacologic comfort measures Outcome: Progressing   Problem: Health Behavior/Discharge Planning: Goal: Ability to manage health-related needs will improve Outcome: Progressing   Problem: Clinical Measurements: Goal: Ability to maintain clinical measurements within normal limits will improve Outcome: Progressing Goal: Cardiovascular complication will be avoided Outcome: Progressing   Problem: Nutrition: Goal: Adequate nutrition will be maintained Outcome: Progressing   Problem: Pain Managment: Goal: General experience of comfort will improve and/or be controlled Outcome: Progressing   Problem: Safety: Goal: Ability to remain free from injury will improve Outcome: Progressing   Problem: Skin Integrity: Goal: Risk for impaired skin integrity will decrease Outcome: Progressing   "

## 2024-12-03 NOTE — Progress Notes (Signed)
 Pt seen for routine [facility] evening visit. Pt appears well and greeted me at the door. Pt denies any complaints at this time but reports she did have a small amount of diarrhea earlier. Pt denies needing any medication for same at this time and wants to see how she feels tomorrow.  Vital signs and assessment obtained as noted. Lung sounds are clear in all fields. Pt reports dry cough that is ongoing. Heart tones are s1, s2 and pulses are strong and regular. Abdomen is soft and non-tender with active bowel sounds. Last bm was today as noted above.   Medications administered as noted. IV care completed including new curos cap. Coban removed due to it irritating her skin slightly.  Pt encouraged to call with any problems or issues overnight.

## 2024-12-03 NOTE — Plan of Care (Signed)
  Problem: Clinical Measurements: Goal: Will remain free from infection Outcome: Progressing   Problem: Nutrition: Goal: Adequate nutrition will be maintained Outcome: Progressing   Problem: Elimination: Goal: Will not experience complications related to bowel motility Outcome: Progressing Goal: Will not experience complications related to urinary retention Outcome: Progressing   Problem: Safety: Goal: Ability to remain free from injury will improve Outcome: Progressing   Problem: Skin Integrity: Goal: Risk for impaired skin integrity will decrease Outcome: Progressing

## 2024-12-03 NOTE — Plan of Care (Signed)

## 2024-12-03 NOTE — Progress Notes (Signed)
 Arrived to find the PT walking to the front door. No obvious distress or trauma noted. PT walks with a normal gait and denied any dizziness. PT is AOx4 and acting to her baseline. PT stated that she slept well last night and was feeling better. PT denied any ABD pain unless it is palpated.   Physical exam showed negative trauma to her head, neck, or face. PERRLA. Negative JVD or tracheal deviation. Chest expansion is equal with non-labored breathing. PT denied any SOB or difficulty breathing. ABD is soft, non-tender. Pain upon palpitation in bilateral lower quadrants. PT denied any urinary symptoms of polyuria, dysuria, or hematuria. Bowel sounds are present. LBM- 12/02/24. Negative pedal or peripheral edema.   PTs IV was occluded and was removed. PT was taken to [facility] ED and an ultrasound IV was placed. PT was brought back home and IV abx were initiated. All mediations were administered as prescribed.   PT denied having any other questions or concerns. PT was reminded that if she needed anything to call the virtual nurse.

## 2024-12-03 NOTE — Progress Notes (Addendum)
 " PROGRESS NOTE - Telemedicine  Caitlin Bowers  FMW:[id number] DOB: 07-Nov-1951 DOA: 11/30/2024 PCP: Ozell Heron HERO, MD   Ms. Caitlin Bowers is a 73 y.o. female with history significant of diverticulosis, hyperlipidemia, hypertension, urinary urgency presenting with abdominal pain and bloating.    Patient reports generalized lower abdominal pains over the past 4 to 5 days.  No reported recent change in diet.  Reports remote history of diverticulitis flares in the past.  Most recently was roughly 2 years ago.   11/30/24: Patient hospitalist service for chief concerns of diverticulitis/acute colitis.  12/02/2024: Patient transferred to hospital at home service.  12/31: I assumed care of the patient.  Day 4 of antibiotics.  Patient reports her symptoms are improving and that she feels great.  She reports she slept very well last night.  Assessment & Plan:   Principal Problem:   Diverticulitis Active Problems:   Hyperlipidemia   Hypertension   Diverticulosis of sigmoid colon   Hyponatremia   Assessment and Plan:  * Diverticulitis 4-5 days of progressive lower abdominal pain, decreased po intake and nausea on presentation  Noted prior history of similar flares assd w/ diverticulitis- last flare 2 years ago  CT imaging showing severe sigmoid colon thickening concerning for diverticulitis vs. Colitis 11/30/24 Started on Ceftriaxone  2 g IV daily and metronidazole  500 mg IV q12 IV Advanced to full liquid diet-tolerating well  Prn percocet for pain 12/31: Day 4 of abx. Given history of recurrent diverticulitis and abscess, we will treat with IV abx for 7 days to ensure resolution. I discussed that patient needs to follow closely with a GI specialist on discharge. Patient agrees and states she will talk to her PCP for referral. Pt has appt with PCP in January 2026.   Hyponatremia Resolved  Hypertension Continue home lisinopril  20 mg daily, atenolol  12.5 mg  daily  Hyperlipidemia Continue atorvastatin  40 mg daily  DVT prophylaxis: ambulation as tolerated  Code Status: full code Family Communication: No Disposition Plan: Pending clinical course, anticipate discharge on Saturday, 12/07/23. Level of care: Hospital at Home Med-Surg  Consultants:  none  Procedures:  None  Antimicrobials: Day 4 of antibiotic: Ceftriaxone  2 g IV daily, metronidazole  500 mg IV twice daily  Subjective:  At bedside, via telemedicine video encounter, patient was able to tell me her first and last name, age, current calendar year. She reports occasional cramping spasms and then it goes away. She denies any blood in her stool and reports she has had good bowel movements since. She denies fever, shortness of breath, nausea, vomiting. She reports she slept very well last night.  Objective: Vitals:   12/02/24 1408 12/02/24 2111 12/03/24 0922 12/03/24 1302  BP: (!) (P) 158/88 (!) 170/80 (!) 152/88 (!) 184/99  Pulse: (P) 67 65 66 73  Resp: (P) 18 16  16   Temp: (P) 98.1 F (36.7 C) 98 F (36.7 C)  97.6 F (36.4 C)  TempSrc: (P) Oral Oral  Oral  SpO2: (P) 98% 94%  98%  Weight:   75.2 kg 75.8 kg  Height:    5' 10 (1.778 m)   No intake or output data in the 24 hours ending 12/03/24 1406 Filed Weights   11/30/24 1440 12/03/24 0922 12/03/24 1302  Weight: 77 kg 75.2 kg 75.8 kg   Examination was completed with the assistance of: Richerd Batty, RN, who was present in the house during the virtual encounter:  General exam: Appears calm and comfortable  Respiratory system: Clear  to auscultation. Respiratory effort normal. Cardiovascular system: S1 & S2 heard, RRR. No murmurs. No pedal edema. Gastrointestinal system: Abdomen is nondistended, soft and nontender. No organomegaly or masses felt. Normal bowel sounds heard. Central nervous system: Alert and oriented. No focal neurological deficits. Extremities: Symmetric 5 x 5 power. Skin: No rashes, lesions or  ulcers Psychiatry: Judgement and insight appear normal. Mood & affect appropriate.   Data Reviewed: I have personally reviewed following labs and imaging studies  CBC: Recent Labs  Lab 11/30/24 0953 12/01/24 0506 12/02/24 0503  WBC 14.2* 7.5 5.4  HGB 12.4 10.5* 11.0*  HCT 36.5 31.8* 32.9*  MCV 93.1 95.5 93.2  PLT 231 179 185   Basic Metabolic Panel: Recent Labs  Lab 11/30/24 0953 11/30/24 1502 12/01/24 0506 12/02/24 0503  NA 129* 136 137 138  K 4.3 3.7 3.7 4.0  CL 96* 100 103 105  CO2 22 24 23  21*  GLUCOSE 119* 102* 93 95  BUN 17 14 11 10   CREATININE 1.08* 1.01* 0.99 0.86  CALCIUM  10.1 9.4 9.0 9.0   GFR: Estimated Creatinine Clearance: 61.1 mL/min (by C-G formula based on SCr of 0.86 mg/dL).  Liver Function Tests: Recent Labs  Lab 11/30/24 0953 12/01/24 0506  AST 22 14*  ALT 16 11  ALKPHOS 106 86  BILITOT 1.5* 0.9  PROT 7.3 6.1*  ALBUMIN 4.4 3.6   Recent Labs  Lab 11/30/24 0953  LIPASE 33   Sepsis Labs: Recent Labs  Lab 11/30/24 1307  LATICACIDVEN 1.0   Scheduled Meds:  atenolol   12.5 mg Oral Daily   atorvastatin   40 mg Oral Daily   fluticasone   1 spray Each Nare BID   Lifitegrast   1 drop Both Eyes QPM   lisinopril   20 mg Oral Daily   mirabegron  ER  50 mg Oral Daily   Continuous Infusions:  cefTRIAXone  (ROCEPHIN )  IV 2 g (12/03/24 1038)   metronidazole  Stopped (12/03/24 1300)    LOS: 2 days   Time spent: 50 minutes  Dr. Sherre Location: [place]  [facility] If 7PM-7AM, please contact night-coverage 12/03/2024, 2:06 PM  "

## 2024-12-04 DIAGNOSIS — R197 Diarrhea, unspecified: Secondary | ICD-10-CM

## 2024-12-04 LAB — BASIC METABOLIC PANEL WITH GFR
Anion gap: 12 (ref 5–15)
BUN: 8 mg/dL (ref 8–23)
CO2: 22 mmol/L (ref 22–32)
Calcium: 9.1 mg/dL (ref 8.9–10.3)
Chloride: 103 mmol/L (ref 98–111)
Creatinine, Ser: 0.89 mg/dL (ref 0.44–1.00)
GFR, Estimated: >60 mL/min
Glucose, Bld: 102 mg/dL — ABNORMAL HIGH (ref 70–99)
Potassium: 4.3 mmol/L (ref 3.5–5.1)
Sodium: 136 mmol/L (ref 135–145)

## 2024-12-04 LAB — CBC
HCT: 34 % — ABNORMAL LOW (ref 36.0–46.0)
Hemoglobin: 11.6 g/dL — ABNORMAL LOW (ref 12.0–15.0)
MCH: 31.4 pg (ref 26.0–34.0)
MCHC: 34.1 g/dL (ref 30.0–36.0)
MCV: 92.1 fL (ref 80.0–100.0)
Platelets: 251 K/uL (ref 150–400)
RBC: 3.69 MIL/uL — ABNORMAL LOW (ref 3.87–5.11)
RDW: 12.6 % (ref 11.5–15.5)
WBC: 4.7 K/uL (ref 4.0–10.5)
nRBC: 0 % (ref 0.0–0.2)

## 2024-12-04 NOTE — Progress Notes (Signed)
 Called to check on the patient. No reports of pain or discomfort. Denies diarrhea.

## 2024-12-04 NOTE — TOC Progression Note (Signed)
 Transition of Care [facility]) - Progression Note    Patient Details  Name: Caitlin Bowers MRN: [id number] Date of Birth: October 30, 1951  Transition of Care [facility]) CM/SW Contact  Debarah Saunas, RN Phone Number: 12/04/2024, 8:02 AM  Clinical Narrative:     RNCM following for discharge needs. Dov Dill J. Debarah, BSN,  RN, [facility] [phone]    Barriers to Discharge: Continued Medical Work up               Expected Discharge Plan and Services   Discharge Planning Services: CM Consult                                           Social Drivers of Health (SDOH) Interventions SDOH Screenings   Food Insecurity: No Food Insecurity (11/30/2024)  Housing: Low Risk (11/30/2024)  Transportation Needs: No Transportation Needs (11/30/2024)  Utilities: Not At Risk (11/30/2024)  Alcohol Screen: Low Risk (10/07/2024)  Depression (PHQ2-9): Low Risk (07/01/2024)  Financial Resource Strain: Low Risk (10/07/2024)  Physical Activity: Sufficiently Active (10/07/2024)  Social Connections: Moderately Isolated (11/30/2024)  Stress: No Stress Concern Present (10/07/2024)  Tobacco Use: Low Risk (11/30/2024)  Health Literacy: Adequate Health Literacy (01/02/2024)    Readmission Risk Interventions    12/04/2024    7:56 AM  Readmission Risk Prevention Plan  Post Dischage Appt Complete  Medication Screening Complete  Transportation Screening Complete

## 2024-12-04 NOTE — Assessment & Plan Note (Addendum)
 Secondary to IV antibiotics  CBC was negative for WBC elevation, low clinical suspicion for infectious diarrhea/c diff colitis at this time Discussed with patient to continue to monitor Discussed extensively for patient to start soft diet with yogurt with active cultures, eat kim chi, and sauerkraut that are in the refrigerated section of grocery stores Patient to let RN and/or team know if patient has more than 3 more water bowel movements the rest of the day

## 2024-12-04 NOTE — Progress Notes (Addendum)
 " PROGRESS NOTE - Telemedicine  Caitlin Bowers  FMW:[id number] DOB: Jul 31, 1951 DOA: 11/30/2024 PCP: Ozell Heron HERO, MD   Caitlin Bowers is a 74 y.o. female with history significant of diverticulosis, hyperlipidemia, hypertension, urinary urgency presenting with abdominal pain and bloating.    Patient reports generalized lower abdominal pains over the past 4 to 5 days.  No reported recent change in diet.  Reports remote history of diverticulitis flares in the past.  Most recently was roughly 2 years ago.   11/30/24: Patient hospitalist service for chief concerns of diverticulitis/acute colitis.  12/02/2024: Patient transferred to hospital at home service.  12/31: I assumed care of the patient.  Day 4 of antibiotics.  Patient reports her symptoms are improving and that she feels great.  She reports she slept very well last night.  12/04/24: day 5 of abx. Patient reports she slept well. She did report that she developed diarrhea yesterday evening, 2 watery bowel movements yesterday evening, 1 at 3 AM and 3 more watery bowel movements this morning prior to our team's arrival. Patient reports the bowel movements were watery and negative for blood.  She denies abdominal pain. She denies fever, chills.  She reports no other complaints. She denies changes to abdominal exam including worsening abdominal pain.  Assessment & Plan:   Principal Problem:   Diverticulitis Active Problems:   Diarrhea   Hyperlipidemia   Hypertension   Diverticulosis of sigmoid colon   Hyponatremia   Assessment and Plan:  * Diverticulitis 4-5 days of progressive lower abdominal pain, decreased po intake and nausea on presentation  Noted prior history of similar flares assd w/ diverticulitis- last flare 2 years ago  CT imaging showing severe sigmoid colon thickening concerning for diverticulitis vs. Colitis 11/30/24 Started on Ceftriaxone  2 g IV daily and metronidazole  500 mg IV q12 IV Advanced to  full liquid diet-tolerating well  Prn percocet for pain 12/31: Day 4 of abx. Given history of recurrent diverticulitis and abscess, we will treat with IV abx for 7 days to ensure resolution. I discussed that patient needs to follow closely with a GI specialist on discharge. Patient agrees and states she will talk to her PCP for referral. Pt has appt with PCP in January 2026.   12/04/24: advance to soft diet and include fermented refrigerated foods such as kim chi, sauerkraut, and different yogurt variety with active culture, plain flavor recommended  Diarrhea Secondary to IV antibiotics  CBC was negative for WBC elevation, low clinical suspicion for infectious diarrhea/c diff colitis at this time Discussed with patient to continue to monitor Discussed extensively for patient to start soft diet with yogurt with active cultures, eat kim chi, and sauerkraut that are in the refrigerated section of grocery stores Patient to let RN and/or team know if patient has more than 3 more water bowel movements the rest of the day  Hyponatremia Resolved  Hypertension Continue home lisinopril  20 mg daily, atenolol  12.5 mg daily  Hyperlipidemia Continue atorvastatin  40 mg daily  DVT prophylaxis: ambulation as tolerated Code Status: full code Family Communication: family not at side Disposition Plan: Anticipate discharge in 12/06/24 Level of care: Hospital at Home Med-Surg  Consultants:  None  Procedures:  None  Antimicrobials: Day 5 of antibiotic  Subjective:  At bedside, via video telemedicine encounter, patient was able to confirm her first and last name, and her date of birth.  She did not appear to be in acute distress.  She appears rested and comfortable.  Patient reports diarrhea 6 episodes total since our virtual encounter yesterday.  She reports they are watery and negative for blood.  She denies associated abdominal pain, fever, chills, shortness of breath,  diaphoresis.  Objective: Vitals:   12/03/24 1932 12/03/24 2100 12/04/24 0359 12/04/24 0930  BP:  117/69  (!) 145/88  Pulse:  68 (!) 56 81  Resp:  16 17 16   Temp:  98.3 F (36.8 C)  98.3 F (36.8 C)  TempSrc:  Oral    SpO2: 98% 95% 95% 94%  Weight:      Height:        Intake/Output Summary (Last 24 hours) at 12/04/2024 1432 Last data filed at 12/04/2024 1048 Gross per 24 hour  Intake 1403.83 ml  Output --  Net 1403.83 ml   Filed Weights   11/30/24 1440 12/03/24 0922 12/03/24 1302  Weight: 77 kg 75.2 kg 75.8 kg   Examination was completed with the assistance of: [employer], paramedic, who was present in the house during the virtual encounter:  General exam: Appears calm and comfortable  Respiratory system: Clear to auscultation. Respiratory effort normal. Cardiovascular system: S1 & S2 heard, RRR. No murmurs. Gastrointestinal system: Abdomen is nondistended, soft and nontender. Normal bowel sounds heard. Central nervous system: Alert and oriented. No focal neurological deficits. Extremities: Symmetric 5 x 5 power. Skin: No rashes, lesions or ulcers Psychiatry: Judgement and insight appear normal. Mood & affect appropriate.   Data Reviewed: I have personally reviewed following labs and imaging studies  CBC: Recent Labs  Lab 11/30/24 0953 12/01/24 0506 12/02/24 0503 12/04/24 1048  WBC 14.2* 7.5 5.4 4.7  HGB 12.4 10.5* 11.0* 11.6*  HCT 36.5 31.8* 32.9* 34.0*  MCV 93.1 95.5 93.2 92.1  PLT 231 179 185 251   Basic Metabolic Panel: Recent Labs  Lab 11/30/24 0953 11/30/24 1502 12/01/24 0506 12/02/24 0503 12/04/24 1048  NA 129* 136 137 138 136  K 4.3 3.7 3.7 4.0 4.3  CL 96* 100 103 105 103  CO2 22 24 23  21* 22  GLUCOSE 119* 102* 93 95 102*  BUN 17 14 11 10 8   CREATININE 1.08* 1.01* 0.99 0.86 0.89  CALCIUM  10.1 9.4 9.0 9.0 9.1   GFR: Estimated Creatinine Clearance: 59.1 mL/min (by C-G formula based on SCr of 0.89 mg/dL).  Liver Function Tests: Recent  Labs  Lab 11/30/24 0953 12/01/24 0506  AST 22 14*  ALT 16 11  ALKPHOS 106 86  BILITOT 1.5* 0.9  PROT 7.3 6.1*  ALBUMIN 4.4 3.6   Recent Labs  Lab 11/30/24 0953  LIPASE 33   Sepsis Labs: Recent Labs  Lab 11/30/24 1307  LATICACIDVEN 1.0   Scheduled Meds:  atenolol   12.5 mg Oral Daily   atorvastatin   40 mg Oral Daily   fluticasone   1 spray Each Nare BID   Lifitegrast   1 drop Both Eyes QPM   lisinopril   20 mg Oral Daily   mirabegron  ER  50 mg Oral Daily   Continuous Infusions:  cefTRIAXone  (ROCEPHIN )  IV Stopped (12/04/24 0955)   metronidazole  Stopped (12/04/24 1018)    LOS: 3 days   Time spent: 52 minutes  Dr. Sherre Location: [place]  [facility] If 7PM-7AM, please contact night-coverage 12/04/2024, 2:32 PM  "

## 2024-12-04 NOTE — Progress Notes (Signed)
 Called patient. Made patient aware that I will be the RN for the evening and that medic Delon was en route to her home to administer her evening medications. Patient verbalizes understanding, no further needs at this time.

## 2024-12-04 NOTE — Plan of Care (Signed)
  Problem: Education: Goal: Knowledge of General Education information will improve Description: Including pain rating scale, medication(s)/side effects and non-pharmacologic comfort measures Outcome: Progressing   Problem: Health Behavior/Discharge Planning: Goal: Ability to manage health-related needs will improve Outcome: Progressing   Problem: Nutrition: Goal: Adequate nutrition will be maintained Outcome: Progressing   Problem: Pain Managment: Goal: General experience of comfort will improve and/or be controlled Outcome: Progressing   Problem: Safety: Goal: Ability to remain free from injury will improve Outcome: Progressing

## 2024-12-04 NOTE — Progress Notes (Signed)
 Video call completed with patient. Patient receiving her IV antibiotic at time of call. Patient with no complaints of abdominal pain and no more episodes of diarrhea at this time. She states she was able to eat cottage cheese, gumbo broth, and rice today. Patient encouraged to call RN anytime overnight if needs arise. All medications given with assist from medic.

## 2024-12-04 NOTE — Progress Notes (Incomplete)
 Arrived to patient  she is alert and oriented   she stats still some loose stools but not as bad as it has been  all vitals are stable  see assessment flow sheet for further   lipitor was still scheduled for this morning and given but she said she normally takes it at night   she was advised that she wouldn't have it tonight   only her antibiotic would be due  Dr Sherre said that if blood work couldn't be drawn that it would be fine to not take her to the hospital for a draw    she said that her blood work was fine the other day and not to worry about it if it can't be drawn  patient was advised to call if anything is needed before her next visit this evening

## 2024-12-04 NOTE — Plan of Care (Signed)
" °  Problem: Education: Goal: Knowledge of General Education information will improve Description: Including pain rating scale, medication(s)/side effects and non-pharmacologic comfort measures 12/04/2024 1114 by Lorijean Husser, Shanda GAILS, RN Outcome: Progressing 12/04/2024 1114 by Kien Mirsky, Shanda GAILS, RN Outcome: Progressing   Problem: Health Behavior/Discharge Planning: Goal: Ability to manage health-related needs will improve 12/04/2024 1114 by Dezeray Puccio, Shanda GAILS, RN Outcome: Progressing 12/04/2024 1114 by Mina Babula, Shanda GAILS, RN Outcome: Progressing   Problem: Clinical Measurements: Goal: Ability to maintain clinical measurements within normal limits will improve 12/04/2024 1114 by Sary Bogie, Shanda GAILS, RN Outcome: Progressing 12/04/2024 1114 by Heaven Wandell, Shanda GAILS, RN Outcome: Progressing Goal: Will remain free from infection 12/04/2024 1114 by Domenique Southers, Shanda GAILS, RN Outcome: Progressing 12/04/2024 1114 by Whitley Patchen, Shanda GAILS, RN Outcome: Progressing Goal: Diagnostic test results will improve 12/04/2024 1114 by Teyla Skidgel, Shanda GAILS, RN Outcome: Progressing 12/04/2024 1114 by Catalia Massett, Shanda GAILS, RN Outcome: Progressing Goal: Respiratory complications will improve 12/04/2024 1114 by Oseas Detty, Shanda GAILS, RN Outcome: Progressing 12/04/2024 1114 by Falisa Lamora, Shanda GAILS, RN Outcome: Progressing Goal: Cardiovascular complication will be avoided 12/04/2024 1114 by Alliene Klugh, Shanda GAILS, RN Outcome: Progressing 12/04/2024 1114 by Tawn Fitzner, Shanda GAILS, RN Outcome: Progressing   Problem: Activity: Goal: Risk for activity intolerance will decrease 12/04/2024 1114 by Iver Miklas, Shanda GAILS, RN Outcome: Progressing 12/04/2024 1114 by Jaana Brodt, Shanda GAILS, RN Outcome: Progressing   Problem: Nutrition: Goal: Adequate nutrition will be maintained 12/04/2024 1114 by Mathhew Buysse, Shanda GAILS, RN Outcome: Progressing 12/04/2024 1114 by Seaira Byus, Shanda GAILS, RN Outcome: Progressing   Problem: Coping: Goal: Level of anxiety will decrease 12/04/2024  1114 by Rozlynn Lippold, Shanda GAILS, RN Outcome: Progressing 12/04/2024 1114 by Jamaal Bernasconi, Shanda GAILS, RN Outcome: Progressing   Problem: Elimination: Goal: Will not experience complications related to bowel motility 12/04/2024 1114 by Myles Mallicoat, Shanda GAILS, RN Outcome: Progressing 12/04/2024 1114 by Rosealee Recinos, Shanda GAILS, RN Outcome: Progressing Goal: Will not experience complications related to urinary retention 12/04/2024 1114 by Jiya Kissinger, Shanda GAILS, RN Outcome: Progressing 12/04/2024 1114 by Melitza Metheny, Shanda GAILS, RN Outcome: Progressing   Problem: Pain Managment: Goal: General experience of comfort will improve and/or be controlled 12/04/2024 1114 by Aubrii Sharpless, Shanda GAILS, RN Outcome: Progressing 12/04/2024 1114 by Rue Valladares, Shanda GAILS, RN Outcome: Progressing   Problem: Safety: Goal: Ability to remain free from injury will improve 12/04/2024 1114 by Jeweline Reif, Shanda GAILS, RN Outcome: Progressing 12/04/2024 1114 by Miriam Kestler, Shanda GAILS, RN Outcome: Progressing   Problem: Skin Integrity: Goal: Risk for impaired skin integrity will decrease 12/04/2024 1114 by Tomara Youngberg, Shanda GAILS, RN Outcome: Progressing 12/04/2024 1114 by Maryan Sivak, Shanda GAILS, RN Outcome: Progressing   "

## 2024-12-05 MED ORDER — METRONIDAZOLE 500 MG PO TABS
500.0000 mg | ORAL_TABLET | Freq: Two times a day (BID) | ORAL | Status: DC
  Administered 2024-12-05 – 2024-12-06 (×3): 500 mg via ORAL
  Filled 2024-12-05 (×4): qty 1

## 2024-12-05 NOTE — Progress Notes (Signed)
 The pt was seen her nightly hospital at home visit. The pt was alert and oriented x4. Her skin was warm,dry and normal on color . She had a strong radial pulse and was breathing normally. The pt states she had a good day today and was able to eat some soft solid foods without and trouble. The pt denied any pain at this time. Vitals were taken and are as noted. The pt was given her night time IV ABX without any incident and assisted in her other medications. Once IV abx were done the pt was asked fi there was anything else that could be done for her and she denied same. The pt was told to call back if anything were to change.

## 2024-12-05 NOTE — Plan of Care (Signed)
  Problem: Education: Goal: Knowledge of General Education information will improve Description: Including pain rating scale, medication(s)/side effects and non-pharmacologic comfort measures Outcome: Progressing   Problem: Health Behavior/Discharge Planning: Goal: Ability to manage health-related needs will improve Outcome: Progressing   Problem: Clinical Measurements: Goal: Ability to maintain clinical measurements within normal limits will improve Outcome: Progressing   Problem: Nutrition: Goal: Adequate nutrition will be maintained Outcome: Progressing   Problem: Pain Managment: Goal: General experience of comfort will improve and/or be controlled Outcome: Progressing   Problem: Safety: Goal: Ability to remain free from injury will improve Outcome: Progressing

## 2024-12-05 NOTE — Progress Notes (Addendum)
 " PROGRESS NOTE - Telemedicine  Caitlin Bowers  FMW:[id number] DOB: [birth year] DOA: 11/30/2024 PCP: Ozell Heron HERO, MD   Caitlin Bowers is a 74 y.o. female with history significant of diverticulosis, hyperlipidemia, hypertension, urinary urgency presenting with abdominal pain and bloating.    Patient reports generalized lower abdominal pains over the past 4 to 5 days.  No reported recent change in diet.  Reports remote history of diverticulitis flares in the past.  Most recently was roughly 2 years ago.   11/30/24: Patient hospitalist service for chief concerns of diverticulitis/acute colitis.  12/02/2024: Patient transferred to hospital at home service.  12/31: I assumed care of the patient.  Day 4 of antibiotics.  Patient reports her symptoms are improving and that she feels great.  She reports she slept very well last night.  12/04/24: day 5 of abx. Patient reports she slept well. She did report that she developed diarrhea yesterday evening, 2 watery bowel movements yesterday evening, 1 at 3 AM and 3 more watery bowel movements this morning prior to our team's arrival. Patient reports the bowel movements were watery and negative for blood.  She denies abdominal pain. She denies fever, chills.  She reports no other complaints. She denies changes to abdominal exam including worsening abdominal pain.  1/2: Day 6 of abx.  Patient had 2 episodes of watery diarrhea this a.m.  This is a decreased from yesterday's diarrhea.  Patient has no other complaints.  Assessment & Plan:   Principal Problem:   Diverticulitis Active Problems:   Diarrhea   Hyperlipidemia   Hypertension   Diverticulosis of sigmoid colon   Hyponatremia   Assessment and Plan:  * Diverticulitis 4-5 days of progressive lower abdominal pain, decreased po intake and nausea on presentation  Noted prior history of similar flares assd w/ diverticulitis- last flare 2 years ago  CT imaging showing severe  sigmoid colon thickening concerning for diverticulitis vs. Colitis 11/30/24 Started on Ceftriaxone  2 g IV daily and metronidazole  500 mg IV q12 IV Advanced to full liquid diet-tolerating well  Prn percocet for pain 12/31: Day 4 of abx. Given history of recurrent diverticulitis and abscess, we will treat with IV abx for 7 days to ensure resolution. I discussed that patient needs to follow closely with a GI specialist on discharge. Patient agrees and states she will talk to her PCP for referral. Pt has appt with PCP in January 2026.   12/04/24: advance to soft diet and include fermented refrigerated foods such as kim chi, sauerkraut, and different yogurt variety with active culture, plain flavor recommended  Diarrhea Secondary to IV antibiotics  CBC was negative for WBC elevation, low clinical suspicion for infectious diarrhea/c diff colitis at this time Discussed with patient to continue to monitor Discussed extensively for patient to start soft diet with yogurt with active cultures, eat kim chi, and sauerkraut that are in the refrigerated section of grocery stores Patient to let RN and/or team know if patient has more than 3 more water bowel movements the rest of the day  Hyponatremia Resolved  Hypertension Continue home lisinopril  20 mg daily, atenolol  12.5 mg daily  Hyperlipidemia Continue atorvastatin  40 mg daily    DVT prophylaxis: ambulation as tolerated Code Status: Full code Family Communication: Family not at bedside Disposition Plan: Anticipate discharge in a.m. 1/3 Level of care: Hospital at Home Med-Surg  Consultants:  PT, OT, pharmacy  Antimicrobials: 1/2: Day 6 of antibiotic  Subjective:  At bedside, via video telemedicine encounter,  patient was able to tell me her first last name, date of birth.  She reports she slept very well last night.  She has no complaints.  She reports the diarrhea got better.  She only had 2 episodes of watery diarrhea this morning.  She  reports she ate scrambled eggs with sauerkraut that was fresh and has cultures.  She also ate yogurt yesterday.  Her husband made gumbo and she had a little bit about and it was delicious.  Objective: Vitals:   12/04/24 2021 12/05/24 0933 12/05/24 0940 12/05/24 1504  BP: 138/87 (!) 162/90 (!) 150/82 (!) 144/64  Pulse: 67 71  68  Resp: 18 16  16   Temp: 98.2 F (36.8 C)   97.9 F (36.6 C)  TempSrc: Oral   Oral  SpO2: 97% 98%  96%  Weight:      Height:        Intake/Output Summary (Last 24 hours) at 12/05/2024 1843 Last data filed at 12/04/2024 2117 Gross per 24 hour  Intake 100 ml  Output --  Net 100 ml   Filed Weights   11/30/24 1440 12/03/24 0922 12/03/24 1302  Weight: 77 kg 75.2 kg 75.8 kg   Examination was completed with the assistance of: Tyrell Lewis, paramedic, who was present in the house during the virtual encounter:  General exam: Appears calm and comfortable  Respiratory system: Clear to auscultation. Respiratory effort normal. Cardiovascular system: S1 & S2 heard, RRR. No murmurs. No pedal edema. Gastrointestinal system: Abdomen is nondistended, soft and nontender.  Normal bowel sounds heard. Central nervous system: Alert and oriented. No focal neurological deficits. Extremities: Symmetric 5 x 5 power. Skin: No rashes, lesions or ulcers Psychiatry: Judgement and insight appear normal. Mood & affect appropriate.   Data Reviewed: I have personally reviewed following labs and imaging studies  CBC: Recent Labs  Lab 11/30/24 0953 12/01/24 0506 12/02/24 0503 12/04/24 1048  WBC 14.2* 7.5 5.4 4.7  HGB 12.4 10.5* 11.0* 11.6*  HCT 36.5 31.8* 32.9* 34.0*  MCV 93.1 95.5 93.2 92.1  PLT 231 179 185 251   Basic Metabolic Panel: Recent Labs  Lab 11/30/24 0953 11/30/24 1502 12/01/24 0506 12/02/24 0503 12/04/24 1048  NA 129* 136 137 138 136  K 4.3 3.7 3.7 4.0 4.3  CL 96* 100 103 105 103  CO2 22 24 23  21* 22  GLUCOSE 119* 102* 93 95 102*  BUN 17 14 11 10 8    CREATININE 1.08* 1.01* 0.99 0.86 0.89  CALCIUM  10.1 9.4 9.0 9.0 9.1   GFR: Estimated Creatinine Clearance: 59.1 mL/min (by C-G formula based on SCr of 0.89 mg/dL). Liver Function Tests: Recent Labs  Lab 11/30/24 0953 12/01/24 0506  AST 22 14*  ALT 16 11  ALKPHOS 106 86  BILITOT 1.5* 0.9  PROT 7.3 6.1*  ALBUMIN 4.4 3.6   Recent Labs  Lab 11/30/24 0953  LIPASE 33   Sepsis Labs: Recent Labs  Lab 11/30/24 1307  LATICACIDVEN 1.0   Scheduled Meds:  atenolol   12.5 mg Oral Daily   atorvastatin   40 mg Oral Daily   fluticasone   1 spray Each Nare BID   Lifitegrast   1 drop Both Eyes QPM   lisinopril   20 mg Oral Daily   metroNIDAZOLE   500 mg Oral BID   mirabegron  ER  50 mg Oral Daily   Continuous Infusions:  cefTRIAXone  (ROCEPHIN )  IV Stopped (12/05/24 1007)    LOS: 4 days   Time spent: 52 mins  Dr. Sherre Location: [place]  [place] [facility] If 7PM-7AM, please contact night-coverage 12/05/2024, 6:43 PM  "

## 2024-12-05 NOTE — Progress Notes (Signed)
 H@H  medic out to see patient for second visit of the day. On arrival I was welcomed in by the patient at the door. See flowsheet for assessment. No clinical changes from this mornings visit. The IV that was placed in her (R) upper arm was leaking from the extension loop attached to the catheter. On inspection there was a kink and small cut in the line. Pt requested IV to be removed at this time after I discussed options with her for new placement. Pt refused new placement today and would like a new one to be placed in the morning before her infusion. I agreed to come out to see the patient tomorrow for IV placement and visit. No other tasks were needed for this visit. Pt was left in care of herself. H@H  visit complete.

## 2024-12-05 NOTE — Progress Notes (Signed)
 Completed virtual rounds with MD,paramedic at patient bedside. POC reviewed and discussed ,patient voices understanding and agreement. Pt reminded to call RN for any needs, RN and MD available at all times. Pt voices understanding. Pt aware of next planned visit and next call from RN.

## 2024-12-05 NOTE — Progress Notes (Signed)
 H@H  medic out to see the patient this morning. On arrival, I was met by the patient at the door who welcomed me in. See flowsheet for assessment. Abx were started and completed. Meds were given with RN oversight. A video visit with the provider and RN was conducted and completed. No labs were indicated for today. Pts IV was leaking from the J loop. I discussed changing the whole IV later today when I go out for her second visit. Education was provided on soft food diet plans. No other tasks were needed at this time. The patient was left in care of herself and her husband. H@H  visit complete.

## 2024-12-06 ENCOUNTER — Other Ambulatory Visit (HOSPITAL_COMMUNITY): Payer: Self-pay

## 2024-12-06 MED ORDER — METRONIDAZOLE 500 MG PO TABS
500.0000 mg | ORAL_TABLET | Freq: Two times a day (BID) | ORAL | 0 refills | Status: AC
  Filled 2024-12-06: qty 1, 1d supply, fill #0

## 2024-12-06 MED ORDER — ONDANSETRON HCL 4 MG PO TABS
4.0000 mg | ORAL_TABLET | Freq: Four times a day (QID) | ORAL | 0 refills | Status: DC | PRN
  Filled 2024-12-06: qty 20, 5d supply, fill #0

## 2024-12-06 NOTE — Discharge Summary (Addendum)
 " Physician Discharge Summary   Patient: Caitlin Bowers MRN: [id number] DOB: 06-Jun-1951  Admit date:     11/30/2024  Discharge date: 12/06/2024  Discharge Physician: Dr. Sherre   At bedside, via telemedicine video encounter, patient was able to confirm her first and last name, date of birth.  PCP: Ozell Heron HERO, MD   Recommendations at discharge:   Take your last dose of antibiotic: flagyl  500 mg PO evening of 12/06/24.  Follow-up with your pcp within 4 weeks of discharge. Ask your PCP to send referral for GI specialist to establish ongoing care. Try to advance diet and activity gradually. Eat different types of food that contain active cultures from different brands to help re-colonize healthy gut flora (sauerkraut, kim chi, yogurt).  Discharge Diagnoses: Principal Problem:   Diverticulitis Active Problems:   Diarrhea   Hyperlipidemia   Hypertension   Diverticulosis of sigmoid colon   Hyponatremia  Resolved Problems:   * No resolved hospital problems. [facility] Course: Caitlin Bowers is a 74 y.o. female with history significant of diverticulosis, hyperlipidemia, hypertension, urinary urgency presenting with abdominal pain and bloating.    Patient reports generalized lower abdominal pains over the past 4 to 5 days.  No reported recent change in diet.  Reports remote history of diverticulitis flares in the past.  Most recently was roughly 2 years ago.   11/30/24: Patient hospitalist service for chief concerns of diverticulitis/acute colitis.  12/02/2024: Patient transferred to hospital at home service.  12/31: I assumed care of the patient.  Day 4 of antibiotics.  Patient reports her symptoms are improving and that she feels great.  She reports she slept very well last night.  12/04/24: day 5 of abx. Patient reports she slept well. She did report that she developed diarrhea yesterday evening, 2 watery bowel movements yesterday evening, 1 at 3 AM and 3 more  watery bowel movements this morning prior to our team's arrival. Patient reports the bowel movements were watery and negative for blood.  She denies abdominal pain. She denies fever, chills.  She reports no other complaints. She denies changes to abdominal exam including worsening abdominal pain.  1/2: Day 6 of abx.  Patient had 2 episodes of watery diarrhea this a.m.  This is a decreased from yesterday's diarrhea.  Patient has no other complaints.  1/3: day 7 of abx. Patient will complete final dose of metronidazole  500 mg PO evening on 12/06/24. Patient discharge from [facility] today. Extensive diet counseling and nutrition follow-up has been provided for patient. Patient is discharged today.  Assessment and Plan:  * Diverticulitis 4-5 days of progressive lower abdominal pain, decreased po intake and nausea on presentation  Noted prior history of similar flares assd w/ diverticulitis- last flare 2 years ago  CT imaging showing severe sigmoid colon thickening concerning for diverticulitis vs. Colitis 11/30/24 Started on Ceftriaxone  2 g IV daily and metronidazole  500 mg IV q12 IV Advanced to full liquid diet-tolerating well  Prn percocet for pain 12/31: Day 4 of abx. Given history of recurrent diverticulitis and abscess, we will treat with IV abx for 7 days to ensure resolution. I discussed that patient needs to follow closely with a GI specialist on discharge. Patient agrees and states she will talk to her PCP for referral. Pt has appt with PCP in January 2026.   12/04/24: advance to soft diet and include fermented refrigerated foods such as kim chi, sauerkraut, and different yogurt variety with active culture, plain flavor  recommended  Diarrhea Secondary to IV antibiotics  CBC was negative for WBC elevation, low clinical suspicion for infectious diarrhea/c diff colitis at this time Discussed with patient to continue to monitor Discussed extensively for patient to start soft diet with yogurt with  active cultures, eat kim chi, and sauerkraut that are in the refrigerated section of grocery stores Patient to let RN and/or team know if patient has more than 3 more water bowel movements the rest of the day  Hyponatremia Resolved  Hypertension Continue home lisinopril  20 mg daily, atenolol  12.5 mg daily  Hyperlipidemia Continue atorvastatin  40 mg daily      Pain control - [facility]  Controlled Substance Reporting System database was reviewed. and patient was instructed, not to drive, operate heavy machinery, perform activities at heights, swimming or participation in water activities or provide baby-sitting services while on Pain, Sleep and Anxiety Medications; until their outpatient Physician has advised to do so again. Also recommended to not to take more than prescribed Pain, Sleep and Anxiety Medications.  Consultants: pharmacy, toc Procedures performed: none  Disposition: Home Diet recommendation:  Cardiac diet DISCHARGE MEDICATION: Allergies as of 12/06/2024       Reactions   Oxybutynin     Dry mouth; no taste; not effective for incontinence   Penicillins Hives   Sulfa Antibiotics Hives        Medication List     STOP taking these medications    diazepam  5 MG tablet Commonly known as: VALIUM    estradiol  0.1 MG/GM vaginal cream Commonly known as: ESTRACE    misoprostol  200 MCG tablet Commonly known as: CYTOTEC        TAKE these medications    atenolol  25 MG tablet Commonly known as: TENORMIN  Take 0.5 tablets (12.5 mg total) by mouth daily.   atorvastatin  40 MG tablet Commonly known as: LIPITOR TAKE 1 TABLET BY MOUTH EVERYDAY AT BEDTIME   CALCIUM  600 + D PO Take 2 tablets by mouth daily.   Citrucel oral powder Generic drug: methylcellulose Take 1 packet by mouth daily.   fluticasone  50 MCG/ACT nasal spray Commonly known as: FLONASE  Place 1 spray into both nostrils 2 (two) times daily.   GLUCOSAMINE-CHONDROITIN PO Take 2 capsules by mouth  daily.   lisinopril -hydrochlorothiazide  20-25 MG tablet Commonly known as: ZESTORETIC  Take 1 tablet by mouth daily.   metroNIDAZOLE  500 MG tablet Commonly known as: FLAGYL  Take 1 tablet (500 mg total) by mouth 2 (two) times daily for 1 dose. You received flagyl  500 mg am on 12/06/24, prior to discharge. Please take the final dose evening (PM) on 12/06/24, to complete your antibiotic course.   mirabegron  ER 50 MG Tb24 tablet Commonly known as: Myrbetriq  Take 1 tablet (50 mg total) by mouth daily.   ondansetron  4 MG tablet Commonly known as: ZOFRAN  Take 1 tablet (4 mg total) by mouth every 6 (six) hours as needed for nausea.   PROBIOTIC PO Take 1 tablet by mouth daily.   Xiidra  5 % Soln Generic drug: Lifitegrast  Place 1 drop into both eyes every evening.        Follow-up Information     [place] [facility]  - A Dept Of [place]. [facility]. Call.   Specialty: Nutrition & Diabetics Contact information: [address] [employer] [address] [place]  [address] [phone]              Physical exam completed with assistance of Consuelo Kerns, paramedic who was present in the home during the  encounter.  Discharge Exam: Filed Weights   11/30/24 1440 12/03/24 0922 12/03/24 1302  Weight: 77 kg 75.2 kg 75.8 kg   Physical Exam Vitals and nursing note reviewed.  Constitutional:      Appearance: She is well-developed and normal weight.  HENT:     Head: Normocephalic and atraumatic.  Eyes:     General: No scleral icterus.    Extraocular Movements: Extraocular movements intact.     Pupils: Pupils are equal, round, and reactive to light.  Cardiovascular:     Rate and Rhythm: Normal rate and regular rhythm.     Heart sounds: Normal heart sounds.  Pulmonary:     Effort: Pulmonary effort is normal. No respiratory distress.  Abdominal:     General: Bowel sounds are normal. There is no distension.     Palpations: Abdomen is soft.     Tenderness: There is  no abdominal tenderness.  Skin:    Coloration: Skin is not cyanotic or jaundiced.  Neurological:     General: No focal deficit present.     Mental Status: She is alert and oriented to person, place, and time.  Psychiatric:        Mood and Affect: Mood normal.        Behavior: Behavior normal.   Condition at discharge: good  The results of significant diagnostics from this hospitalization (including imaging, microbiology, ancillary and laboratory) are listed below for reference.   Imaging Studies: CT ABDOMEN PELVIS W CONTRAST Result Date: 11/30/2024 EXAM: CT ABDOMEN AND PELVIS WITH CONTRAST 11/30/2024 10:43:03 AM TECHNIQUE: CT of the abdomen and pelvis was performed with the administration of 100 mL of iohexol  (OMNIPAQUE ) 300 MG/ML solution. Multiplanar reformatted images are provided for review. Automated exposure control, iterative reconstruction, and/or weight-based adjustment of the mA/kV was utilized to reduce the radiation dose to as low as reasonably achievable. COMPARISON: CT abdomen and pelvis 09/28/2022. CLINICAL HISTORY: 74 year old female with left-sided abdominal pain for 2 to 3 days, possible diverticulitis. FINDINGS: LOWER CHEST: Mildly lower lung volumes, minor lung base atelectasis or scarring. No pericardial or pleural effusion. Normal heart size. LIVER: The liver is stable and within normal limits. Several small but circumscribed hypodense areas compatible with benign cysts, the largest have simple fluid density. No follow-up imaging recommended for these. GALLBLADDER AND BILE DUCTS: Gallbladder is unremarkable. No biliary ductal dilatation. SPLEEN: No acute abnormality. PANCREAS: No acute abnormality. ADRENAL GLANDS: No acute abnormality. KIDNEYS, URETERS AND BLADDER: Small benign right renal mid pole cyst. No follow-up imaging recommended for this. No stones in the kidneys or ureters. No hydronephrosis. No perinephric or periureteral stranding. Urinary bladder is unremarkable. GI  AND BOWEL: Stomach and duodenum are decompressed. Redundant large bowel in the pelvis with extensive underlying diverticulosis. Upstream large bowel retained stool in the descending colon, retained fluid in the transverse and right colon, and similar fluid filled nondilated small bowel loops throughout much of the abdomen. In the mid section of redundant sigmoid colon in the pelvis there is severe circumferential wall thickening and edema, surrounding mesenteric inflammatory stranding. A segment of about 10 cm of bowel is affected, and there are small gas containing diverticula occasionally noted in that segment. No extraluminal gas. Spiculated appearance of the large bowel wall thickening in some areas (coronal image 56). Distal sigmoid and rectum relatively spared. Secondary inflammation in the distal small bowel mesentery, no significantly inflamed small bowel loops. There is no bowel obstruction. PERITONEUM AND RETROPERITONEUM: No ascites. No free air. VASCULATURE: Calcified aortoiliac atherosclerosis  is mild to moderate per age. Major arterial structures and the portal venous system in the abdomen and pelvis appear patent. Aorta is normal in caliber. LYMPH NODES: No lymphadenopathy. REPRODUCTIVE ORGANS: Chronic coarsely calcified fibroid uterus. BONES AND SOFT TISSUES: Chronic lumbar spine degeneration in the setting of degenerative appearing lumbosacral junction ankylosis and adjacent segment grade 1 spondylolisthesis with advanced facet arthropathy. No acute osseous abnormality. No focal soft tissue abnormality. IMPRESSION: 1. Severe mid 787sigmoid wall thickening and inflammation compatible with acute colitis/diverticulitis. Possible upstream superimposed enteritis/diarrhea. No perforation or abscess. Colonoscopy follow-up following symptom resolution would be ideal as tumor can sometimes present in a similar fashion. Electronically signed by: Helayne Hurst MD 11/30/2024 12:06 PM EST RP Workstation: [id number]    Microbiology: Results for orders placed or performed in visit on 02/20/22  Urine Culture     Status: None   Collection Time: 02/20/22  9:38 AM   Specimen: Blood  Result Value Ref Range Status   MICRO NUMBER: [id number]  Final   SPECIMEN QUALITY: Adequate  Final   Sample Source NOT GIVEN  Final   STATUS: FINAL  Final   Result: No Growth  Final   Labs: CBC: Recent Labs  Lab 11/30/24 0953 12/01/24 0506 12/02/24 0503 12/04/24 1048  WBC 14.2* 7.5 5.4 4.7  HGB 12.4 10.5* 11.0* 11.6*  HCT 36.5 31.8* 32.9* 34.0*  MCV 93.1 95.5 93.2 92.1  PLT 231 179 185 251   Basic Metabolic Panel: Recent Labs  Lab 11/30/24 0953 11/30/24 1502 12/01/24 0506 12/02/24 0503 12/04/24 1048  NA 129* 136 137 138 136  K 4.3 3.7 3.7 4.0 4.3  CL 96* 100 103 105 103  CO2 22 24 23  21* 22  GLUCOSE 119* 102* 93 95 102*  BUN 17 14 11 10 8   CREATININE 1.08* 1.01* 0.99 0.86 0.89  CALCIUM  10.1 9.4 9.0 9.0 9.1   Liver Function Tests: Recent Labs  Lab 11/30/24 0953 12/01/24 0506  AST 22 14*  ALT 16 11  ALKPHOS 106 86  BILITOT 1.5* 0.9  PROT 7.3 6.1*  ALBUMIN 4.4 3.6   Discharge time spent: greater than 30 minutes.  Signed: Dr. Sherre Location: [place]  [facility] 12/06/2024 "

## 2024-12-06 NOTE — Progress Notes (Signed)
 H@H  medic out to see patient for morning visit and discharge. On arrival, I was met at the door by the patient who welcomed me in. See flowsheet for assessment. Pt denies any pain this morning and has no other complaints. Abx were started soon after arrival. Medications were given with virtual RN oversight. A video call was conducted and completed with the RN and provider. No labs were needed today. Pt was given TOC medications and discharge paperwork. Virtual RN Shanda went over the discharge summary with the patient in my presence. All questions and concerns were answered at this time. The patient was instructed to take her last dose of abx tonight. All CH and medical equipment were removed from the home. No other tasks were needed. H@H  visit complete.

## 2024-12-06 NOTE — Discharge Instructions (Addendum)
Diverticulitis  Diverticulitis happens when poop (stool) and bacteria get trapped in small pouches in the colon called diverticula. These pouches may form if you have a condition called diverticulosis. When the poop and bacteria get trapped, it can cause an infection and inflammation. Diverticulitis may cause severe stomach pain and diarrhea. It can also lead to tissue damage in your colon. This can cause bleeding or blockage. In some cases, the diverticula may burst (rupture). This can cause infected poop to go into other parts of your abdomen. What are the causes? This condition is caused by poop getting trapped in the diverticula. This allows bacteria to grow. It can lead to inflammation and infection. What increases the risk? You are more likely to get this condition if you have diverticulosis. You are also more at risk if: You are overweight or obese. You do not get enough exercise. You drink alcohol. You smoke. You eat a lot of red meat, such as beef, pork, or lamb. You do not get enough fiber. Foods high in fiber include fruits, vegetables, beans, nuts, and whole grains. You are over 35 years of age. What are the signs or symptoms? Symptoms of this condition may include: Pain and tenderness in the abdomen. This pain is often felt on the left side but may occur in other spots. Fever and chills. Nausea and vomiting. Cramping. Bloating. Changes in how often you poop. Blood in your poop. How is this diagnosed? This condition is diagnosed based on your medical history and a physical exam. You may also have tests done to make sure there is nothing else causing your condition. These tests may include: Blood tests. Tests done on your pee (urine). A CT scan of the abdomen. You may need to have a colonoscopy. This is an exam to look at your whole large intestine. During the exam, a tube is put into the opening of your butt (anus) and then moved into your rectum, colon, and other parts of  the large intestine. This exam is done to look at the diverticula. It can also see if there is something else that may be causing your symptoms. How is this treated? Most cases are mild and can be treated at home. You may be told to: Take over-the-counter pain medicine. Only eat and drink clear liquids. Take antibiotics. Rest. More severe cases may need to be treated at a hospital. Treatment may include: Not eating or drinking. Taking pain medicines. Getting antibiotics through an IV. Getting fluids and nutrition through an IV. Surgery. Follow these instructions at home: Medicines Take over-the-counter and prescription medicines only as told by your health care provider. These include fiber supplements, probiotics, and medicines to soften your poop (stool softeners). If you were prescribed antibiotics, take them as told by your provider. Do not stop using the antibiotic even if you start to feel better. Ask your provider if the medicine prescribed to you requires you to avoid driving or using machinery. Eating and drinking  Follow the diet told by your provider. You may need to only eat and drink liquids. After your symptoms get better, you may be able to return to a more normal diet. You may be told to eat at least 25 grams (25 g) of fiber each day. Fiber makes it easier to poop. Healthy sources of fiber include: Berries. One cup has 4-8 g of fiber. Beans or lentils. One-half cup has 5-8 g of fiber. Green vegetables. One cup has 4 g of fiber. Avoid eating red meat.  General instructions Do not use any products that contain nicotine or tobacco. These products include cigarettes, chewing tobacco, and vaping devices, such as e-cigarettes. If you need help quitting, ask your provider. Exercise for at least 30 minutes, 3 times a week. Exercise hard enough to raise your heart rate and break a sweat. Contact a health care provider if: Your pain gets worse. Your pooping does not go back to  normal. Your symptoms do not get better with treatment. Your symptoms get worse all of a sudden. You have a fever. You vomit more than one time. Your poop is bloody, black, or tarry. This information is not intended to replace advice given to you by your health care provider. Make sure you discuss any questions you have with your health care provider. Document Revised: 08/17/2022 Document Reviewed: 08/17/2022 Elsevier Patient Education  2024 [employer].

## 2024-12-06 NOTE — Progress Notes (Signed)
 Video call completed with patient. Patient stable, watching TV. No complaints at this time. All bedtime medications were administered as ordered and patient was encouraged to call RN via tablet or phone call if needed overnight.

## 2024-12-06 NOTE — Progress Notes (Signed)
 Communicated with patient via video tablet. Patient appears comfortable in the chair. PIV removed. AVS paperwork given to patient. Discharge instructions discussed with patient and her spouse, Lynwood. Patients question answered to satisfaction. Patient agreeable with discharge plan.

## 2024-12-08 ENCOUNTER — Telehealth: Payer: Self-pay

## 2024-12-08 NOTE — Transitions of Care (Post Inpatient/ED Visit) (Signed)
" ° °  12/08/2024  Name: Caitlin Bowers MRN: [id number] DOB: Nov 08, 1951  Today's TOC FU Call Status: Today's TOC FU Call Status:: Successful TOC FU Call Completed TOC FU Call Complete Date: 12/08/24  Patient's Name and Date of Birth confirmed. DOB, Name  Transition Care Management Follow-up Telephone Call Date of Discharge: 12/06/24 Discharge Facility: [facility]) Type of Discharge: Inpatient Admission (hospital at home) Primary Inpatient Discharge Diagnosis:: Diverticultis How have you been since you were released from the hospital?: Better Any questions or concerns?: No  Items Reviewed: Did you receive and understand the discharge instructions provided?: Yes Medications obtained,verified, and reconciled?: Yes (Medications Reviewed) Any new allergies since your discharge?: No Dietary orders reviewed?: NA Do you have support at home?: Yes  Medications Reviewed Today: Medications Reviewed Today     Reviewed by Lavelle Charmaine NOVAK, LPN (Licensed Practical Nurse) on 12/08/24 at 1124  Med List Status: <None>   Medication Order Taking? Sig Documenting Provider Last Dose Status Informant  atenolol  (TENORMIN ) 25 MG tablet [id number] No Take 0.5 tablets (12.5 mg total) by mouth daily. Ozell Heron HERO, MD 11/30/2024 Active Self, Pharmacy Records  atorvastatin  (LIPITOR) 40 MG tablet [id number] No TAKE 1 TABLET BY MOUTH EVERYDAY AT BEDTIME Ozell Heron HERO, MD 11/30/2024 Active Self, Pharmacy Records  Calcium  Carb-Cholecalciferol (CALCIUM  600 + D PO) [id number]  Take 2 tablets by mouth daily. [provider]  Active Self, Pharmacy Records  fluticasone  (FLONASE ) 50 MCG/ACT nasal spray [id number] No Place 1 spray into both nostrils 2 (two) times daily. [provider] 11/30/2024 Active Self, Pharmacy Records  GLUCOSAMINE-CHONDROITIN PO [id number] No Take 2 capsules by mouth daily. [provider] 11/30/2024 Active Self, Pharmacy Records  Lifitegrast  (XIIDRA ) 5 %  SOLN [id number] No Place 1 drop into both eyes every evening. [provider] 11/30/2024 Active Self, Pharmacy Records  lisinopril -hydrochlorothiazide  (ZESTORETIC ) 20-25 MG tablet [id number] No Take 1 tablet by mouth daily. Ozell Heron HERO, MD 11/30/2024 Active Self, Pharmacy Records  methylcellulose (CITRUCEL) oral powder [id number]  Take 1 packet by mouth daily. [provider]  Active Self, Pharmacy Records  mirabegron  ER (MYRBETRIQ ) 50 MG TB24 tablet [id number] No Take 1 tablet (50 mg total) by mouth daily. Ozell Heron HERO, MD 11/30/2024 Active Self, Pharmacy Records  ondansetron  (ZOFRAN ) 4 MG tablet [id number]  Take 1 tablet (4 mg total) by mouth every 6 (six) hours as needed for nausea. Cox, Amy N, DO  Active   Probiotic Product (PROBIOTIC PO) [id number] No Take 1 tablet by mouth daily. [provider] 11/30/2024 Active Self, Pharmacy Records            Home Care and Equipment/Supplies: Were Home Health Services Ordered?: NA Any new equipment or medical supplies ordered?: NA  Functional Questionnaire: Do you need assistance with bathing/showering or dressing?: No Do you need assistance with meal preparation?: No Do you need assistance with eating?: No Do you have difficulty maintaining continence: No Do you need assistance with getting out of bed/getting out of a chair/moving?: No Do you have difficulty managing or taking your medications?: No  Follow up appointments reviewed: PCP Follow-up appointment confirmed?: Yes Date of PCP follow-up appointment?: 12/09/24 Follow-up Provider: Dr. Ozell Do you need transportation to your follow-up appointment?: No Do you understand care options if your condition(s) worsen?: Yes-patient verbalized understanding    SIGNATURE Charmaine Lavelle, LPN [facility] Health Advisor [place] [facility] You Are. We Are. [facility] Direct Dial [phone]  "

## 2024-12-09 ENCOUNTER — Encounter: Payer: Self-pay | Admitting: Family Medicine

## 2024-12-09 ENCOUNTER — Other Ambulatory Visit (HOSPITAL_BASED_OUTPATIENT_CLINIC_OR_DEPARTMENT_OTHER): Payer: Self-pay

## 2024-12-09 ENCOUNTER — Ambulatory Visit (INDEPENDENT_AMBULATORY_CARE_PROVIDER_SITE_OTHER): Admitting: Family Medicine

## 2024-12-09 VITALS — BP 120/72 | HR 70 | Temp 98.0°F | Ht 70.0 in | Wt 164.9 lb

## 2024-12-09 DIAGNOSIS — R3915 Urgency of urination: Secondary | ICD-10-CM | POA: Diagnosis not present

## 2024-12-09 DIAGNOSIS — E785 Hyperlipidemia, unspecified: Secondary | ICD-10-CM | POA: Diagnosis not present

## 2024-12-09 DIAGNOSIS — I10 Essential (primary) hypertension: Secondary | ICD-10-CM

## 2024-12-09 DIAGNOSIS — K5792 Diverticulitis of intestine, part unspecified, without perforation or abscess without bleeding: Secondary | ICD-10-CM

## 2024-12-09 MED ORDER — MIRABEGRON ER 50 MG PO TB24: 50.0000 mg | ORAL_TABLET | Freq: Every day | ORAL | 3 refills | Status: AC

## 2024-12-09 MED ORDER — LISINOPRIL-HYDROCHLOROTHIAZIDE 20-25 MG PO TABS: 1.0000 | ORAL_TABLET | Freq: Every day | ORAL | 3 refills | Status: AC

## 2024-12-09 MED ORDER — ATENOLOL 25 MG PO TABS: 12.5000 mg | ORAL_TABLET | Freq: Every day | ORAL | 3 refills | Status: AC

## 2024-12-09 MED ORDER — ATORVASTATIN CALCIUM 40 MG PO TABS: ORAL_TABLET | ORAL | 3 refills | Status: AC

## 2024-12-09 NOTE — Progress Notes (Signed)
 "  Established Patient Office Visit  Subjective   Patient ID: Caitlin Bowers, female    DOB: October 20, 1951  Age: 74 y.o. MRN: [id number]  Chief Complaint  Patient presents with   Hospitalization Follow-up    HPI Discussed the use of AI scribe software for clinical note transcription with the patient, who gave verbal consent to proceed.  History of Present Illness   Caitlin Bowers is a 74 year old female with recurrent diverticulitis who presents for follow-up after recent hospitalization.  She was admitted through the emergency room on December 28 for her third episode of diverticulitis in three years and spent three days at [facility]. She was treated with antibiotics and has completed the course.  Since discharge, diverticulitis symptoms have largely resolved. She has mild morning diarrhea and is eating a soft diet with fish and chicken.  A 2022 colonoscopy showed multiple diverticula in the sigmoid colon.  She takes atorvastatin , atenolol , and Myrbetriq  with no changes since hospitalization.  She has no new pain, urinary symptoms, or other concerns. Her blood pressure is well controlled on her current regimen.      Current Outpatient Medications  Medication Instructions   atenolol  (TENORMIN ) 12.5 mg, Oral, Daily   atorvastatin  (LIPITOR) 40 MG tablet TAKE 1 TABLET BY MOUTH EVERYDAY AT BEDTIME   Calcium  Carb-Cholecalciferol (CALCIUM  600 + D PO) 2 tablets, Daily   fluticasone  (FLONASE ) 50 MCG/ACT nasal spray 1 spray, 2 times daily   GLUCOSAMINE-CHONDROITIN PO 2 capsules, Daily   Lifitegrast  (XIIDRA ) 5 % SOLN 1 drop, Every evening   lisinopril -hydrochlorothiazide  (ZESTORETIC ) 20-25 MG tablet 1 tablet, Oral, Daily   methylcellulose (CITRUCEL) oral powder 1 packet, Daily   mirabegron  ER (MYRBETRIQ ) 50 mg, Oral, Daily   ondansetron  (ZOFRAN ) 4 mg, Oral, Every 6 hours PRN   Probiotic Product (PROBIOTIC PO) 1 tablet, Daily    Patient Active Problem List    Diagnosis Date Noted   Diarrhea 12/04/2024   Diverticulitis 11/30/2024   Hyponatremia 11/30/2024   Diverticulosis of sigmoid colon 10/07/2024   Prediabetes 06/30/2024   Incisional hernia 07/06/2023   Graves disease 12/12/2022   Intramural uterine fibroid 04/30/2022   History of appendectomy 02/13/2022   Pelvic abscess in female 02/13/2022   History of exploratory laparotomy 02/06/2022   Urinary urgency 11/05/2019   Hyperlipidemia 11/05/2019   Hypertension 11/05/2019   History of Graves' disease 11/05/2019   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  All other systems reviewed and are negative.     Objective:     BP 120/72   Pulse 70   Temp 98 F (36.7 C) (Oral)   Ht 5' 10 (1.778 m)   Wt 164 lb 14.4 oz (74.8 kg)   SpO2 98%   BMI 23.66 kg/m    Physical Exam Vitals reviewed.  Constitutional:      Appearance: Normal appearance. She is well-groomed and normal weight.  Cardiovascular:     Rate and Rhythm: Normal rate and regular rhythm.     Heart sounds: S1 normal and S2 normal.  Pulmonary:     Effort: Pulmonary effort is normal.     Breath sounds: Normal breath sounds and air entry.  Abdominal:     General: Bowel sounds are normal.     Tenderness: There is no abdominal tenderness.  Neurological:     Mental Status: She is alert and oriented to person, place, and  time. Mental status is at baseline.     Gait: Gait is intact.  Psychiatric:        Mood and Affect: Mood and affect normal.        Speech: Speech normal.        Behavior: Behavior normal.        Judgment: Judgment normal.      No results found for any visits on 12/09/24.    The 10-year ASCVD risk score (Arnett DK, et al., 2019) is: 18.4%    Assessment & Plan:  Diverticulitis -     Ambulatory referral to Gastroenterology  Hypertension, unspecified type -     Atenolol ; Take 0.5 tablets (12.5 mg total) by mouth  daily.  Dispense: 90 tablet; Refill: 3 -     Lisinopril -hydroCHLOROthiazide ; Take 1 tablet by mouth daily.  Dispense: 90 tablet; Refill: 3  Hyperlipidemia, unspecified hyperlipidemia type -     Atorvastatin  Calcium ; TAKE 1 TABLET BY MOUTH EVERYDAY AT BEDTIME  Dispense: 90 tablet; Refill: 3  Urinary urgency -     Mirabegron  ER; Take 1 tablet (50 mg total) by mouth daily.  Dispense: 90 tablet; Refill: 3   Assessment and Plan    Diverticulitis Recurrent diverticulitis with three episodes over three years. Recent hospitalization for diverticulitis with antibiotic treatment. Symptoms have resolved, but mild diarrhea persists in the mornings, likely due to antibiotics. No current pain or significant symptoms. Discussion of potential colon resection if recurrent episodes continue, but current interval between episodes may not necessitate surgery. - Referred to GI specialist for further evaluation and management. - Advised gradual advancement to normal diet as tolerated.  Essential hypertension Blood pressure is well-controlled at 120/72 mmHg. She is compliant with medication regimen. - Continue current antihypertensive medications: atenolol  and lisinopril  HCT.  Hyperlipidemia Cholesterol levels need to be checked as part of annual labs. Recent hospital labs did not include cholesterol. - Will order cholesterol test during next visit.  General health maintenance COVID-19 vaccination is up to date as of September 26th, 2024. Annual wellness visit needs to be rescheduled to after January 30th, 2026, due to [facility] requirements. - Rescheduled annual wellness visit to after January 30th, 2026. - Ensured COVID-19 vaccination status is updated in records.        Return for AWV needs moved to AFTER 1/29 -- please reschedule this for her.    Caitlin CHRISTELLA Sharper, MD "

## 2024-12-18 ENCOUNTER — Ambulatory Visit (INDEPENDENT_AMBULATORY_CARE_PROVIDER_SITE_OTHER): Admitting: Family Medicine

## 2024-12-18 ENCOUNTER — Encounter: Payer: Self-pay | Admitting: Family Medicine

## 2024-12-18 VITALS — BP 116/70 | HR 75 | Temp 98.5°F | Ht 70.0 in | Wt 166.7 lb

## 2024-12-18 DIAGNOSIS — R319 Hematuria, unspecified: Secondary | ICD-10-CM

## 2024-12-18 DIAGNOSIS — R350 Frequency of micturition: Secondary | ICD-10-CM | POA: Diagnosis not present

## 2024-12-18 LAB — POC URINALSYSI DIPSTICK (AUTOMATED)
Bilirubin, UA: NEGATIVE
Glucose, UA: NEGATIVE
Ketones, UA: NEGATIVE
Leukocytes, UA: NEGATIVE
Nitrite, UA: NEGATIVE
Protein, UA: NEGATIVE
Spec Grav, UA: 1.01
Urobilinogen, UA: 0.2 U/dL
pH, UA: 6.5

## 2024-12-18 MED ORDER — NITROFURANTOIN MONOHYD MACRO 100 MG PO CAPS: 100.0000 mg | ORAL_CAPSULE | Freq: Two times a day (BID) | ORAL | 0 refills | Status: DC

## 2024-12-18 NOTE — Progress Notes (Signed)
 "  Established Patient Office Visit  Subjective   Patient ID: Caitlin Bowers, female    DOB: 1951-11-29  Age: 74 y.o. MRN: [id number]  Chief Complaint  Patient presents with   Urinary Frequency    X3 days, denies any other symptoms    Urinary Frequency  Associated symptoms include frequency.   Discussed the use of AI scribe software for clinical note transcription with the patient, who gave verbal consent to proceed.  History of Present Illness   Caitlin Bowers is a 74 year old female who presents with increased urinary frequency.  Over the past three days she has had increased urinary frequency, now urinating about every three hours, compared with her usual pattern of voiding around 5 AM and again at 8 AM. She denies fever, chills, abdominal pain, or back pain.  She takes Myrbetriq  for bladder symptoms and had been stable until this change. She was recently treated for diverticulitis with a one-week course of IV antibiotics.       Current Outpatient Medications  Medication Instructions   atenolol  (TENORMIN ) 12.5 mg, Oral, Daily   atorvastatin  (LIPITOR) 40 MG tablet TAKE 1 TABLET BY MOUTH EVERYDAY AT BEDTIME   Calcium  Carb-Cholecalciferol (CALCIUM  600 + D PO) 2 tablets, Daily   fluticasone  (FLONASE ) 50 MCG/ACT nasal spray 1 spray, 2 times daily   GLUCOSAMINE-CHONDROITIN PO 2 capsules, Daily   Lifitegrast  (XIIDRA ) 5 % SOLN 1 drop, Every evening   lisinopril -hydrochlorothiazide  (ZESTORETIC ) 20-25 MG tablet 1 tablet, Oral, Daily   methylcellulose (CITRUCEL) oral powder 1 packet, Daily   mirabegron  ER (MYRBETRIQ ) 50 mg, Oral, Daily   nitrofurantoin  (macrocrystal-monohydrate) (MACROBID ) 100 mg, Oral, 2 times daily   ondansetron  (ZOFRAN ) 4 mg, Oral, Every 6 hours PRN   Probiotic Product (PROBIOTIC PO) 1 tablet, Daily    Patient Active Problem List   Diagnosis Date Noted   Diarrhea 12/04/2024   Diverticulitis 11/30/2024   Hyponatremia 11/30/2024    Diverticulosis of sigmoid colon 10/07/2024   Prediabetes 06/30/2024   Incisional hernia 07/06/2023   Graves disease 12/12/2022   Intramural uterine fibroid 04/30/2022   History of appendectomy 02/13/2022   Pelvic abscess in female 02/13/2022   History of exploratory laparotomy 02/06/2022   Urinary urgency 11/05/2019   Hyperlipidemia 11/05/2019   Hypertension 11/05/2019   History of Graves' disease 11/05/2019   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      Objective:     BP 116/70   Pulse 75   Temp 98.5 F (36.9 C) (Oral)   Ht 5' 10 (1.778 m)   Wt 166 lb 11.2 oz (75.6 kg)   SpO2 99%   BMI 23.92 kg/m    Physical Exam Vitals reviewed.  Constitutional:      Appearance: Normal appearance. She is normal weight.  Pulmonary:     Effort: Pulmonary effort is normal.  Abdominal:     General: Bowel sounds are normal.     Tenderness: There is no abdominal tenderness. There is no right CVA tenderness, left CVA tenderness or guarding.  Neurological:     Mental Status: She is alert.      Results for orders placed or performed in visit on 12/18/24  POCT Urinalysis Dipstick (Automated)  Result Value Ref Range   Color, UA yellow    Clarity, UA clear    Glucose, UA Negative Negative   Bilirubin, UA negative    Ketones, UA negative  Spec Grav, UA 1.010 1.010 - 1.025   Blood, UA 1+    pH, UA 6.5 5.0 - 8.0   Protein, UA Negative Negative   Urobilinogen, UA 0.2 0.2 or 1.0 E.U./dL   Nitrite, UA negative    Leukocytes, UA Negative Negative      The 10-year ASCVD risk score (Arnett DK, et al., 2019) is: 17.3%    Assessment & Plan:  Urinary frequency -     POCT Urinalysis Dipstick (Automated) -     Urine Culture; Future -     Nitrofurantoin  Monohyd Macro; Take 1 capsule (100 mg total) by mouth 2 (two) times daily.  Dispense: 14 capsule; Refill: 0  Hematuria, unspecified type -     Urine Culture; Future    Assessment and Plan    Suspected urinary tract infection with hematuria and urinary frequency Increased urinary frequency over the last three days with normal urine output. No fever, chills, or abdominal discomfort. Urinalysis shows hematuria but no leukocytes or nitrites. Differential diagnosis includes urinary tract infection and kidney stones, though the latter is less likely due to absence of pain. Recent antibiotic use may have masked symptoms or led to a yeast infection. Diluted urine may affect dipstick results. - Sent urine for culture to identify bacterial growth. - Prescribed Macrobid  (nitrofurantoin ) to start immediately due to upcoming weekend and potential symptom worsening. - If urine culture is negative, discontinue antibiotics. - If culture grows bacteria, will adjust antibiotics as needed.        No follow-ups on file.    Heron CHRISTELLA Sharper, MD "

## 2024-12-19 LAB — URINE CULTURE
MICRO NUMBER:: 17473991
Result:: NO GROWTH
SPECIMEN QUALITY:: ADEQUATE

## 2024-12-22 ENCOUNTER — Ambulatory Visit: Payer: Self-pay | Admitting: Family Medicine

## 2024-12-22 ENCOUNTER — Encounter (HOSPITAL_BASED_OUTPATIENT_CLINIC_OR_DEPARTMENT_OTHER): Payer: Self-pay | Admitting: *Deleted

## 2024-12-22 ENCOUNTER — Emergency Department (HOSPITAL_BASED_OUTPATIENT_CLINIC_OR_DEPARTMENT_OTHER)

## 2024-12-22 ENCOUNTER — Emergency Department (HOSPITAL_BASED_OUTPATIENT_CLINIC_OR_DEPARTMENT_OTHER)
Admission: EM | Admit: 2024-12-22 | Discharge: 2024-12-22 | Disposition: A | Discharge status: Home / Self Care | Attending: Emergency Medicine | Admitting: Emergency Medicine

## 2024-12-22 ENCOUNTER — Encounter: Payer: Self-pay | Admitting: Physician Assistant

## 2024-12-22 ENCOUNTER — Other Ambulatory Visit: Payer: Self-pay

## 2024-12-22 DIAGNOSIS — I10 Essential (primary) hypertension: Secondary | ICD-10-CM | POA: Insufficient documentation

## 2024-12-22 DIAGNOSIS — Z79899 Other long term (current) drug therapy: Secondary | ICD-10-CM | POA: Diagnosis not present

## 2024-12-22 DIAGNOSIS — R110 Nausea: Secondary | ICD-10-CM | POA: Diagnosis not present

## 2024-12-22 DIAGNOSIS — R109 Unspecified abdominal pain: Secondary | ICD-10-CM | POA: Diagnosis present

## 2024-12-22 LAB — COMPREHENSIVE METABOLIC PANEL WITH GFR
ALT: 24 U/L (ref 0–44)
AST: 24 U/L (ref 15–41)
Albumin: 4.5 g/dL (ref 3.5–5.0)
Alkaline Phosphatase: 97 U/L (ref 38–126)
Anion gap: 13 (ref 5–15)
BUN: 17 mg/dL (ref 8–23)
CO2: 24 mmol/L (ref 22–32)
Calcium: 10.1 mg/dL (ref 8.9–10.3)
Chloride: 97 mmol/L — ABNORMAL LOW (ref 98–111)
Creatinine, Ser: 0.94 mg/dL (ref 0.44–1.00)
GFR, Estimated: >60 mL/min
Glucose, Bld: 111 mg/dL — ABNORMAL HIGH (ref 70–99)
Potassium: 3.9 mmol/L (ref 3.5–5.1)
Sodium: 133 mmol/L — ABNORMAL LOW (ref 135–145)
Total Bilirubin: 0.8 mg/dL (ref 0.0–1.2)
Total Protein: 7 g/dL (ref 6.5–8.1)

## 2024-12-22 LAB — CBC
HCT: 35.1 % — ABNORMAL LOW (ref 36.0–46.0)
Hemoglobin: 12 g/dL (ref 12.0–15.0)
MCH: 31.6 pg (ref 26.0–34.0)
MCHC: 34.2 g/dL (ref 30.0–36.0)
MCV: 92.4 fL (ref 80.0–100.0)
Platelets: 215 K/uL (ref 150–400)
RBC: 3.8 MIL/uL — ABNORMAL LOW (ref 3.87–5.11)
RDW: 12.9 % (ref 11.5–15.5)
WBC: 6.4 K/uL (ref 4.0–10.5)
nRBC: 0 % (ref 0.0–0.2)

## 2024-12-22 LAB — URINALYSIS, ROUTINE W REFLEX MICROSCOPIC
Bilirubin Urine: NEGATIVE
Glucose, UA: NEGATIVE mg/dL
Ketones, ur: NEGATIVE mg/dL
Leukocytes,Ua: NEGATIVE
Nitrite: NEGATIVE
Protein, ur: NEGATIVE mg/dL
Specific Gravity, Urine: 1.007 (ref 1.005–1.030)
pH: 6.5 (ref 5.0–8.0)

## 2024-12-22 LAB — LIPASE, BLOOD: Lipase: 47 U/L (ref 11–51)

## 2024-12-22 MED ORDER — IOHEXOL 300 MG/ML  SOLN
100.0000 mL | Freq: Once | INTRAMUSCULAR | Status: AC | PRN
  Administered 2024-12-22: 100 mL via INTRAVENOUS

## 2024-12-22 NOTE — ED Triage Notes (Signed)
 Pt is here with abdominal pain and pressure.  Pt states that it feels just like when she was hospitalized for diverticulitis recently.  Pt states that she was discharged about 2 weeks ago and told to come to ED if her abdominal discomfort returns.  No fever or chills.  Pt has some nausea.

## 2024-12-22 NOTE — ED Provider Notes (Signed)
 " [facility] AT [facility] Provider Note   CSN: [id number] Arrival date & time: 12/22/24  2021     Patient presents with: Abdominal Pain   Caitlin Bowers is a 74 y.o. female.    Abdominal Pain    Patient has a history of Graves' disease hypertension diverticulitis.  Patient had another episode of diverticulitis the end of last month.  Patient was admitted on December 28.  Patient was instructed if she had any recurrent symptoms to come back to the hospital.  Patient states in the last day or so she has had some increasing discomfort in her abdomen.  She feels like she is developing her diverticulitis again.  She has had some nausea no fevers or chills no vomiting.  No diarrhea  Prior to Admission medications  Medication Sig Start Date End Date Taking? Authorizing Provider  atenolol  (TENORMIN ) 25 MG tablet Take 0.5 tablets (12.5 mg total) by mouth daily. 12/09/24   Ozell Heron HERO, MD  atorvastatin  (LIPITOR) 40 MG tablet TAKE 1 TABLET BY MOUTH EVERYDAY AT BEDTIME 12/09/24   Ozell Heron HERO, MD  Calcium  Carb-Cholecalciferol (CALCIUM  600 + D PO) Take 2 tablets by mouth daily.    [provider]  fluticasone  (FLONASE ) 50 MCG/ACT nasal spray Place 1 spray into both nostrils 2 (two) times daily. 11/23/11   [provider]  GLUCOSAMINE-CHONDROITIN PO Take 2 capsules by mouth daily.    [provider]  Lifitegrast  (XIIDRA ) 5 % SOLN Place 1 drop into both eyes every evening.    [provider]  lisinopril -hydrochlorothiazide  (ZESTORETIC ) 20-25 MG tablet Take 1 tablet by mouth daily. 12/09/24   Ozell Heron HERO, MD  methylcellulose (CITRUCEL) oral powder Take 1 packet by mouth daily.    [provider]  mirabegron  ER (MYRBETRIQ ) 50 MG TB24 tablet Take 1 tablet (50 mg total) by mouth daily. 12/09/24   Ozell Heron HERO, MD  nitrofurantoin , macrocrystal-monohydrate, (MACROBID ) 100 MG capsule Take 1 capsule (100 mg  total) by mouth 2 (two) times daily. 12/18/24   Ozell Heron HERO, MD  ondansetron  (ZOFRAN ) 4 MG tablet Take 1 tablet (4 mg total) by mouth every 6 (six) hours as needed for nausea. 12/06/24   Cox, Amy N, DO  Probiotic Product (PROBIOTIC PO) Take 1 tablet by mouth daily.    [provider]    Allergies: Oxybutynin , Penicillins, and Sulfa antibiotics    Review of Systems  Gastrointestinal:  Positive for abdominal pain.    Updated Vital Signs BP (!) 126/96   Pulse 80   Temp 98.1 F (36.7 C) (Oral)   Resp 16   Ht 1.778 m (5' 10)   Wt 75.6 kg   SpO2 100%   BMI 23.92 kg/m   Physical Exam Vitals and nursing note reviewed.  Constitutional:      General: She is not in acute distress.    Appearance: She is well-developed.  HENT:     Head: Normocephalic and atraumatic.     Right Ear: External ear normal.     Left Ear: External ear normal.  Eyes:     General: No scleral icterus.       Right eye: No discharge.        Left eye: No discharge.     Conjunctiva/sclera: Conjunctivae normal.  Neck:     Trachea: No tracheal deviation.  Cardiovascular:     Rate and Rhythm: Normal rate and regular rhythm.  Pulmonary:     Effort: Pulmonary  effort is normal. No respiratory distress.     Breath sounds: Normal breath sounds. No stridor. No wheezing or rales.  Abdominal:     General: Bowel sounds are normal. There is no distension.     Palpations: Abdomen is soft.     Tenderness: There is no abdominal tenderness. There is no guarding or rebound.  Musculoskeletal:        General: No tenderness or deformity.     Cervical back: Neck supple.  Skin:    General: Skin is warm and dry.     Findings: No rash.  Neurological:     General: No focal deficit present.     Mental Status: She is alert.     Cranial Nerves: No cranial nerve deficit, dysarthria or facial asymmetry.     Sensory: No sensory deficit.     Motor: No abnormal muscle tone or seizure activity.     Coordination:  Coordination normal.  Psychiatric:        Mood and Affect: Mood normal.     (all labs ordered are listed, but only abnormal results are displayed) Labs Reviewed  COMPREHENSIVE METABOLIC PANEL WITH GFR - Abnormal; Notable for the following components:      Result Value   Sodium 133 (*)    Chloride 97 (*)    Glucose, Bld 111 (*)    All other components within normal limits  CBC - Abnormal; Notable for the following components:   RBC 3.80 (*)    HCT 35.1 (*)    All other components within normal limits  URINALYSIS, ROUTINE W REFLEX MICROSCOPIC - Abnormal; Notable for the following components:   Hgb urine dipstick SMALL (*)    Bacteria, UA RARE (*)    All other components within normal limits  LIPASE, BLOOD    EKG: None  Radiology: CT ABDOMEN PELVIS W CONTRAST Result Date: 12/22/2024 EXAM: CT ABDOMEN AND PELVIS WITH CONTRAST 12/22/2024 10:09:54 PM TECHNIQUE: CT of the abdomen and pelvis was performed with the administration of 100 mL iohexol  (OMNIPAQUE ) 300 MG/ML solution. Multiplanar reformatted images are provided for review. Automated exposure control, iterative reconstruction, and/or weight-based adjustment of the mA/kV was utilized to reduce the radiation dose to as low as reasonably achievable. COMPARISON: 11/30/2024 CLINICAL HISTORY: Diverticulitis, complication suspected. FINDINGS: LOWER CHEST: No acute abnormality. LIVER: Scattered hepatic cysts, benign. GALLBLADDER AND BILE DUCTS: Gallbladder is unremarkable. No biliary ductal dilatation. SPLEEN: No acute abnormality. PANCREAS: No acute abnormality. ADRENAL GLANDS: No acute abnormality. KIDNEYS, URETERS AND BLADDER: Simple right upper pole renal cyst, benign (Bosniak 1), not requiring further follow-up. No stones in the kidneys or ureters. No hydronephrosis. No perinephric or periureteral stranding. Urinary bladder is unremarkable. GI AND BOWEL: Stomach demonstrates no acute abnormality. Scattered colonic diverticulosis. Resolved  sigmoid diverticulitis/colitis. There is no bowel obstruction. PERITONEUM AND RETROPERITONEUM: No ascites. No free air. VASCULATURE: Aorta is normal in caliber. Scattered aortoiliac atherosclerosis. LYMPH NODES: No lymphadenopathy. REPRODUCTIVE ORGANS: Calcified uterine fibroids. BONES AND SOFT TISSUES: Mild degenerative changes of the lower thoracic spine. Grade 1 anterolisthesis of L4 on L5. No focal soft tissue abnormality. IMPRESSION: 1. Resolved sigmoid diverticulitis/colitis. 2. Ancillary findings, as above. Electronically signed by: Pinkie Pebbles MD 12/22/2024 10:12 PM EST RP Workstation: [id number]     Procedures   Medications Ordered in the ED  iohexol  (OMNIPAQUE ) 300 MG/ML solution 100 mL (100 mLs Intravenous Contrast Given 12/22/24 2155)    Clinical Course as of 12/22/24 2305  Mon Dec 22, 2024  2134 CBC(!) nl [JK]  2134 Comprehensive metabolic panel(!) nl [JK]  2134 Urinalysis, Routine w reflex microscopic -Urine, Clean Catch(!) nl [JK]  2246 CT ABDOMEN PELVIS W CONTRAST CT [JK]  2247 CT scan shows resolved diverticulitis [JK]    Clinical Course User Index [JK] Randol Simmonds, MD                                 Medical Decision Making Problems Addressed: Abdominal pain, unspecified abdominal location: acute illness or injury that poses a threat to life or bodily functions  Amount and/or Complexity of Data Reviewed Labs: ordered. Decision-making details documented in ED Course. Radiology: ordered and independent interpretation performed. Decision-making details documented in ED Course.  Risk Prescription drug management.   Patient presented to the ED for evaluation of recurrent abdominal pain.  Patient recently treated for diverticulitis.  Laboratory test reassuring.  No signs of leukocytosis.  No anemia.  No signs of hepatitis or pancreatitis.  CT scan fortunately does not show any signs of acute abnormality.  No evidence of recurrent diverticulitis.  Etiology of  symptoms unclear however patient is stable for outpatient follow-up with her GI doctor for further evaluation  Evaluation and diagnostic testing in the emergency department does not suggest an emergent condition requiring admission or immediate intervention beyond what has been performed at this time.  The patient is safe for discharge and has been instructed to return immediately for worsening symptoms, change in symptoms or any other concerns.     Final diagnoses:  Abdominal pain, unspecified abdominal location    ED Discharge Orders     None          Randol Simmonds, MD 12/22/24 2307  "

## 2024-12-22 NOTE — Discharge Instructions (Signed)
 The CT scan did not show any acute abnormality.  It did not show any signs of recurrence of your diverticulitis.  Follow-up with your GI doctor as planned  for further evaluation of your symptoms

## 2024-12-31 ENCOUNTER — Ambulatory Visit: Admitting: Family Medicine

## 2025-01-06 ENCOUNTER — Encounter: Payer: Self-pay | Admitting: Family Medicine

## 2025-01-06 ENCOUNTER — Ambulatory Visit (INDEPENDENT_AMBULATORY_CARE_PROVIDER_SITE_OTHER): Admitting: Family Medicine

## 2025-01-06 ENCOUNTER — Ambulatory Visit: Payer: Self-pay | Admitting: Family Medicine

## 2025-01-06 VITALS — BP 144/96 | HR 70 | Temp 98.4°F | Ht 69.0 in | Wt 166.2 lb

## 2025-01-06 DIAGNOSIS — R7303 Prediabetes: Secondary | ICD-10-CM | POA: Diagnosis not present

## 2025-01-06 DIAGNOSIS — Z0000 Encounter for general adult medical examination without abnormal findings: Secondary | ICD-10-CM | POA: Diagnosis not present

## 2025-01-06 DIAGNOSIS — E785 Hyperlipidemia, unspecified: Secondary | ICD-10-CM | POA: Diagnosis not present

## 2025-01-06 LAB — LIPID PANEL
Cholesterol: 148 mg/dL (ref 28–200)
HDL: 50.2 mg/dL
LDL Cholesterol: 73 mg/dL (ref 10–99)
NonHDL: 97.49
Total CHOL/HDL Ratio: 3
Triglycerides: 120 mg/dL (ref 10.0–149.0)
VLDL: 24 mg/dL (ref 0.0–40.0)

## 2025-01-06 LAB — HEMOGLOBIN A1C: Hgb A1c MFr Bld: 6 % (ref 4.6–6.5)

## 2025-01-14 ENCOUNTER — Other Ambulatory Visit (HOSPITAL_BASED_OUTPATIENT_CLINIC_OR_DEPARTMENT_OTHER)

## 2025-01-16 ENCOUNTER — Ambulatory Visit: Admitting: Physician Assistant

## 2025-07-03 ENCOUNTER — Ambulatory Visit (HOSPITAL_BASED_OUTPATIENT_CLINIC_OR_DEPARTMENT_OTHER): Admitting: Obstetrics & Gynecology

## 2025-07-06 ENCOUNTER — Ambulatory Visit: Admitting: Family Medicine
